# Patient Record
Sex: Female | Born: 1955 | Race: White | Hispanic: No | Marital: Single | Smoking: Never smoker
Health system: Southern US, Community
[De-identification: ages and names within clinical notes are randomized; demographics above are authoritative.]

## PROBLEM LIST (undated history)

## (undated) DIAGNOSIS — N189 Chronic kidney disease, unspecified: Secondary | ICD-10-CM

## (undated) DIAGNOSIS — D509 Iron deficiency anemia, unspecified: Secondary | ICD-10-CM

## (undated) DIAGNOSIS — E079 Disorder of thyroid, unspecified: Secondary | ICD-10-CM

## (undated) DIAGNOSIS — G8929 Other chronic pain: Secondary | ICD-10-CM

## (undated) DIAGNOSIS — E785 Hyperlipidemia, unspecified: Secondary | ICD-10-CM

## (undated) DIAGNOSIS — M1990 Unspecified osteoarthritis, unspecified site: Secondary | ICD-10-CM

## (undated) HISTORY — PX: CHOLECYSTECTOMY: SHX55

## (undated) HISTORY — PX: JOINT REPLACEMENT: SHX530

## (undated) HISTORY — DX: Disorder of thyroid, unspecified: E07.9

## (undated) HISTORY — DX: Chronic kidney disease, unspecified: N18.9

## (undated) HISTORY — DX: Iron deficiency anemia, unspecified: D50.9

## (undated) HISTORY — DX: Other chronic pain: G89.29

## (undated) HISTORY — DX: Hyperlipidemia, unspecified: E78.5

## (undated) HISTORY — DX: Unspecified osteoarthritis, unspecified site: M19.90

---

## 2003-02-05 ENCOUNTER — Encounter: Payer: Self-pay | Admitting: Orthopedic Surgery

## 2003-02-09 ENCOUNTER — Encounter: Payer: Self-pay | Admitting: Orthopedic Surgery

## 2003-02-09 ENCOUNTER — Inpatient Hospital Stay (HOSPITAL_COMMUNITY): Admission: RE | Admit: 2003-02-09 | Discharge: 2003-02-12 | Payer: Self-pay | Admitting: Orthopedic Surgery

## 2006-04-27 ENCOUNTER — Inpatient Hospital Stay (HOSPITAL_COMMUNITY): Admission: EM | Admit: 2006-04-27 | Discharge: 2006-04-29 | Payer: Self-pay | Admitting: *Deleted

## 2006-04-28 ENCOUNTER — Encounter (INDEPENDENT_AMBULATORY_CARE_PROVIDER_SITE_OTHER): Payer: Self-pay | Admitting: *Deleted

## 2006-09-19 ENCOUNTER — Encounter: Admission: RE | Admit: 2006-09-19 | Discharge: 2006-09-19 | Payer: Self-pay | Admitting: *Deleted

## 2006-09-25 ENCOUNTER — Encounter (INDEPENDENT_AMBULATORY_CARE_PROVIDER_SITE_OTHER): Payer: Self-pay | Admitting: *Deleted

## 2006-09-25 ENCOUNTER — Ambulatory Visit (HOSPITAL_COMMUNITY): Admission: RE | Admit: 2006-09-25 | Discharge: 2006-09-25 | Payer: Self-pay | Admitting: *Deleted

## 2007-07-18 ENCOUNTER — Encounter: Admission: RE | Admit: 2007-07-18 | Discharge: 2007-07-18 | Payer: Self-pay | Admitting: Endocrinology

## 2007-08-10 ENCOUNTER — Emergency Department (HOSPITAL_COMMUNITY): Admission: EM | Admit: 2007-08-10 | Discharge: 2007-08-10 | Payer: Self-pay | Admitting: Emergency Medicine

## 2007-08-26 ENCOUNTER — Inpatient Hospital Stay (HOSPITAL_COMMUNITY): Admission: EM | Admit: 2007-08-26 | Discharge: 2007-08-29 | Payer: Self-pay | Admitting: Emergency Medicine

## 2008-01-10 ENCOUNTER — Encounter (INDEPENDENT_AMBULATORY_CARE_PROVIDER_SITE_OTHER): Payer: Self-pay | Admitting: *Deleted

## 2008-01-10 ENCOUNTER — Ambulatory Visit (HOSPITAL_COMMUNITY): Admission: RE | Admit: 2008-01-10 | Discharge: 2008-01-10 | Payer: Self-pay | Admitting: *Deleted

## 2008-04-08 ENCOUNTER — Encounter: Admission: RE | Admit: 2008-04-08 | Discharge: 2008-04-08 | Payer: Self-pay | Admitting: *Deleted

## 2010-07-17 ENCOUNTER — Encounter: Payer: Self-pay | Admitting: *Deleted

## 2010-08-14 ENCOUNTER — Emergency Department (HOSPITAL_COMMUNITY): Payer: Medicare Other

## 2010-08-14 ENCOUNTER — Inpatient Hospital Stay (HOSPITAL_COMMUNITY)
Admission: EM | Admit: 2010-08-14 | Discharge: 2010-08-22 | DRG: 312 | Disposition: A | Discharge status: Skilled Nursing Facility | Payer: Medicare Other | Attending: Family Medicine | Admitting: Family Medicine

## 2010-08-14 DIAGNOSIS — F70 Mild intellectual disabilities: Secondary | ICD-10-CM | POA: Diagnosis present

## 2010-08-14 DIAGNOSIS — M545 Low back pain, unspecified: Secondary | ICD-10-CM | POA: Diagnosis present

## 2010-08-14 DIAGNOSIS — R55 Syncope and collapse: Principal | ICD-10-CM | POA: Diagnosis present

## 2010-08-14 DIAGNOSIS — M810 Age-related osteoporosis without current pathological fracture: Secondary | ICD-10-CM | POA: Diagnosis present

## 2010-08-14 DIAGNOSIS — E876 Hypokalemia: Secondary | ICD-10-CM | POA: Diagnosis present

## 2010-08-14 DIAGNOSIS — E860 Dehydration: Secondary | ICD-10-CM | POA: Diagnosis present

## 2010-08-14 DIAGNOSIS — E039 Hypothyroidism, unspecified: Secondary | ICD-10-CM | POA: Diagnosis present

## 2010-08-14 DIAGNOSIS — D649 Anemia, unspecified: Secondary | ICD-10-CM | POA: Diagnosis present

## 2010-08-14 DIAGNOSIS — R634 Abnormal weight loss: Secondary | ICD-10-CM | POA: Diagnosis present

## 2010-08-14 DIAGNOSIS — N179 Acute kidney failure, unspecified: Secondary | ICD-10-CM | POA: Diagnosis present

## 2010-08-14 DIAGNOSIS — D696 Thrombocytopenia, unspecified: Secondary | ICD-10-CM | POA: Diagnosis present

## 2010-08-14 DIAGNOSIS — E785 Hyperlipidemia, unspecified: Secondary | ICD-10-CM | POA: Diagnosis present

## 2010-08-14 LAB — CK TOTAL AND CKMB (NOT AT ARMC)
CK, MB: 17.7 ng/mL (ref 0.3–4.0)
Relative Index: 4.5 — ABNORMAL HIGH (ref 0.0–2.5)
Total CK: 420 U/L — ABNORMAL HIGH (ref 7–177)
Total CK: 395 U/L — ABNORMAL HIGH (ref 7–177)

## 2010-08-14 LAB — URINALYSIS, ROUTINE W REFLEX MICROSCOPIC
Hgb urine dipstick: NEGATIVE
Ketones, ur: 40 mg/dL — AB
Nitrite: NEGATIVE
Protein, ur: NEGATIVE mg/dL
Urine Glucose, Fasting: NEGATIVE mg/dL
Urobilinogen, UA: 0.2 mg/dL (ref 0.0–1.0)
pH: 6 (ref 5.0–8.0)

## 2010-08-14 LAB — DIFFERENTIAL
Eosinophils Absolute: 0 10*3/uL (ref 0.0–0.7)
Eosinophils Relative: 0 % (ref 0–5)
Lymphocytes Relative: 14 % (ref 12–46)
Lymphs Abs: 1 10*3/uL (ref 0.7–4.0)

## 2010-08-14 LAB — TROPONIN I: Troponin I: 0.09 ng/mL — ABNORMAL HIGH (ref 0.00–0.06)

## 2010-08-14 LAB — COMPREHENSIVE METABOLIC PANEL
AST: 83 U/L — ABNORMAL HIGH (ref 0–37)
Albumin: 4.2 g/dL (ref 3.5–5.2)
Calcium: 9.5 mg/dL (ref 8.4–10.5)
GFR calc Af Amer: 51 mL/min — ABNORMAL LOW (ref >60)
GFR calc non Af Amer: 42 mL/min — ABNORMAL LOW (ref >60)
Potassium: 2.9 mEq/L — ABNORMAL LOW (ref 3.5–5.1)

## 2010-08-14 LAB — POCT CARDIAC MARKERS: CKMB, poc: 9.8 ng/mL (ref 1.0–8.0)

## 2010-08-14 LAB — SAMPLE TO BLOOD BANK

## 2010-08-14 LAB — PROTIME-INR
INR: 1.07 (ref 0.00–1.49)
Prothrombin Time: 14.1 seconds (ref 11.6–15.2)

## 2010-08-14 LAB — RAPID URINE DRUG SCREEN, HOSP PERFORMED
Cocaine: NOT DETECTED
Opiates: NOT DETECTED

## 2010-08-14 LAB — CBC
HCT: 32.1 % — ABNORMAL LOW (ref 36.0–46.0)
MCH: 30 pg (ref 26.0–34.0)
MCHC: 35.8 g/dL (ref 30.0–36.0)
RBC: 3.83 MIL/uL — ABNORMAL LOW (ref 3.87–5.11)
RDW: 12.9 % (ref 11.5–15.5)

## 2010-08-15 ENCOUNTER — Inpatient Hospital Stay (HOSPITAL_COMMUNITY): Payer: Medicare Other

## 2010-08-15 LAB — CBC
HCT: 26.1 % — ABNORMAL LOW (ref 36.0–46.0)
MCHC: 34.5 g/dL (ref 30.0–36.0)
Platelets: 133 10*3/uL — ABNORMAL LOW (ref 150–400)
RDW: 13.2 % (ref 11.5–15.5)
WBC: 5.5 10*3/uL (ref 4.0–10.5)

## 2010-08-15 LAB — COMPREHENSIVE METABOLIC PANEL
ALT: 56 U/L — ABNORMAL HIGH (ref 0–35)
AST: 67 U/L — ABNORMAL HIGH (ref 0–37)
Albumin: 3 g/dL — ABNORMAL LOW (ref 3.5–5.2)
Calcium: 8.1 mg/dL — ABNORMAL LOW (ref 8.4–10.5)
Chloride: 118 mEq/L — ABNORMAL HIGH (ref 96–112)
Creatinine, Ser: 1.01 mg/dL (ref 0.4–1.2)
GFR calc Af Amer: >60 mL/min (ref >60)
Sodium: 143 mEq/L (ref 135–145)

## 2010-08-15 LAB — BRAIN NATRIURETIC PEPTIDE: Pro B Natriuretic peptide (BNP): 246 pg/mL — ABNORMAL HIGH (ref 0.0–100.0)

## 2010-08-15 LAB — CARDIAC PANEL(CRET KIN+CKTOT+MB+TROPI)
CK, MB: 11.9 ng/mL (ref 0.3–4.0)
Relative Index: 3.6 — ABNORMAL HIGH (ref 0.0–2.5)
Relative Index: 3.2 — ABNORMAL HIGH (ref 0.0–2.5)
Total CK: 395 U/L — ABNORMAL HIGH (ref 7–177)
Troponin I: 0.37 ng/mL — ABNORMAL HIGH (ref 0.00–0.06)
Troponin I: 0.19 ng/mL — ABNORMAL HIGH (ref 0.00–0.06)

## 2010-08-15 LAB — IRON AND TIBC
Iron: 69 ug/dL (ref 42–135)
TIBC: 248 ug/dL — ABNORMAL LOW (ref 250–470)

## 2010-08-15 LAB — FERRITIN: Ferritin: 338 ng/mL — ABNORMAL HIGH (ref 10–291)

## 2010-08-15 LAB — PHOSPHORUS: Phosphorus: 1.2 mg/dL — ABNORMAL LOW (ref 2.3–4.6)

## 2010-08-15 LAB — URINE CULTURE
Colony Count: NO GROWTH
Culture  Setup Time: 201202192344
Culture: NO GROWTH

## 2010-08-15 LAB — LIPID PANEL
Triglycerides: 98 mg/dL (ref <150)
VLDL: 20 mg/dL (ref 0–40)

## 2010-08-15 LAB — FOLATE: Folate: >20 ng/mL

## 2010-08-15 LAB — TSH: TSH: 8.219 u[IU]/mL — ABNORMAL HIGH (ref 0.350–4.500)

## 2010-08-16 ENCOUNTER — Inpatient Hospital Stay (HOSPITAL_COMMUNITY): Payer: Medicare Other

## 2010-08-16 LAB — COMPREHENSIVE METABOLIC PANEL
AST: 63 U/L — ABNORMAL HIGH (ref 0–37)
CO2: 20 mEq/L (ref 19–32)
Calcium: 7.9 mg/dL — ABNORMAL LOW (ref 8.4–10.5)
Creatinine, Ser: 0.81 mg/dL (ref 0.4–1.2)
GFR calc Af Amer: >60 mL/min (ref >60)
GFR calc non Af Amer: >60 mL/min (ref >60)
Total Protein: 5.3 g/dL — ABNORMAL LOW (ref 6.0–8.3)

## 2010-08-16 LAB — CARDIAC PANEL(CRET KIN+CKTOT+MB+TROPI)
CK, MB: 7.4 ng/mL (ref 0.3–4.0)
Relative Index: 3 — ABNORMAL HIGH (ref 0.0–2.5)

## 2010-08-16 LAB — CBC
Hemoglobin: 9.4 g/dL — ABNORMAL LOW (ref 12.0–15.0)
MCH: 30.4 pg (ref 26.0–34.0)
MCHC: 33.8 g/dL (ref 30.0–36.0)
Platelets: 139 10*3/uL — ABNORMAL LOW (ref 150–400)
RDW: 13.9 % (ref 11.5–15.5)

## 2010-08-16 LAB — CLOSTRIDIUM DIFFICILE BY PCR: Toxigenic C. Difficile by PCR: NEGATIVE

## 2010-08-16 MED ORDER — IOHEXOL 300 MG/ML  SOLN
80.0000 mL | Freq: Once | INTRAMUSCULAR | Status: AC | PRN
  Administered 2010-08-16: 80 mL via INTRAVENOUS

## 2010-08-17 ENCOUNTER — Inpatient Hospital Stay (HOSPITAL_COMMUNITY): Payer: Medicare Other

## 2010-08-17 LAB — COMPREHENSIVE METABOLIC PANEL
ALT: 73 U/L — ABNORMAL HIGH (ref 0–35)
AST: 70 U/L — ABNORMAL HIGH (ref 0–37)
Albumin: 2.9 g/dL — ABNORMAL LOW (ref 3.5–5.2)
Alkaline Phosphatase: 90 U/L (ref 39–117)
CO2: 23 mEq/L (ref 19–32)
Chloride: 112 mEq/L (ref 96–112)
GFR calc Af Amer: >60 mL/min (ref >60)
GFR calc non Af Amer: >60 mL/min (ref >60)
Potassium: 3.9 mEq/L (ref 3.5–5.1)
Total Bilirubin: 0.4 mg/dL (ref 0.3–1.2)

## 2010-08-17 LAB — FERRITIN: Ferritin: 286 ng/mL (ref 10–291)

## 2010-08-17 LAB — IRON AND TIBC
Iron: 109 ug/dL (ref 42–135)
TIBC: 222 ug/dL — ABNORMAL LOW (ref 250–470)

## 2010-08-17 LAB — T3, FREE: T3, Free: 1.9 pg/mL — ABNORMAL LOW (ref 2.3–4.2)

## 2010-08-17 LAB — CROSSMATCH
ABO/RH(D): A POS
Antibody Screen: NEGATIVE

## 2010-08-17 LAB — VITAMIN B12: Vitamin B-12: 1223 pg/mL — ABNORMAL HIGH (ref 211–911)

## 2010-08-17 LAB — CBC
Hemoglobin: 11.2 g/dL — ABNORMAL LOW (ref 12.0–15.0)
Platelets: 147 10*3/uL — ABNORMAL LOW (ref 150–400)
RBC: 3.77 MIL/uL — ABNORMAL LOW (ref 3.87–5.11)
WBC: 6 10*3/uL (ref 4.0–10.5)

## 2010-08-17 LAB — FOLATE: Folate: >20 ng/mL

## 2010-08-17 NOTE — Consult Note (Signed)
Shawna [name], Shawna [name]                  ACCOUNT NO.:  [id number]  MEDICAL RECORD NO.:  [id number]           PATIENT TYPE:  I  LOCATION:  [id number]                         FACILITY:  [facility]  PHYSICIAN:  Mali Ariana Cavenaugh, MD         DATE OF BIRTH:  10-22-1955  DATE OF CONSULTATION:  08/14/2010 DATE OF DISCHARGE:  08/14/2010                                CONSULTATION   HISTORY OF PRESENT ILLNESS:  Shawna [name] is a 55 year old female who is followed by Dr. Wilson Singer.  She has no prior history of coronary disease and no prior history of any cardiac workup.  We are asked to see her today for syncope with positive cardiac markers.  Ms. Stukey was admitted on August 14, 2010 after syncope and collapse at home.  It appears she is dehydrated.  She is mentally retarded, her last MRI in 2009 showed advanced nonspecific volume loss.  She has not been eating or drinking well for the last several days.  The nurse notes since she has been in the hospital, she has also had 3 episodes of diarrhea.  Her hemoglobin was 11.5 on admission and dropped to 9.0 with hydration.  BUN was 31, creatinine 1.32.  The patient denies any chest pain or palpitations or shortness of breath.  It is interesting to note that her initial point- of-care markers were negative x2, her CK was 420 with 19 MBs, and subsequent troponins were 0.09 and 0.37.  An EKG has not been done yet. The patient was documented to have orthostatic blood pressure changes.  PAST MEDICAL HISTORY:  Remarkable for mild mental retardation as noted above.  She has DJD.  She has had hip surgery done on the right twice, 1988 with a revision in August 2004.  She has DJD in her back and has chronic back pain.  She has a history of AVMs and polyps, her last endoscopy and colonoscopy was in July 2009 by Dr. Lajoyce Corners.  She was admitted in 2009 in the spring with similar episode of orthostatic hypotension, at that time she was put on Florinef.  She has had a history of  prior outpatient pneumonia treated in March 2009.  She has treated hypothyroidism.  She had laparoscopic cholecystectomy in November 2007.  MEDICATIONS:  Her home medications according to the HPI are 1. Celebrex 200 mg a day. 2. Sertraline p.r.n. 3. Synthroid 0.075 mg a day. 4. Tramadol.  ALLERGIES:  She has no known drug allergies.  SOCIAL HISTORY:  She has no children.  She does not smoke.  She tells me that she lives with her brother and his wife.  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  FAMILY HISTORY:  Unknown.  REVIEW OF SYSTEMS:  According to the history and physical, she lost 13 pounds in the last month.  She denies any melena, fever, or chills.  She has not had hemoptysis.  She has not had a history of blood clots.  PHYSICAL EXAMINATION:  VITAL SIGNS:  Blood pressure lying flat is 92/58, pulse 88, temperature 98.9.  GENERAL:  She is a pale, thin, chronically ill-appearing female in no acute distress. HEENT:  Normocephalic.  She has poor dentition. NECK:  Without JVD or bruit.  Thyroid is not enlarged. CHEST:  Reveals pectus.  She has diminished breath sounds, but no rales. CARDIAC:  Diminished heart sounds with regular rate and rhythm.  No obvious murmur, rub, or gallop. ABDOMEN:  Nontender, nondistended.  No obvious hepatosplenomegaly. EXTREMITIES:  Without edema.  Distal pulses are faint, but intact. NEUROLOGIC:  Exam is grossly intact.  She is awake, alert, oriented, and cooperative and answers questions appropriately. SKIN:  Cool and dry.  LABORATORY DATA:  White count 5.5, hemoglobin is 9.0, hematocrit 26.1, platelets 133 after hydration.  On admission, her sodium was 136, potassium 2.9, BUN 31, creatinine 1.32.  LFTs were elevated on admission at 83 and 75.  Point-of-care markers as noted are negative x2, but her CK MBs peaked at 420 with 19.8 MB with a relative index of 4.7 and subsequent troponin Is were 0.09 and 0.37.   Total cholesterol is 214 with a LD of 149.  There is no EKG available at this time.  A CT of her abdomen revealed nonspecific bowel gas pattern, no free air.  IMPRESSION: 1. Orthostatic syncope. 2. Dehydration. 3. Elevated cardiac markers of unclear significance. 4. Dyslipidemia. 5. Mild mental retardation 6. Degenerative joint disease with bilateral hip surgeries x2 and     chronic back pain. 7. History of arteriovenous malformation and polyps, colonoscopy and     endoscopy were done last in July 2009. 8. Treated hypothyroidism.  PLAN:  The patient will be seen by Dr. Debara Pickett, further recommendations to follow.  We will see if we can find her emergency room EKG if not repeated.  We will also repeat a BNP, I believe this has already been ordered.  Echocardiogram will be ordered as well.     Erlene Quan, P.A.   ______________________________ Mali Haedyn Breau, MD    LKK/MEDQ  D:  08/15/2010  T:  08/15/2010  Job:  [id number]  cc:   Mali Sharay Bellissimo, MD Ronaldo Miyamoto, M.D.  Electronically Signed by Kerin Ransom P.A. on 08/15/2010 03:33:29 PM Electronically Signed by Raliegh Ip. Wavie Hashimi M.D. on 08/17/2010 08:04:37 AM

## 2010-08-18 LAB — COMPREHENSIVE METABOLIC PANEL
Alkaline Phosphatase: 92 U/L (ref 39–117)
BUN: 5 mg/dL — ABNORMAL LOW (ref 6–23)
Creatinine, Ser: 0.8 mg/dL (ref 0.4–1.2)
Glucose, Bld: 97 mg/dL (ref 70–99)
Potassium: 3.9 mEq/L (ref 3.5–5.1)
Total Protein: 5.3 g/dL — ABNORMAL LOW (ref 6.0–8.3)

## 2010-08-18 LAB — CBC
HCT: 34.3 % — ABNORMAL LOW (ref 36.0–46.0)
MCH: 29.9 pg (ref 26.0–34.0)
MCHC: 34.4 g/dL (ref 30.0–36.0)
MCV: 87.1 fL (ref 78.0–100.0)
RDW: 14.7 % (ref 11.5–15.5)
WBC: 7.1 10*3/uL (ref 4.0–10.5)

## 2010-08-19 ENCOUNTER — Inpatient Hospital Stay (HOSPITAL_COMMUNITY): Payer: Medicare Other

## 2010-08-20 ENCOUNTER — Inpatient Hospital Stay (HOSPITAL_COMMUNITY): Payer: Medicare Other

## 2010-08-20 LAB — COMPREHENSIVE METABOLIC PANEL
ALT: 68 U/L — ABNORMAL HIGH (ref 0–35)
AST: 49 U/L — ABNORMAL HIGH (ref 0–37)
CO2: 30 mEq/L (ref 19–32)
Calcium: 8.2 mg/dL — ABNORMAL LOW (ref 8.4–10.5)
GFR calc Af Amer: >60 mL/min (ref >60)
GFR calc non Af Amer: >60 mL/min (ref >60)
Potassium: 3 mEq/L — ABNORMAL LOW (ref 3.5–5.1)
Sodium: 142 mEq/L (ref 135–145)
Total Protein: 5.5 g/dL — ABNORMAL LOW (ref 6.0–8.3)

## 2010-08-21 LAB — COMPREHENSIVE METABOLIC PANEL
ALT: 68 U/L — ABNORMAL HIGH (ref 0–35)
AST: 62 U/L — ABNORMAL HIGH (ref 0–37)
CO2: 25 mEq/L (ref 19–32)
Calcium: 8.6 mg/dL (ref 8.4–10.5)
Chloride: 105 mEq/L (ref 96–112)
GFR calc Af Amer: >60 mL/min (ref >60)
GFR calc non Af Amer: >60 mL/min (ref >60)
Potassium: 3.5 mEq/L (ref 3.5–5.1)
Sodium: 141 mEq/L (ref 135–145)

## 2010-09-01 NOTE — H&P (Signed)
Shawna [name], Shawna [name]                  ACCOUNT NO.:  [id number]  MEDICAL RECORD NO.:  [id number]           PATIENT TYPE:  E  LOCATION:  [facility]                         FACILITY:  [facility]  PHYSICIAN:  Barbette Merino, M.D.      DATE OF BIRTH:  Jun 22, 1956  DATE OF ADMISSION:  08/14/2010 DATE OF DISCHARGE:  08/14/2010                             HISTORY & PHYSICAL   PRIMARY CARE PHYSICIAN:  Dr. Anda Kraft.  PRESENTING COMPLAINT:  Passing out.  HISTORY OF PRESENT ILLNESS:  The patient is a 55 year old female with history of degenerative disk disease and osteoporosis, who has apparently been doing all right until about 4 days ago when she started having excessive back pain.  She was trying to get her orthopedic surgeon but before she could get there she started feeling weak and dizzy.  The patient has stopped eating or drinking in the last couple of days.  She passed out today, hence she was brought in by her family. She has had multiple episodes of syncopal episode in the past.  Her last admission was in March 2009 at which point she was orthostatic and was started on fludrocortisone.  Since then she has been doing fine until this event.  Per family, unable to stand after she started having the back pain.  In the ED, she was found to be orthostatic as well.  The patient also was reported to have loss of 13 pounds in the last 1 month. Denied any fever, she had 1 episode of vomiting, but no diarrhea. Denied any pain besides the back pain.  Her passing out today with abrupt.  As she has been dizzy.  No further episodes have happened since that one episode.  She is, however, still dizzy when she gets up.  Per family when she passed out she actually fell on the floor.  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  History of pneumonia, degenerative disk disease, multiple colon polyps, last colonoscopy reported in 2009, history of AVM of the stomach, chronic normocytic anemia.  She is status post laparoscopic cholecystectomy in November 2007 and status post total right hip replacement also twice before.  ALLERGIES:  She has no known drug allergies.  CURRENT MEDICATIONS: 1. Celebrex 200 mg daily. 2. Sertraline 100 mg p.r.n. 3. Synthroid 75 mcg daily. 4. Tramadol 50 mg daily.  SOCIAL HISTORY:  The patient lives in [place], alone.  She is single.  She has siblings live with her in the same property.  Her brother is here with her, his name is Markeshia Manbeck and he is the primary care giver.  He is here with his wife, telephone number of [phone]. She has mild mental retardation, but there was able to complete high school, currently unemployed.  No tobacco, alcohol, or IV drug use.  FAMILY HISTORY:  She is adopted and therefore full history is unknown.  REVIEW OF SYSTEMS:  All her systems reviewed  and are currently negative except per HPI.  PHYSICAL EXAMINATION:  VITAL SIGNS:  Temperature is 97.8, blood pressure initially 129/73 with a pulse of 95, respiratory rate 16, sats 100% on room air.  Orthostatics blood pressure, systolic dropping from [id number] from lying to sitting, diastolic from 67 to 37 .  Her pulse rate jumped from 95-108. GENERAL:  The patient is looks chronically ill-looking, small frame, cachectic, but no obvious acute distress. HEENT:  PERRL.  EOMI.  She has sunken eyes with dry mucous membranes. NECK:  Supple.  No visible JVD.  No lymphadenopathy. RESPIRATORY:  She has good air entry bilaterally.  No wheezes or rales. CARDIOVASCULAR SYSTEM:  She has S1 and S2, no audible murmur. ABDOMEN:  Scaphoid, nontender with positive bowel sounds. EXTREMITIES:  She has no edema, cyanosis, or  clubbing. MUSCULOSKELETAL:  The patient has a very tender spot in the mid back. She also has evidence of arthritis in her hips. SKIN:  No visible rashes or ulcers.  LABORATORY DATA:  Her white count is 7.0, hemoglobin 11.5 with an MCV of 83.8.  Her platelet count is 193.  Sodium 136, potassium 2.9, chloride 102, CO2 of 22, glucose 91, BUN 31, creatinine 1.32 with a decreased GFR of 51.  Total bilirubin is 0.6, alkaline phosphatase is 127, AST 83, ALT 75, total protein 7.3, albumin 4.2, calcium 9.5.  Her CK is 420 with an MB of 19.8, index of 4.7, but troponin I was negative.  Urinalysis also negative except for some ketones.  Her urine drug screen is negative. PT is 14.1, INR 1.07, PTT of 26.  Abdominal x-ray showed no obstructive bowel gas pattern.  No free air.  No acute cardiopulmonary disease.  ASSESSMENT:  This is a 55 year old female presenting with syncopal episode and is very orthostatic.  It appears the patient may be dehydrated.  She has an acute renal insufficiency pattern.  All of this may have been related to poor oral intake as a result of her ongoing pain.  There was supposed autonomic dysfunction 3 years ago which could still be playing a role.  PLAN: 1. Syncope.  We will admit the patient and hydrate her aggressively,     recheck orthostatics in the morning on daily.  I will check     cortisol level in a.m.  Once she is fully hydrated and if she is     orthostatic, we will restart Florinef.  I will check her thyroid     function as well to make sure this may not be responsible for her     symptoms. 2. Hypokalemia.  Again the cause is not entirely clear but may be     dehydration.  She denied any nausea or vomiting.  We may have to     look at autonomic cause as well.  I will replete her potassium and     add potassium to her IV fluids. 3. Osteoporosis.  The patient has been diagnosed with osteoporosis,     although I do not have any T-score at this point.  But we  will     handle that with care with fall precaution to avoid further     fracture. 4. Degenerative disk disease.  She has a very tender spot at the back     which may reflect a site of some compression fracture which may be     the cause of her pain.  I will check an MRI of her lumbar spine  including the upper thoracic spine.  Depending on what the MRI     shows we will proceed with further treatment. 5. Increased LFTs, probably from dehydration and starvation.  We will     follow her liver function closely. 6. Mild rhabdo, her CPK is elevated probably from the fall.  We will     hydrate her and follow her CPK levels. 7. Chronic anemia probably from thyroid disease on poor oral intake     and I will check anemia panel. 8. Weight loss.  Again this may be related to her poor oral intake.     We will follow the patient closely and get PT, OT and other care.     Hopefully another consult for home care.  Hopefully, the patient     will be able to start eating adequately.     Barbette Merino, M.D.     [place]  D:  08/14/2010  T:  08/14/2010  Job:  [id number]  Electronically Signed by Barbette Merino M.D. on 09/01/2010 06:31:00 AM

## 2010-09-01 NOTE — Discharge Summary (Signed)
  Shawna [name], Shawna [name]                  ACCOUNT NO.:  [id number]  MEDICAL RECORD NO.:  [id number]           PATIENT TYPE:  I  LOCATION:  [id number]                         FACILITY:  [facility]  PHYSICIAN:  Murray Hodgkins, MD    DATE OF BIRTH:  1955/09/03  DATE OF ADMISSION:  08/14/2010 DATE OF DISCHARGE:  08/14/2010                              DISCHARGE SUMMARY   ADDENDUM:  DISCHARGE MEDICATIONS:  Clarification on #6 of original dictation. Should be oxycodone sustained release 10 mg p.o. every 12 hours.  Original dictation includes potassium biphosphate 250 mg p.o. b.i.d. x6 doses, last dose to be on August 22, 2010, at 10 p.m.  We will discontinue this medication.     Shawna Gunning, NP   ______________________________ Murray Hodgkins, MD    KMB/MEDQ  D:  08/22/2010  T:  08/22/2010  Job:  [id number]  Electronically Signed by Murray Hodgkins  on 08/31/2010 09:10:55 PM

## 2010-09-01 NOTE — Discharge Summary (Signed)
Shawna [name], Shawna [name]                  ACCOUNT NO.:  [id number]  MEDICAL RECORD NO.:  [id number]           PATIENT TYPE:  LOCATION:                                 FACILITY:  PHYSICIAN:  Murray Hodgkins, MD    DATE OF BIRTH:  27-Oct-1955  DATE OF ADMISSION:  08/14/2010 DATE OF DISCHARGE:  08/22/2010                              DISCHARGE SUMMARY   PRIMARY CARE PROVIDER:  Ronaldo Miyamoto, MD  DISCHARGE DIAGNOSES: 1. Syncope secondary to orthostasis. 2. Acute on chronic orthostasis. 3. Thoracic back pain. 4. Mild elevation of AST/ALT. 5. Acute renal failure. 6. Status post elevated cardiac enzymes. 7. Hypokalemia. 8. Hypophosphatemia.  DISCHARGE MEDICATIONS: 1. Acetaminophen 650 mg p.o. every 4 hours as needed. 2. Aspirin 81 mg p.o. daily. 3. Florinef 0.1 mg p.o. b.i.d. 4. Levothyroxine 100 mcg p.o. daily. 5. Magnesium oxide 400 mg p.o. b.i.d. x6 doses, last dose on August 24, 2010, at 10 a.m. 6. Oxycodone p.o. every 12 hours. 7. Oxycodone IR 10 mg p.o. every 4 hours as needed for severe pain. 8. Potassium biphosphate 250 mg p.o. b.i.d. x6 doses, last dose on     August 22, 2010, at 10 p.m. 9. Potassium chloride 40 mEq p.o. daily. 10.Calcium carbonate/vitamin D over-the-counter 1 tablet p.o. b.i.d. 11.Multivitamin 1 tablet p.o. daily.  LABORATORY DATA:  On August 14, 2010, WBC 7.0, hemoglobin 11.5, hematocrit 32.1, neutrophils 78%, absolute neutrophils 5.5.  Sodium 136, potassium 2.9, chloride 102, CO2 of 22, BUN 31, creatinine 1.32, glucose 91, alk phos 127, AST 83, ALT 75.  PTT 26, PT 14.1, INR 1.07.  CK-MB 15.0, troponin I less than 0.05, myoglobin greater than 500.  Urinalysis negative.  Urine drug screen negative.  Total CK 420, CK-MB 19.8, troponin-I 0.09.  Next set CK-MB 9.8, troponin I less than 0.05, myoglobin greater than 500.  Cholesterol 214, LDL 149.  Total CK 395, CK- MB 14.3, and troponin-I 0.37 on August 15, 2010.  Cortisol 16.7.  TSH 8.21.  TIBC  248, vitamin B12 of 1500, folate greater than 20.0 and ferritin 338.  Total CK 368, CK-MB 11.9, and troponin-I 0.25. Phosphorus 1.2, magnesium 1.5.  BNP 246.  Urine culture showed no growth.  C. difficile by PCR negative.  On August 21, 2010, sodium was 141, potassium 3.5, chloride 105, CO2 of 25, BUN 7, creatinine 0.82, glucose 91, alk phos 119, AST 62, ALT 68, total protein 5.3, albumin 2.9, magnesium 1.4 on August 21, 2010.  Phosphorus 3.6 on August 21, 2010.  RADIOLOGICAL DATA:  Abdominal x-ray of August 14, 2010, showed nonobstructive bowel gas pattern, no free air.  No acute cardiopulmonary disease.  CT of the abdomen and pelvis with contrast on August 16, 2010, yields no evidence of abdominal aortic aneurysm.  No acute abdominal/pelvic findings.  Thoracic spine, 2 view, on August 18, 2010, was negative.  MRI of thoracic spine without contrast, normal examination of thoracic spine.  No traumatic findings.  No cause of back pain identified.  CT of the head without contrast done on August 19, 2010, stable advanced cerebral volume loss.  No acute intracranial abnormality.  Abdominal ultrasound on August 21, 2010, yields no acute findings, status post cholecystectomy.  PROCEDURES:  Two-D echocardiogram yields an ejection fraction of 60-65%. Left ventricular diastolic function parameters were normal.  CONSULTS:  Mali Hilty, MD, with Cardiology.  BRIEF SUMMARY:  Shawna [name] is a 55 year old female with a history of degenerative disk disease and osteoporosis, who developed worsening/excessive back pain around August 10, 2010.  She came to the emergency room on August 14, 2010, and reported that she was trying to get an orthopedic surgeon for an appointment, but before she could get that accomplished she began feeling weak and dizzy.  The patient reported that she had stopped eating and drinking for the last 24-48 hours secondary to the pain.  On August 14, 2010, she  reported to the emergency room physician that she had passed out and fell.  She was brought in by her family.  Of note, she has had multiple episodes of syncope in the past.  Her last admission was March 2009, at which point she was orthostatic and was started on fludrocortisone.  Since then, she had been doing fine until this event.  Per family, she was unable to stand after she started having the back pain.  Family also reported a 13- pound weight loss in the last month.  The patient was admitted to telemetry floor for further evaluation and treatment of syncope, hypokalemia, and back pain.  HOSPITAL COURSE BY PROBLEM: 1. Syncope secondary to orthostasis:  Currently resolved.  The patient     was admitted to telemetry floor, was given IV fluids.  She had a 2-     D echo as noted above.  Cardiology consult was obtained.  The     patient monitored on telemetry and remained in sinus rhythm.     Orthostatics confirmed orthostasis.  The patient was continued on     Florinef.  She had PT and OT.  Orthostatics improved with fluids.     The patient had no syncopal events during her hospitalization.     Recommending that she ambulate with assistance only.  If condition     persists or worsens, may consider adding midodrine. 2. Acute on chronic orthostasis:  See above.  Ambulate with assistance     only.  Continue Florinef.  Other options to consider midodrine. 3. Thoracic back pain:  Nonfocal neurological exam.  Imaging was     negative.  The patient continued to complain of thoracic back pain     during her hospitalization, although the pain did not deter her     mobility.  Treatment is pain control and PT.  The patient has no     cervical or lumbar pain. 4. Mild elevation of AST/ALT:  Currently stable, chronic intermittent.     Ultrasound of the abdomen was negative.  Not on statin or any meds. 5. Acute renal failure, resolved. 6. Status post elevated cardiac enzymes:  The patient  received     cardiology consult who recommended medical treatment.  The patient     cannot tolerate beta-blocker secondary to blood pressure.  Continue     aspirin. 7. Hypokalemia:  The patient's potassium was repleted and is within     the limits of normal upon discharge. 8. Hypophosphatemia earlier on admission:  The patient received     repletion during hospitalization.  At the time of discharge,     phosphorus level 3.6.  PHYSICAL EXAMINATION:  GENERAL:  Awake and  alert, in no acute distress. CARDIOVASCULAR:  Regular rate and rhythm.  No murmur, gallop, or rub. No lower extremity edema. RESPIRATORY:  Breath sounds clear to auscultation bilaterally.  No rhonchi, wheezes, or rales. ABDOMEN:  Flat and soft.  Positive bowel sounds throughout.  Nontender to palpation. MUSCULOSKELETAL:  Moves all extremities.  Full range of motion.  The patient's mobility is not impaired by her thoracic back pain.  The patient does experience tenderness on exam in the thoracic spine, right of midline about T4.  DISPOSITION:  The patient is being discharged to [facility].  We will continue PT and pain control for her back pain.  CONDITION AT DISCHARGE:  The patient is medically stable and ready for discharge to facility.  TIME SPENT:  45 minutes.     Radene Gunning, NP   ______________________________ Murray Hodgkins, MD    KMB/MEDQ  D:  08/22/2010  T:  08/22/2010  Job:  [id number]  cc:   Ronaldo Miyamoto, M.D.  Electronically Signed by Murray Hodgkins  on 08/31/2010 09:10:51 PM

## 2010-11-08 NOTE — Discharge Summary (Signed)
Shawna [name], Shawna [name]                  ACCOUNT NO.:  [id number]   MEDICAL RECORD NO.:  [id number]          PATIENT TYPE:  INP   LOCATION:  [id number]                         FACILITY:  [facility]   PHYSICIAN:  Rexene Alberts, M.D.    DATE OF BIRTH:  [birth year]   DATE OF ADMISSION:  08/26/2007  DATE OF DISCHARGE:  08/29/2007                               DISCHARGE SUMMARY   DISCHARGE DIAGNOSES:  1. Syncope secondary to orthostatic hypotension.  2. 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  3. 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  4. Hyponatremia, improved with intravenous fluids.  5. Hypothyroidism.  6. Osteoporosis.  7. Chronic low back pain  8. Mild mental retardation.   DISCHARGE MEDICATIONS:  1. Fludrocortisone 0.1 mg b.i.d., can be titrated accordingly to      decrease orthostatic hypotension.  2. Ultram 25 mg every 6 hours p.r.n. for pain (do not titrate up to 50      mg because of an increase in dizziness).  3. Calcium with vitamin D twice daily (the patient may have difficulty      tolerating the large pill).  4. Senokot-S 1 tablet nightly.  5. Synthroid 75 mcg daily.  6. Multivitamin once daily.  7. Ferrous sulfate 325 mg once daily.  8. Ensure supplement t.i.d.   DISCHARGE DISPOSITION:  The patient is being discharged to [facility]  skilled nursing facility today for further rehabilitation.  She is  currently stable and in no acute distress.   CONSULTATIONS:  1. Physical therapist.  2. Occupational therapist.   PROCEDURES PERFORMED:  1. MRI of the brain on August 26, 2007.  The results revealed no acute      intracranial abnormality.  Very advanced nonspecific cerebellar      volume loss for age.  Mild generalized volume loss and nonspecific      cerebral white matter disease.  Mild paranasal sinus inflammatory      changes.  2. Chest x-ray on August 26, 2007.  The results revealed no active  cardiopulmonary disease.   HISTORY OF PRESENT ILLNESS:  The patient is a 55 year old woman with a  past medical history significant for hypothyroidism, mild mental  retardation, and chronic low back pain.  She presented to the emergency  department on August 26, 2007, after experiencing a syncopal episode at  home.  When the patient was evaluated in the emergency department, her  blood pressure was found to be low at 84/60.  The patient was therefore  admitted for further evaluation and management.   For additional details, please see the dictated history and physical.   HOSPITAL COURSE:  1. Syncope, orthostatic hypotension, disequilibrium, hyponatremia, and      cerebellar atrophy.  As indicated above, the patient was      hypotensive at the time of the initial hospital assessment.  The      patient did not appear to be septic as her white blood cell count      was within normal limits and she was afebrile.  The patient,      however, did appear to be a little volume depleted.  In addition,      the patient's serum sodium was found to be low at 124.  The patient      was therefore started on volume repletion with normal saline.  For      further evaluation, a number of lab studies were ordered including      a serum cortisol, TSH, vitamin B12, uric acid, urine sodium, and      urine osmolality.  The patient's random cortisol level was 9.3,      within normal limits.  The fasting cortisol level the following      morning was also within normal limits at 7.5.  Her TSH was within      normal limits at 0.63.  Her vitamin B12 was 1310.  Her urine      osmolality was 565 and her urine sodium was 29.  The uric acid was      5, within normal limits.  For further evaluation, an MRI of the      brain was ordered.  The MRI revealed advanced atrophy of the      cerebellum.  There was no evidence of an acute stroke or mass.      Over the course of the hospitalization, orthostatic vital signs       were ordered for additional evaluation.  The patient was found to      be profoundly orthostatic with her systolic blood pressure      decreasing at least 30-40 mmHg when she stood up.  For this reason,      Florinef was added for treatment.  Initially it was started at a      once-daily dosing.  However, it has been increased to b.i.d.  The      physical and occupational therapists evaluated the patient and      recommended short-term skilled nursing facility placement for      strengthening and for equilibrium training.  These recommendations      were discussed with the patient's sister, Shawna [name], who      wholeheartedly agreed with short-term skilled nursing facility      placement.  Following volume repletion, the patient's serum sodium      did improve to 130.  She will be therefore discharged to [place] facility today.  Of note, more than likely, the      patient's disequilibrium and syncope were secondary to orthostatic      hypotension, hyponatremia, and cerebellar atrophy.  Given the      severe cerebellar atrophy, the patient may have autonomic      dysfunction.  2. Generalized anorexia and history of weight loss.  The patient was      evaluated by the registered dietician.  The dietician recommended      supplemental Ensure and ongoing vitamin therapy.  The patient      apparently ate fairly well during the hospital course.  She was      assessed for infection and her urinalysis was essentially negative      and her chest x-ray revealed no acute cardiopulmonary findings.  3. Chronic low back pain.  The patient was maintained on as-needed      Ultram during the hospital course.  Because Ultram can cause      dizziness, the dose should not be titrated upward as it  can      exacerbate or increase the risk of syncope.      Rexene Alberts, M.D.  Electronically Signed     DF/MEDQ  D:  08/29/2007  T:  08/29/2007  Job:  [id number]   cc:   Ronaldo Miyamoto, M.D.

## 2010-11-08 NOTE — Op Note (Signed)
NAMEJESSYCA, Shawna [name]                  ACCOUNT NO.:  [id number]   MEDICAL RECORD NO.:  [id number]          PATIENT TYPE:  AMB   LOCATION:  ENDO                         FACILITY:  [facility]   PHYSICIAN:  Waverly Ferrari, M.D.    DATE OF BIRTH:  12-16-1955   DATE OF PROCEDURE:  01/10/2008  DATE OF DISCHARGE:                               OPERATIVE REPORT   PROCEDURE:  Colonoscopy.   INDICATIONS:  Colon polyp followup.   ANESTHESIA:  Fentanyl 35 mcg, Versed 1 mg.   DESCRIPTION OF PROCEDURE:  With the patient mildly sedated in the left  lateral decubitus position the Pentax videoscopic pediatric colonoscope  was inserted in the rectum and then passed under direct vision to the  sigmoid colon at which point we encountered a tight turn, and I tried to  get through this, but I was unable; so I have decided to withdraw the  colonoscope taking circumferential views remaining of the colonic  mucosa, stopping in the rectum which appeared normal on direct view.  I  could not retroflex the endoscope to get an adequate retroflex view, but  the endoscope was straightened; and I pulled back through the anal  canal.  The patient's vital signs and pulse oximeter remained stable.  The patient tolerated the procedure well without apparent complication.   FINDINGS:  Negative flexible sigmoidoscopy on retroflex view.   PLAN:  I will proceed with barium enema to review the remainder of the  colon.           ______________________________  Waverly Ferrari, M.D.     GMO/MEDQ  D:  01/10/2008  T:  01/10/2008  Job:  [id number]

## 2010-11-08 NOTE — Op Note (Signed)
Shawna [name], Shawna [name]                  ACCOUNT NO.:  [id number]   MEDICAL RECORD NO.:  [id number]          PATIENT TYPE:  AMB   LOCATION:  ENDO                         FACILITY:  [facility]   PHYSICIAN:  Waverly Ferrari, M.D.    DATE OF BIRTH:  07/24/1955   DATE OF PROCEDURE:  01/10/2008  DATE OF DISCHARGE:                               OPERATIVE REPORT   PROCEDURE:  Upper endoscopy.   INDICATIONS:  Abdominal pain made better with proton pump inhibitor.   ANESTHESIA:  Fentanyl 100 mcg, Versed 10 mg.   PROCEDURE IN DETAIL:  With the patient mildly sedated in the left  lateral decubitus position the Pentax videoscopic endoscope was inserted  in the mouth, passed under direct vision through the esophagus which  appeared normal.  There was no evidence of Barrett's esophagus.  We  entered into the stomach.  Fundus, body, antrum, duodenal bulb, second  portion of duodenum were visualized.  From this point the endoscope was  slowly withdrawn taking circumferential views of duodenal mucosa until  the endoscope had been pulled back and the stomach placed in  retroflexion to view the stomach from below.  The endoscope was  straightened and withdrawn taking circumferential views of remaining  gastric and esophageal mucosa after biopsying the stomach which appeared  erythematous in the antrum and body of the stomach.  The endoscope was  withdrawn.  The patient's vital signs, pulse oximeter remained stable.  The patient tolerated the procedure well without apparent complication.   FINDINGS:  Erythematous change of the stomach, biopsied, await biopsy  report.  The patient will call me for results and follow up with me as  an outpatient.  Proceed to colonoscopy.           ______________________________  Waverly Ferrari, M.D.     GMO/MEDQ  D:  01/10/2008  T:  01/10/2008  Job:  [id number]

## 2010-11-08 NOTE — H&P (Signed)
Shawna [name], Shawna [name]                  ACCOUNT NO.:  [id number]   MEDICAL RECORD NO.:  [id number]          PATIENT TYPE:  INP   LOCATION:  1827                         FACILITY:  [facility]   PHYSICIAN:  Rexene Alberts, M.D.    DATE OF BIRTH:  28-Jan-1956   DATE OF ADMISSION:  08/26/2007  DATE OF DISCHARGE:                              HISTORY & PHYSICAL   PRIMARY CARE PHYSICIAN:  Ronaldo Miyamoto, M.D.   CHIEF COMPLAINT:  I passed out.  The patient also complains of  dizziness and generalized weakness.   HISTORY OF PRESENT ILLNESS:  The patient is a 55 year old woman with a  past medical history significant for chronic low back pain,  hypothyroidism, and mild mental retardation, who presents to the  emergency department with a chief complaint of dizziness, weakness, and  apparently a syncopal episode.  The history is provided by both the  patient, and the patient's brother Mr. Carlton Angermeier.  Accordingly, the  patient has had generalized weakness and dizziness for approximately 4  weeks which worsened 2 weeks ago.  Mr. Eleby states that the patient's  symptoms started after she was started on Forteo.  The patient's  dizziness is mostly an off balance feeling; however, at times, she does  acknowledge a spinning dizziness.  She has had no focal weakness, but  feels weak all over at times.  She also had one episode of nausea and  vomiting yesterday.  She has had no further nausea and vomiting.  She  denies abdominal pain, diarrhea, constipation, bloody stools, black  tarry stools, and painful urination.  However, she has had a  nonproductive cough.  She was evaluated by her primary care physician  Dr. Wilson Singer, last week, and he diagnosed pneumonia.  The patient was  subsequently started on Levaquin which she has been taking for the past  3 days.  The patient acknowledges that she has had subjective fever and  chills.  However, her temperature at home has been normal, according to  Mr. Tungate.  The patient  acknowledges a mild frontal headache, but no  visual changes, no chest pain, no palpitations, and no other upper  respiratory infection symptoms.  Yesterday, however she was walking  toward her brother, and became lightheaded and passed out for a few  seconds.  Fortunately, Mr. Spruiell caught her before she fell onto the  floor.  When the patient regained consciousness, she was aware of her  surroundings; however, she was unsure about what had happened.   The patient was brought to the emergency department on August 10, 2007  for evaluation of dizziness and a poor appetite.  When she was evaluated  in the emergency department on August 10, 2007, she was noted to be  hypotensive with her blood pressure systolically ranging from 80-98.  A  CT scan of her head was ordered, and it revealed pronounced cerebellar  atrophy, but no acute findings.  The CT scan of her abdomen and pelvis  was completely within normal limits.  On arrival to the emergency  department, today, her blood pressure was  noted to be 84/60.  It is  currently 111/52 after approximately 700 mL of normal saline.  Her lab  data are significant for a serum sodium of 124 and a hemoglobin of 11.5.  Her chest x-ray reveals no active cardiopulmonary disease.  The patient,  however, will be admitted for further evaluation and management.   PAST MEDICAL HISTORY:  1. Emergency department visit on August 10, 2007 which she yielded a      negative CT scan of the head with the exception of evidence of      chronic cerebellar atrophy; the CT scan of the abdomen and pelvis      was negative.  During the emergency department visit, the patient's      blood pressure was noted to be in the 80s which improved to the 90s      during the emergency department stay.  2. Recent diagnosis of pneumonia by Dr. Wilson Singer.  The patient was      started on Levaquin 3 days ago.  3. Hypothyroidism.  4. Chronic low back pain.  5. Osteoporosis.  6. Rectal  bleeding secondary to colon polyps in April 2008, per      colonoscopy by Dr. Lajoyce Corners.  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 Lajoyce Corners in April 2008.  8. Chronic anemia.  9. Mild mental retardation.  10.Status post laparoscopic cholecystectomy in November 2007.  11.Status post total right replacement x2.   MEDICATIONS:  1. Ultram 50 mg daily to b.i.d. p.r.n.  2. Iron 1 tablet b.i.d.  3. Synthroid 75 mcg daily.  4. Forteo 20 mcg one injection daily (started July 22, 2006).  5. Levaquin 500 mg daily and (started 3 days ago).   ALLERGIES:  No known drug allergies.   SOCIAL HISTORY:  The patient is single.  She lives in [place],  [place] in a house located on the same property of her siblings.  Her brother, Larona Sheffler, is her primary care overseer.  His phone number  is [phone].  The patient has no children.  She completed high school.  She is unemployed.  She denies tobacco, alcohol, and illicit drug use.   FAMILY HISTORY:  The patient was adopted and the health and whereabouts  of her biological parents are unknown.   REVIEW OF SYSTEMS:  As above in the history present illness.   PHYSICAL EXAMINATION:  VITAL SIGNS:  Temperature 97.6, blood pressure  84/60 repeated at 111/52, pulse 104 repeated at 88, respiratory rate 20,  oxygen saturation 99% on 2 liters of oxygen.  GENERAL: The patient is a 55 year old Caucasian woman who is currently  sitting up in bed in no acute distress.  HEENT:  Head is normocephalic nontraumatic.  Pupils equal, round,  reactive to light.  Extraocular movements are intact.  Conjunctivae are  clear.  Sclerae are white.  Tympanic membranes are clear with mild  scarring on the right greater than the left tympanic membrane.  No  surrounding erythema.  Nasal mucosa is mildly dry.  No sinus tenderness.  Oropharynx reveals fair dentition.  Mucous membranes are mildly dry.  No  posterior exudates or erythema.  NECK:  Supple.  No adenopathy, question  mild thyromegaly, no bruit, no  JVD.  LUNGS:  Clear to auscultation bilaterally, without wheezes or crackles.  HEART:  S1-S2 with a soft systolic murmur.  ABDOMEN:  Positive bowel sounds, soft, nontender, nondistended.  No  hepatosplenomegaly, no masses palpated.  Well-healed small epigastric  and  right upper quadrant scars.  EXTREMITIES:  Well-healed right hip scars.  Pedal pulses palpable  bilaterally.  No pretibial edema and no pedal edema.  NEUROLOGIC: The patient is alert and oriented x2.  Cranial nerves II-XII  are intact.  Strength is 5/5 throughout.  Sensation is intact.  No  nystagmus.  Cerebellar testing  with finger-to-nose is intact.  Gait not  assessed.   ADMISSION LABORATORIES:  EKG reveals normal sinus rhythm with a heart  rate of 87 beats per minute.  Chest x-ray reveals no active  cardiopulmonary disease.  Urinalysis reveals 15 ketones, otherwise  negative nitrite, negative leukocytes, negative protein, negative blood  (urinalysis obtained via an in-and-out catheterization).  Sodium 124,  potassium 4.9 (hemolyzed specimen), chloride 87, CO2 27, glucose 106,  BUN 16, creatinine 0.93, total bilirubin 0.7, alkaline phosphatase 242,  SGOT 42, SGPT 34, total protein 6.8, albumin 3.4, calcium 9.4 WBC 6.6,  hemoglobin 11.5, platelets 370.   ASSESSMENT:  Problem #1:  Generalized weakness, dizziness, and syncope.  The etiology of the patient's symptoms is unclear at this time.  She is  obviously hypotensive, and has been hypotensive for several days.  She  may be becoming orthostatic which probably caused her syncopal episode  yesterday.  She does not appear to have any focal neurological deficits  on exam.  There was no evidence of an abnormal cerebellar exam with  regards to finger-to-nose testing.  There appears to be no nystagmus on  exam as well.  The patient does have evidence of cerebellar atrophy on  the CT scan obtained in the emergency department on August 10, 2007.  She does not appear to have an active urinary tract infection or  pneumonia at this time, although she had been treated with Levaquin for  at least 3 days for a clinical diagnosis of pneumonia last week.   Of note, the patient's symptoms appeared to have become progressive  following the initiation of therapy with Forteo.  Some of the side  effects of Forteo does include dizziness, weakness, and nausea.   Problem #2: Hypotension.  The patient's blood pressure was 84/60 on  arrival to the emergency department.  The patient does not appear to be  overly volume depleted; however, coupled with hyponatremia, the patient  actually may well be hypovolemic.   Problem #3:  Hyponatremia.  The patient's serum sodium is 124.  When she  was evaluated in the emergency department on August 10, 2007, her  serum sodium was 138.   Problem #4: Mild anemia.  The patient's hemoglobin is 11.5.   Problem #5:  Hypothyroidism.  The patient is treated chronically with  Synthroid.   Problem #6:  Mild mental retardation.   PLAN:  The patient will be admitted for further evaluation and  management.  1. Will start volume repletion with normal saline, and will follow the      patient's serum sodium closely.  2. Will discontinue Forteo.  3. Will add as-needed Antivert for dizziness/possible vertigo.  4. Nutrition consult.  As the patient apparently has had a weight loss      of approximately 20 pounds, unintentionally.  5. For further evaluation, will order an MRI of the brain, serum      cortisol level, TSH, vitamin B12, and RPR.  6. Will check one or two sets of cardiac enzymes.  7. Will assess the patient's hyponatremia with sodium studies/indices.  8. Will monitor the patient's blood pressure and will check  orthostatic vital signs each morning.  9. Fall precautions.      Rexene Alberts, M.D.  Electronically Signed     DF/MEDQ  D:  08/26/2007  T:  08/26/2007  Job:  [id number]   cc:   Ronaldo Miyamoto, M.D.

## 2010-11-11 NOTE — Discharge Summary (Signed)
Shawna [name], Shawna [name]                           ACCOUNT NO.:  [id number]   MEDICAL RECORD NO.:  [id number]                   PATIENT TYPE:  INP   LOCATION:  5023                                 FACILITY:  [facility]   PHYSICIAN:  Estill Bamberg. Ronnie Derby, M.D.              DATE OF BIRTH:  12-09-1955   DATE OF ADMISSION:  02/09/2003  DATE OF DISCHARGE:  02/12/2003                                 DISCHARGE SUMMARY   ADMITTING DIAGNOSIS:  Failed right total hip arthroplasty, loosening  acetabular component.   DISCHARGE DIAGNOSIS:  Status post revision of right total hip arthroplasty.   ALLERGIES:  No known drug allergies.   CURRENT MEDICATIONS:  Motrin elixir p.r.n.   SURGICAL PROCEDURE:  The patient was taken to the operating room on February 09, 2003, by Dr. Lara Mulch, assisted by Dr. Laurina Bustle and Zenaida Deed, P.  A.-C.  The patient was placed under general anesthesia, and a right total  hip arthroplasty revision with acetabular poly exchange was performed.  EBL  200 cc.  The patient tolerated the procedure well, and returned to recovery  in good stable condition.   CONSULTATIONS:  1. PT.  2. Case management.  3. Occupational therapy.   HOSPITAL COURSE:  The patient remained afebrile throughout the hospital  stay.  Vital signs remained stable.  The patient was discharged home in good  stable condition on postop day #3.   LABORATORY DATA:  Routine labs on admission:  CBC 4.9, hemoglobin 14.5,  hematocrit 42, platelets 200.   Coag's on admission:  PT 12.7, INR 0.5, PTT 29.6.   Routine chemistries on admission:  Sodium 139, potassium 4, chloride 101,  bicarb 32, glucose 77, BUN 7, creatinine 0.8.   Hepatic enzymes on admission:  AST 35, ALT 29, ALP 282.  Total bilirubin was  0.5.   Urinalysis on admission was negative.   EKG on admission showed normal sinus rhythm.  Heart rate 70 beats per  minute.  PR interval 178 milliseconds.  PRT axis of 45, 25, and 24.   DISCHARGE  MEDICATIONS:  1. Lovenox 40 mg subcutaneously q. day.  2. Percocet 5 mg one to two tablets q.4-6h. as needed for pain.   The patient is not to take Motrin until after Lovenox dose is completed.   ACTIVITY:  Weightbearing as tolerated with walker.  Home health/PT with  [facility].   CONDITION ON DISCHARGE:  The patient was discharged to home in good stable  condition.   DIET:  No restrictions.   FOLLOWUP:  The patient needs a follow up visit with Dr. Ronnie Derby in 10 to 12  days after discharge.  The patient will call the office at [phone] for an  appointment.   WOUND CARE:  Keep incision clean and dry.  Daily dressing changes.  May  shower after two days of no drainage from the wound site.  The patient is  to  call Dr. Ruel Favors office if she has any of the following:  Temperature  greater then 101.5, chills, swelling, foul smelling drainage from the wound  incision, or pain not controlled with current pain medications.      Erskine Emery, P.A.                       Estill Bamberg. Ronnie Derby, M.D.    GC/MEDQ  D:  03/07/2003  T:  03/09/2003  Job:  [id number]

## 2010-11-11 NOTE — Op Note (Signed)
[name], [name]                 ACCOUNT NO.:  [id number]   MEDICAL RECORD NO.:  [id number]          PATIENT TYPE:  INP   LOCATION:  5727                         FACILITY:  [facility]   PHYSICIAN:  Merri Ray. Grandville Silos, M.D.DATE OF BIRTH:  07-18-1955   DATE OF PROCEDURE:  04/28/2006  DATE OF DISCHARGE:                                 OPERATIVE REPORT   PREOPERATIVE DIAGNOSIS:  Acute cholecystitis.   POSTOPERATIVE DIAGNOSIS:  Acute cholecystitis.   PROCEDURE:  Laparoscopic cholecystectomy with intraoperative cholangiogram.   SURGEON:  Merri Ray. Grandville Silos, M.D.   ASSISTANT:  Odis Hollingshead, M.D.   ANESTHESIA:  General.   HISTORY OF PRESENT ILLNESS:  The patient is a 55 year old female who was  admitted last night with signs and symptoms of acute cholecystitis.  She is  placed on bowel rest with intravenous antibiotics; and she is brought for  cholecystectomy.   PROCEDURE IN DETAIL:  Informed consent was obtained from the patient, in  addition, at her request I discussed things thoroughly with her brother over  the phone; she has received intravenous antibiotics.  She was brought to the  operating room and general anesthesia was administered.  Her abdomen was  prepped and draped in a sterile fashion.  Infraumbilical area was  infiltrated with 1/4% Marcaine with epinephrine.  Infraumbilical incision  was made; subcutaneous tissues were dissected down within the anterior  fascia.  This was divided along the linea alba and the peritoneal cavity was  entered under direct vision without difficulty.  A #0 Vicryl pursestring  suture was placed around the fascial opening; and the Hassan trocar was  inserted into the abdomen; and the abdomen was insufflated with carbon  dioxide in a standard fashion.   Laparoscopic exploration revealed a distended, edematous gallbladder with  some surrounding ascites.  The liver had a soft texture, but there was some  macronodular appearance to it.  Under  direct vision an 11-mm epigastric port  was placed.  During its insertion, there was a small linear liver capsule  laceration, that area was cauterized with excellent hemostasis.  Then two 5-  mm lateral ports were placed; 1/4% Marcaine with epinephrine was injected at  all port sites.   The dome of the gallbladder was retracted superomedially.  The gallbladder  was then aspirated of about 50 mL of white bile.  This facilitated easier  grasping, and the gallbladder was retracted superior medially from the dome;  and the infundibulum was retracted inferolaterally.  There was a lot of  adhesions down near the infundibulum which were swept away very gently.  There was a lot of acute inflammation present.  Once this was accomplished,  dissection began laterally and progressed medially identifying the cystic  duct.  Dissection continued until a large window was made between the  infundibulum of the gallbladder, the cystic duct, and the liver.  Further  dissection also demonstrated the cystic artery.  Once we had excellent  visualization, a clip was placed on the infundibulocystic duct junction; and  a small nick was made in cystic duct.  A Reddick cholangiogram  catheter was  inserted.  An intraoperative cholangiogram revealed no common bile filling  defects, and a good length of cystic duct.  The contrast flowed easily into  the duodenum.  Cholangiogram catheter was removed.  Three clips were placed  proximally on the cystic duct; and it was divided.  The cystic artery was  clipped twice proximally; and once distally and divided.  The gallbladder  was taken off the liver bed with Bovie cautery getting excellent hemostasis.  We did encounter a posterior branch of the cystic artery.  This was clipped  twice; and the surrounding area was also cauterized getting excellent  hemostasis.   The gallbladder was placed in the EndoCatch bag; and was removed from the  abdomen via the infraumbilical port.   The abdomen was copiously irrigated;  and the liver bed was cauterized giving excellent hemostasis.  The small  linear capsular laceration of the left lobe of the liver was rechecked; and  excellent hemostasis was present.  We used a total of 2 liters of  irrigation; and the irrigation fluid returned clear.  The liver bed was  rechecked and remained dry.  The infraumbilical fascia was closed by tying  the pursestring #0 Vicryl suture under direct vision.  A small amount of air  was still escaping, so  second figure-of-eight #0 Vicryl suture was placed  to reinforce this closure.  The pneumoperitoneum was released after removing  the 5-mm lateral ports under direct vision.  The epigastric port was  removed.  All four wounds were copiously irrigated.  Some additional local  anesthetic was injected; and the skin of each was closed with a running 4-0  Vicryl subcuticular stitch.  The sponge, needle, and instrument counts were  correct.  Benzoin, Steri-Strips, and sterile dressings were applied.  The  patient tolerated procedure well without apparent complication; and was  taken to recovery room in stable condition.      Merri Ray Grandville Silos, M.D.  Electronically Signed     BET/MEDQ  D:  04/28/2006  T:  04/29/2006  Job:  [id number]

## 2010-11-11 NOTE — Discharge Summary (Signed)
NAMEDEANETTE, Shawna [name]                 ACCOUNT NO.:  [id number]   MEDICAL RECORD NO.:  [id number]          PATIENT TYPE:  INP   LOCATION:  5727                         FACILITY:  [facility]   PHYSICIAN:  Haywood Lasso, M.D.DATE OF BIRTH:  1955-07-13   DATE OF ADMISSION:  04/27/2006  DATE OF DISCHARGE:  04/29/2006                               DISCHARGE SUMMARY   FINAL DIAGNOSES:  Acute cholecystitis.   CLINICAL HISTORY:  Ms. Dreyfuss is a 55 year old lady, who presented to the  emergency room with right upper quadrant pain and known gallstones.  She  was admitted on April 27, 2006.   HOSPITAL COURSE:  The patient was admitted on the evening of April 27, 2006, and the next morning still complained of significant right upper  quadrant pain.  She was taken to the emergency room, where a  laparoscopic cholecystectomy was done.  Intraoperative cholangiography  was negative.  The finding was that of acute cholecystitis.   The next morning, she was doing well, tolerating her diet with a benign  abdomen, and was able to be discharged.   PATHOLOGY REPORT:  She had acute cholecystitis and cholelithiasis.   LABORATORY DATA:  Hemoglobin 11.8 with a next-day hemoglobin of 10.1  with rehydration.  Liver functions were slightly elevated consistent  with her cholecystitis.  Urinalysis was essentially negative.   FOLLOWUP:  The patient was to be followed in our office postoperatively.      Haywood Lasso, M.D.  Electronically Signed     CJS/MEDQ  D:  05/23/2006  T:  05/23/2006  Job:  [id number]

## 2010-11-11 NOTE — H&P (Signed)
NAME:  Shawna [name], Shawna [name] NO.:  [id number]   MEDICAL RECORD NO.:  [id number]                   PATIENT TYPE:  INP   LOCATION:                                       FACILITY:  [facility]   PHYSICIAN:  Estill Bamberg. Ronnie Derby, M.D.              DATE OF BIRTH:  [birth year]   DATE OF ADMISSION:  02/09/2003  DATE OF DISCHARGE:                                HISTORY & PHYSICAL   CHIEF COMPLAINT:  Chronic right hip pain.   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  The patient indicates that she has been having some chronic deep  aching sensation in the hip over the last 6 to 12 months.  She describes it  with any type of activity; rest, sitting, and lying down seems to relieve  her symptoms.  She is currently just using Motrin.  The patient was  evaluated; x-rays revealed some loosening of the acetabular component.   ALLERGIES:  No known drug allergies.   CURRENT MEDICATIONS:  Motrin elixir p.r.n.  The patient has difficulty  swallowing capsules.   MEDICAL HISTORY:  The patient denies any medical problems.   PAST SURGICAL HISTORY:  1. Right total hip arthroplasty in 1988.  2. Lipoma of her back.  3. The patient denies any complications with previous anesthesia.   SOCIAL HISTORY:  The patient is a healthy-appearing, well-developed, 70-year-  old, white female.  Denies any history of smoking.  Has an occasional  alcoholic beverage.  Single; no children.  She lives in a 1-story house.  She is not employed.   FAMILY PHYSICIAN:  Alvina Filbert, M.D.   FAMILY MEDICAL HISTORY:  Mother is deceased from gastric cancer.  Father is  alive with a history of cardiac problems with a cardiac pacer.  The patient  has 2 brothers, alive and in good medical health, and 1 sister alive and in  good medical health.   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   PHYSICAL EXAMINATION:  VITAL SIGNS:  Pulse 76 and regular, respirations 14,  blood pressure 100/68, temperature is afebrile, height 5 ft. 4 in., weight  135 pounds.  GENERAL:  This is a healthy-appearing, well-developed, white female.  She  does ambulate with a slightly right-sided limp.  She is able to get on and  off the exam table without any difficulty.  HEENT:  Head was normocephalic.  Pupils are equal, round, and reactive to  light and accommodation.  Extraocular muscles are intact.  Sclerae  nonicteric.  Nasal septum midline.  External ears without deformities.  Canals patent.  TMs  pearly gray and intact.  Oropharynx intact.  Oral and  buccal mucosa was pink and moist without lesions.  Dentition was in good  repair.  Uvula was midline.  The patient is able to swallow without any  difficulty.  NECK:  Supple, no palpable lymphadenopathy.  Thyroid region was nontender.  The patient was nontender to percussion along the entire spinal column.  CHEST:  Lung sounds were clear and equal bilaterally.  No wheezes, rales,  rhonchi, or rubs noted.  HEART:  Regular rate and rhythm, S1 and S2 is auscultated.  No murmurs,  rubs, or gallops noted.  ABDOMEN:  Soft, nontender.  Bowel sounds were normoactive throughout.  No  hepatosplenomegaly.  CVA region was nontender.  EXTREMITIES:  Upper extremities were symmetrically sized in shape.  She had  excellent range of motion of her shoulders, elbows, and wrists.  Motor  strength is 5/5.  Lower extremities:  Right and left hip were symmetrically  sized in shape.  Right hip had an old lateral surgical incision.  Extension  was full.  She as only able to flex up to 90 degrees.  She had 0 degrees  external rotation and about 15 degrees internal  rotation.  Left hip had full  extension, flexion up to 120 degrees with 20 degrees internal/external  rotation.  Bilateral knees were symmetrical with no mechanical symptoms, no  instability and no discomfort.  Full range of motion.  Calves nontender,  ankle symmetrical with good dorsi plantar flexion.  PERIPHERAL VASCULAR:  Carotid pulses were 2+, no bruits.  Radial pulses 2+.  Dorsalis pedis and posterior tibial pulses were 1+.  She had no lower  extremity edema or venous stasis changes.  NEUROLOGIC:  The patient was conscious, alert, and appropriate.  Cranial  nerves II-XII grossly intact.  The patient has no gross neurologic defects.  BREASTS/RECTAL/GU:  Deferred at this time.   IMPRESSION:  Failed right total hip arthroplasty, loose acetabular  component.   PLAN:  The patient will be admitted to [facility] on February 09, 2003, under the care of Lara Mulch, M.D.  The patient will undergo a  revision of her right total hip arthroplasty scheduled for an acetabular  component only.  The patient will undergo all routine labs and tests for  this procedure.      Evert Kohl, P.A.                      Estill Bamberg. Ronnie Derby, M.D.    RWK/MEDQ  D:  02/02/2003  T:  02/02/2003  Job:  [id number]

## 2010-11-11 NOTE — Op Note (Signed)
NAMEARIDAI, LAJARA                  ACCOUNT NO.:  [id number]   MEDICAL RECORD NO.:  [id number]          PATIENT TYPE:  AMB   LOCATION:  ENDO                         FACILITY:  [facility]   PHYSICIAN:  Waverly Ferrari, M.D.    DATE OF BIRTH:  05/24/1956   DATE OF PROCEDURE:  09/25/2006  DATE OF DISCHARGE:                               OPERATIVE REPORT   PROCEDURE:  Upper endoscopy.   INDICATIONS:  Abdominal pain.   ANESTHESIA:  Demerol 60 mg, Versed 6 mg.   PROCEDURE:  With the patient mildly sedated in the left lateral  decubitus position the Pentax videoscopic endoscope was inserted and  passed under direct vision through the esophagus which appeared normal  into the stomach.  Fundus, body appeared normal.  Antrum showed a single  AVM.  Duodenal bulb, second portion duodenum were visualized.  From this  point the endoscope was slowly withdrawn taking circumferential views of  duodenal mucosa until the endoscope had been pulled back into the  stomach placed in retroflexion to view the stomach from below.  The  endoscope was then straightened and withdrawn taking circumferential  views remaining gastric and esophageal mucosa.  The patient's vital  signs, pulse oximeter remained stable.  The patient tolerated procedure  well without apparent complications.   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   PLAN:  Proceed to colonoscopy.           ______________________________  Waverly Ferrari, M.D.     GMO/MEDQ  D:  09/25/2006  T:  09/25/2006  Job:  [id number]

## 2010-11-11 NOTE — Op Note (Signed)
[name], Shawna [name]                           ACCOUNT NO.:  [id number]   MEDICAL RECORD NO.:  [id number]                   PATIENT TYPE:  INP   LOCATION:  5023                                 FACILITY:  [facility]   PHYSICIAN:  Estill Bamberg. Ronnie Derby, M.D.              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   PROCEDURE:  Right revision total hip arthroplasty with ball and liner  exchange.   SURGEON:  Estill Bamberg. Ronnie Derby, M.D.   ASSISTANTEmogene Morgan. Laurina Bustle, M.D.   ANESTHESIA:  General.   INDICATIONS FOR PROCEDURE:  The patient is a 55 year old white female with  evidence of polyethylene wear and subluxation episodes of the hip. Informed  consent was obtained.   DESCRIPTION OF PROCEDURE:  The patient was taken to the operating room, laid  in the supine position, administered general anesthesia and then laid in the  right up left down lateral decubitus position. The right hip was prepped and  draped in the usual sterile fashion. A #10 blade was used to make a [place]  type of midline incision through the old incision. A new 10 blade was used  to dissect down to and through the fascia lata. The bleeding vessels were  cauterized with the cautery and the Charnley retractor was put in place.  Posterior approach was made by developing a single sleeve of the posterior  soft tissues including the hip joint capsule and the short external rotators  and these were tacked with three sutures. I then did a capsulectomy of the  live scar tissue that was in the joint. I then easily dislocated the hip and  removed the ball. I then placed the femur anterior to the acetabulum and  held it in place with the Hohmann retractor. I then removed soft tissues so  we had a good view 360 degrees around the acetabulum and then removed the  locking screws from the nonconstrained poly liner. We then removed the liner  without any difficulty and placed the new liner with the 20 degree face  changing liner accepting a 32 mm head. We then placed a 32 mm with the 0  plus trial in place, located the hip and it was very very stable. We then  dislocated again and put on the real 0 on a clean Morris taper and relocated  the hip. Again it was very very stable through exchange of range of motion.  I then repaired the short external rotator and posterior capsular sleeve  through two drill holes in the trochanter. I then irrigated copiously and  closed the  fascia lata with interrupted #1 figure-of-eight Vicryl sutures,  deep soft tissues with interrupted 0 Vicryl sutures, subcuticular 2-0 Vicryl  layer and then skin staples. I dressed with Xeroform dressing, sponges,  ABD's and sterile Ioban drape. The patient tolerated the procedure well.  Complications none. Drains none. Estimated blood loss 300 mL.                                               Estill Bamberg. Ronnie Derby, M.D.    SDL/MEDQ  D:  04/13/2003  T:  04/14/2003  Job:  [id number]

## 2010-11-11 NOTE — H&P (Signed)
Shawna Shawna [name], Shawna [name]                 ACCOUNT NO.:  [id number]   MEDICAL RECORD NO.:  [id number]          Shawna [name] TYPE:  INP   LOCATION:  5727                         FACILITY:  [facility]   PHYSICIAN:  Isabel Caprice. Hassell Done, MD  DATE OF BIRTH:  08/08/1955   DATE OF ADMISSION:  04/27/2006  DATE OF DISCHARGE:                                HISTORY & PHYSICAL   DATE AND TIME:  November 2. 2007, 2330 hours.   CHIEF COMPLAINT:  Increasing right upper quadrant pain for about a week with  known gallstones.   HISTORY:  Shawna Shawna [name] is a 55 year old white female, Shawna adopted daughter of  Shawna late Dr. Kenyon Ana who treated her for a long time with recurrent  bouts of pain and gallstones at home.  Since his death a couple of months  ago, she has been cared for by Shawna family members but has been having  increasing bouts of right upper quadrant pain, and was brought into [facility] ED  tonight, had an ultrasound which showed thick-walled gallbladder.   ALLERGIES:  Shawna Shawna [name] HAS NO KNOWN ALLERGIES.   MEDICATIONS:  She takes Fosamax.   PRIOR SURGERY:  Includes right hip replacements x2 by Dr. Laurina Bustle once, and  by Dr. Ronnie Derby once.   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  She is  a nondrinker, nonsmoker, and single.  She is looked after by her brothers  and sisters.   PHYSICAL EXAMINATION:  VITAL SIGNS:  Afebrile.  Blood pressure 111/41, pulse  78, respirations 18.  HEENT EXAM:  No scleral icterus.  GENERAL:  Shawna [name] is lying over on her side with some episodic writhing in  pain.  CHEST:  Clear.  HEART:  Sinus rhythm without murmurs or gallops.  ABDOMEN:  Flat, and she is complaining of right upper quadrant abdominal  pain.  She says this is Shawna same character of Shawna pain she has always had.  EXTREMITY EXAM:  Full range of motion to Shawna extent I can examine her on Shawna  bed, although she has had two right hip replacements.   LABORATORIES:  Hemoglobin 11.1, white count 9600,  platelets 212,000,  neutrophils 82%.  Electrolytes normal.  SGOT slightly elevated at 40.  Alk  phos elevated at 362.  Bilirubin is normal.  Lipase within normal limits.   IMPRESSION:  Cholecystitis.   PLAN:  Admission,  IV antibiotics and pain management.  Consideration for  emergent versus delayed cholecystectomy for what is probably a subacute and  chronic cholecystitis.      Isabel Caprice Hassell Done, MD  Electronically Signed     MBM/MEDQ  D:  04/27/2006  T:  04/29/2006  Job:  [id number]

## 2010-11-11 NOTE — Op Note (Signed)
NAMEINDEA, HARBOR                  ACCOUNT NO.:  [id number]   MEDICAL RECORD NO.:  [id number]          PATIENT TYPE:  AMB   LOCATION:  ENDO                         FACILITY:  [facility]   PHYSICIAN:  Waverly Ferrari, M.D.    DATE OF BIRTH:  [birth year]   DATE OF PROCEDURE:  09/25/2006  DATE OF DISCHARGE:                               OPERATIVE REPORT   PROCEDURE:  Colonoscopy.   INDICATIONS:  Colon polyps and rectal bleeding.   ANESTHESIA:  Demerol 25 mg, Versed 2 mg.   PROCEDURE:  With the patient mildly sedated in the left lateral  decubitus position, the Pentax videoscopic pediatric colonoscope was  inserted in the rectum and passed under direct vision to the cecum  identified by ileocecal valve and appendiceal orifice, both of which  were photographed.  From this point, the colonoscope was slowly  withdrawn taking circumferential views of colonic mucosa, stopping at  approximately 15 cm from anal verge at which point a large polyp was  encountered that we removed in piecemeal fashion, snaring part of it and  going back and snaring the remainder of it.  We were able to see the  base of the polyp and it looked good.  There was a question of visible  vessel that had been burned as well and I elected to use hot biopsy  forceps this snatch this presumed vessel and gave it more cautery.  The  forceps were then opened, releasing of the vessel closed and withdrawn.  The polyp was at least in part retrieved.  The rectum appeared normal on  direct and retroflexed view.  The endoscope was withdrawn.  The  patient's vital signs, pulse oximeter remained stable.  The patient  tolerated procedure well without apparent complication.   FINDINGS:  A large polyp on a stalk 15 cm from anal verge which may be  the cause of the patient's rectal bleeding.  No other lesions were  noted.   PLAN:  Await biopsy report.  The patient will call me for results and  follow-up with me as an outpatient.       ______________________________  Waverly Ferrari, M.D.     GMO/MEDQ  D:  09/25/2006  T:  09/25/2006  Job:  [id number]

## 2011-01-26 ENCOUNTER — Encounter: Payer: Self-pay | Admitting: Family Medicine

## 2011-01-26 ENCOUNTER — Ambulatory Visit (INDEPENDENT_AMBULATORY_CARE_PROVIDER_SITE_OTHER): Payer: Medicare Other | Admitting: Family Medicine

## 2011-01-26 VITALS — BP 110/70 | HR 82 | Ht 64.0 in | Wt 120.1 lb

## 2011-01-26 DIAGNOSIS — E039 Hypothyroidism, unspecified: Secondary | ICD-10-CM

## 2011-01-26 DIAGNOSIS — E876 Hypokalemia: Secondary | ICD-10-CM

## 2011-01-26 DIAGNOSIS — F79 Unspecified intellectual disabilities: Secondary | ICD-10-CM

## 2011-01-26 DIAGNOSIS — G8929 Other chronic pain: Secondary | ICD-10-CM

## 2011-01-26 MED ORDER — POTASSIUM CHLORIDE CRYS ER 20 MEQ PO TBCR: EXTENDED_RELEASE_TABLET | ORAL | Status: DC

## 2011-01-26 MED ORDER — OXYCODONE HCL 5 MG PO TABS: 5.0000 mg | ORAL_TABLET | ORAL | Status: DC | PRN

## 2011-01-26 MED ORDER — LEVOTHYROXINE SODIUM 88 MCG PO TABS: ORAL_TABLET | ORAL | Status: DC

## 2011-01-26 NOTE — Patient Instructions (Signed)
Please call and let me know about the Mammogram Decrease her potassium to 53meq daily Set up an eye appointment Start taking synthroid 29mcg Return for labs in 6 weeks- please get before the visit Return for a visit in 6 weeks

## 2011-01-27 ENCOUNTER — Encounter: Payer: Self-pay | Admitting: Family Medicine

## 2011-01-27 DIAGNOSIS — G8929 Other chronic pain: Secondary | ICD-10-CM | POA: Insufficient documentation

## 2011-01-27 DIAGNOSIS — F79 Unspecified intellectual disabilities: Secondary | ICD-10-CM | POA: Insufficient documentation

## 2011-01-27 NOTE — Assessment & Plan Note (Addendum)
I will continue to assist her sister and tapering off her medications. At this time we will cut her back to 5 mg tablets of oxycodone she will take 2.5 mg as needed. She's no longer requiring medication during the daytime. At some point hopefully we can use this medication very sparingly as she does have hip pain from multiple surgeries. We'll try to utilize possibly trazodone to help with her restless sleep as often this is not due to actual pain.

## 2011-01-27 NOTE — Progress Notes (Signed)
  Subjective:    Patient ID: Shawna [name], female    DOB: [birth year], 55 y.o.   MRN: [id number]  HPI  patient here to establish care. She is now in the care of her older sister ( since 3/12). She has a history of mental retardation.    Shawna [name] is accompanied by her sister. Of note she was previously living with her brother at that time a lot of her care was done and cleans for [facility]. She was treated for mood disorder by her previous PCP. She has had 2 episodes of hypotensive event and illness which cannot be explained. The last resulted in a short-term stay at [facility]. Since then she has not had any trouble with her blood pressure and she has had increased weight gain. She appears to be happy in her current situation is and has had no difficulties with her mood. Notes and she is due with her sisters she has had a few visits with [facility] who have refilled her medications until she could be seen by myself would who will become her new primary care provider.  Mood- she has been on Zoloft in the past for her mood. However she is currently not taking this medication.  Hypokalemia- her sister and heart about her use of potassium supplement. Evidently while she was staying in [facility] home she has severe hypokalemia she was also noted to have many loose stools during that time and her potassium loss was thought to be secondary to this. At this point her bowel movements are normal and her diet has returned to its baseline. She is currently taking a total of 40 mEq of potassium daily.  Hypothyroidism- history of hypothyroidism during her stay at [facility] she had her thyroid medication increased to 100 mcg. Her last TSH was 0.174 in May of 2012. 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 She was maintained on higher doses of narcotics is currently in the process of tapering  off. She has many restless nights therefore utilizes 2.5 mg of oxycodone at this time.   Prevention- she has not had recent Mammogram, PAP smear Review of Systems  GEN- denies fatigue, fever, weight loss,weakness, recent illness CVS- denies chest pain, palpitations RESP- denies SOB, cough, wheeze ABD- denies N/V, change in stools, abd pain MSK- + joint pain, muscle aches,  Neuro- denies headache, dizziness, recent syncope, seizure activity     Objective:   Physical Exam GEN- NAD, alert and oriented x3 Neck- Supple, no thryomegaly CVS- RRR, no murmur RESP-CTAB EXT- 1+ Pedal edema Pulses- Radial, DP- 2+        Assessment & Plan:

## 2011-01-27 NOTE — Assessment & Plan Note (Signed)
Her sister [name] is her caregiver and health care power of attorney

## 2011-01-27 NOTE — Assessment & Plan Note (Signed)
I do not believe that she needs high doses of potassium at this time. She appears to have gained weight and her appetite is at baseline. I will decrease her to 20 mEq at this time and recheck her labs in 6 weeks by mouth. I will then take a step wise down on her potassium and have her return for labs again to make sure that she is able to hold her own.

## 2011-01-27 NOTE — Assessment & Plan Note (Signed)
Based on recent labs decrease Synthroid to 88 mcg daily recheck TSH in 6 weeks.

## 2011-02-23 ENCOUNTER — Encounter: Payer: Self-pay | Admitting: Family Medicine

## 2011-03-03 LAB — BASIC METABOLIC PANEL
CO2: 24 mEq/L (ref 19–32)
Calcium: 9.7 mg/dL (ref 8.4–10.5)
Glucose, Bld: 69 mg/dL — ABNORMAL LOW (ref 70–99)
Sodium: 136 mEq/L (ref 135–145)

## 2011-03-08 ENCOUNTER — Ambulatory Visit (INDEPENDENT_AMBULATORY_CARE_PROVIDER_SITE_OTHER): Payer: Medicare Other | Admitting: Family Medicine

## 2011-03-08 ENCOUNTER — Encounter: Payer: Self-pay | Admitting: Family Medicine

## 2011-03-08 VITALS — BP 140/90 | HR 82 | Resp 16 | Ht 65.0 in | Wt 126.1 lb

## 2011-03-08 DIAGNOSIS — Z23 Encounter for immunization: Secondary | ICD-10-CM

## 2011-03-08 DIAGNOSIS — E039 Hypothyroidism, unspecified: Secondary | ICD-10-CM

## 2011-03-08 DIAGNOSIS — G8929 Other chronic pain: Secondary | ICD-10-CM

## 2011-03-08 DIAGNOSIS — E876 Hypokalemia: Secondary | ICD-10-CM

## 2011-03-08 MED ORDER — LEVOTHYROXINE SODIUM 75 MCG PO TABS: ORAL_TABLET | ORAL | Status: DC

## 2011-03-08 MED ORDER — INFLUENZA VAC TYPES A & B PF IM SUSP: 0.5000 mL | Freq: Once | INTRAMUSCULAR | Status: DC

## 2011-03-08 MED ORDER — OXYCODONE HCL 5 MG PO TABS: 2.5000 mg | ORAL_TABLET | Freq: Every evening | ORAL | Status: DC | PRN

## 2011-03-08 NOTE — Assessment & Plan Note (Signed)
Refilled pain meds, this should last until November

## 2011-03-08 NOTE — Patient Instructions (Signed)
Decrease your synthroid to 69mcg a day  Continue the low dose pain medication Recheck your thryoid studies in 6-8 weeks, I will call with lab results Stop taking the potassium  F/U in 5 months

## 2011-03-08 NOTE — Progress Notes (Signed)
  Subjective:    Patient ID: Shawna [name], female    DOB: 01/19/1956, 55 y.o.   MRN: [id number]  HPI  Patient here to followup lab results. She is doing well has no concerns.  Hypothyroidism- patient's TSH is still low it appears she is still getting too much thyroid medication. She is currently on 88 mcg and tolerating this well. Review of systems- denies chest pain, palpitation, heat or cold intolerance, leg swelling is at baseline   Hypokalemia- past history of hypokalemia has been on potassium supplements since her hospitalization early this year. Her appetite is well her caregiver has no concerns. She denies any leg cramping. She is currently on 20 mEq of potassium as well as multivitamin.   Chronic pain- she continues to use 2.5 mg of oxycodone at bedtime secondary to chronic pain in hips since childhood, she needs a refill on her pain medication  Review of Systems Per above     Objective:   Physical Exam  GEN- NAD, alert and oriented  CVS- RRR, no murmur  Resp- anterior chest CTAB  EXT- trace pedal edema       Assessment & Plan:

## 2011-03-08 NOTE — Assessment & Plan Note (Signed)
Discussed that titrating thyroid medication can take some time. We'll decrease her to 75 mcg. She will have repeat labs in 6-8 weeks

## 2011-03-08 NOTE — Assessment & Plan Note (Signed)
Appetite is currently at goal , d/c extra potassium supplementation

## 2011-03-17 LAB — I-STAT 8, (EC8 V) (CONVERTED LAB)
Acid-Base Excess: 1
Glucose, Bld: 100 — ABNORMAL HIGH
HCT: 34 — ABNORMAL LOW
Hemoglobin: 11.6 — ABNORMAL LOW
Potassium: 3.8
Sodium: 138
TCO2: 26

## 2011-03-17 LAB — POCT I-STAT CREATININE
Creatinine, Ser: 1.9 — ABNORMAL HIGH
Operator id: 196461

## 2011-03-20 LAB — COMPREHENSIVE METABOLIC PANEL WITH GFR
ALT: 34
AST: 42 — ABNORMAL HIGH
Albumin: 3.4 — ABNORMAL LOW
Alkaline Phosphatase: 242 — ABNORMAL HIGH
BUN: 16
CO2: 27
Calcium: 9.4
Chloride: 87 — ABNORMAL LOW
Creatinine, Ser: 0.93
GFR calc non Af Amer: >60
Glucose, Bld: 106 — ABNORMAL HIGH
Potassium: 4.9
Sodium: 124 — ABNORMAL LOW
Total Bilirubin: 0.7
Total Protein: 6.8

## 2011-03-20 LAB — URINALYSIS, ROUTINE W REFLEX MICROSCOPIC
Bilirubin Urine: NEGATIVE
Glucose, UA: NEGATIVE
Hgb urine dipstick: NEGATIVE
Specific Gravity, Urine: 1.022
Urobilinogen, UA: 1

## 2011-03-20 LAB — DIFFERENTIAL
Basophils Absolute: 0.1
Basophils Relative: 1
Eosinophils Absolute: 0
Eosinophils Relative: 0
Lymphocytes Relative: 20
Lymphs Abs: 1.3
Monocytes Absolute: 0.6
Monocytes Relative: 9
Neutro Abs: 4.6
Neutrophils Relative %: 70

## 2011-03-20 LAB — BASIC METABOLIC PANEL
BUN: 4 — ABNORMAL LOW
BUN: 5 — ABNORMAL LOW
CO2: 29
Chloride: 93 — ABNORMAL LOW
Chloride: 99
Creatinine, Ser: 0.58
GFR calc Af Amer: >60
Glucose, Bld: 95
Potassium: 4.3

## 2011-03-20 LAB — CBC
HCT: 29.4 — ABNORMAL LOW
MCHC: 34.4
MCHC: 34.8
MCV: 89.3
Platelets: 370
RBC: 3.3 — ABNORMAL LOW
RDW: 12.8
RDW: 12.9

## 2011-03-20 LAB — CK TOTAL AND CKMB (NOT AT ARMC)
Relative Index: INVALID
Total CK: 71
Total CK: 76

## 2011-03-20 LAB — COMPREHENSIVE METABOLIC PANEL
ALT: 29
AST: 34
Calcium: 8.7
GFR calc Af Amer: >60
Sodium: 130 — ABNORMAL LOW
Total Protein: 5.9 — ABNORMAL LOW

## 2011-03-20 LAB — TSH: TSH: 0.631

## 2011-03-20 LAB — OSMOLALITY, URINE: Osmolality, Ur: 565

## 2011-03-20 LAB — URIC ACID: Uric Acid, Serum: 5

## 2011-05-24 ENCOUNTER — Telehealth: Payer: Self-pay | Admitting: Family Medicine

## 2011-05-24 NOTE — Telephone Encounter (Signed)
Received call from [name] (sister).  Informed of the decision by Dr. Moshe Cipro.  Sister stated that the pt is not out of "pain" pills yet and they will just follow up with next appointment.

## 2011-05-24 NOTE — Telephone Encounter (Signed)
Informed pt of Dr. Griffin Dakin decision.  Patient was not clear on what was said and stated that she would have her sister call back for her because she is the one who handles her medicine.

## 2011-05-24 NOTE — Telephone Encounter (Signed)
Dr Buelah Manis has "wean" on the pain list and in the last note she only said to continue the low dose pain med and it should last until Nov but I didn't see where she said to go from there. Please advise

## 2011-05-24 NOTE — Telephone Encounter (Signed)
No more meds from this office this is the direction to be taken let pt know

## 2011-08-10 ENCOUNTER — Ambulatory Visit (INDEPENDENT_AMBULATORY_CARE_PROVIDER_SITE_OTHER): Payer: Medicare Other | Admitting: Family Medicine

## 2011-08-10 ENCOUNTER — Encounter: Payer: Self-pay | Admitting: Family Medicine

## 2011-08-10 VITALS — BP 110/82 | HR 85 | Resp 16 | Ht 65.0 in | Wt 127.1 lb

## 2011-08-10 DIAGNOSIS — G8929 Other chronic pain: Secondary | ICD-10-CM

## 2011-08-10 DIAGNOSIS — M810 Age-related osteoporosis without current pathological fracture: Secondary | ICD-10-CM

## 2011-08-10 DIAGNOSIS — E039 Hypothyroidism, unspecified: Secondary | ICD-10-CM

## 2011-08-10 DIAGNOSIS — F79 Unspecified intellectual disabilities: Secondary | ICD-10-CM

## 2011-08-10 MED ORDER — OXYCODONE HCL 5 MG PO TABS: 2.5000 mg | ORAL_TABLET | Freq: Every evening | ORAL | Status: DC | PRN

## 2011-08-10 NOTE — Assessment & Plan Note (Signed)
She is coping very well with her new living situation. Her weight has improved, she appears very happy, has many interest and hobbies

## 2011-08-10 NOTE — Assessment & Plan Note (Signed)
Will continue with low dose Oxy IR at this time. Her sister manages medication, they are using as directed

## 2011-08-10 NOTE — Progress Notes (Signed)
  Subjective:    Patient ID: Shawna [name], female    DOB: 08-04-1955, 56 y.o.   MRN: [id number]  HPI    Pt here for routine visit, labs reviewed, no concerns No recent illness  Pain- patient has history of chronic pain in her bilateral hips. She ran out of her oxycodone was unable to get this refilled while I was out on vacation. She's been using Advil PM every night.   Hypothyroidism- denies fatigue cough, cold intolerance, chest discomfort  Dental visit addressed GYN exam to be done in April Immunizations reviewed  Review of Systems  GEN- denies fatigue, fever, weight loss,weakness, recent illness HEENT- denies eye drainage, change in vision, nasal discharge, CVS- denies chest pain, palpitations RESP- denies SOB, cough, wheeze ABD- denies N/V, change in stools, abd pain GU- denies dysuria, hematuria, dribbling, incontinence MSK- denies joint pain, muscle aches, injury Neuro- denies headache, dizziness, syncope, seizure activity      Objective:   Physical Exam GEN- NAD, alert and oriented x3, +weight gain 7lbs HEENT- PERRL, EOMI, non injected sclera, pink conjunctiva, MMM, oropharynx clear, fundoscopic exam benign Neck- Supple, no thyromegaly, no bruit CVS- RRR, no murmur RESP-CTAB ABD- NABS, soft, NT,ND  EXT- trace pedal edema Pulses- Radial, DP- 2+        Assessment & Plan:

## 2011-08-10 NOTE — Assessment & Plan Note (Signed)
Reviewed last set of labs, thyroid hormones within range, no change to dose

## 2011-08-10 NOTE — Assessment & Plan Note (Signed)
Calcium and Vit D supplment

## 2011-08-10 NOTE — Patient Instructions (Signed)
Dont forget to schedule your Mammogram and Women's health exam with your GYN Pain medications given for the next few months F/U in 6 months unless you have any other concerns Keep up the good work

## 2011-10-04 ENCOUNTER — Other Ambulatory Visit: Payer: Self-pay | Admitting: Family Medicine

## 2011-11-02 ENCOUNTER — Other Ambulatory Visit: Payer: Self-pay

## 2011-11-02 MED ORDER — OXYCODONE HCL 5 MG PO TABS: 2.5000 mg | ORAL_TABLET | Freq: Every evening | ORAL | Status: DC | PRN

## 2012-02-08 ENCOUNTER — Ambulatory Visit (INDEPENDENT_AMBULATORY_CARE_PROVIDER_SITE_OTHER): Payer: Medicare Other | Admitting: Family Medicine

## 2012-02-08 ENCOUNTER — Encounter: Payer: Self-pay | Admitting: Family Medicine

## 2012-02-08 VITALS — BP 134/80 | HR 81 | Resp 18 | Ht 65.0 in | Wt 134.1 lb

## 2012-02-08 DIAGNOSIS — Z1239 Encounter for other screening for malignant neoplasm of breast: Secondary | ICD-10-CM

## 2012-02-08 DIAGNOSIS — Z1231 Encounter for screening mammogram for malignant neoplasm of breast: Secondary | ICD-10-CM

## 2012-02-08 DIAGNOSIS — M810 Age-related osteoporosis without current pathological fracture: Secondary | ICD-10-CM

## 2012-02-08 DIAGNOSIS — E039 Hypothyroidism, unspecified: Secondary | ICD-10-CM

## 2012-02-08 DIAGNOSIS — G8929 Other chronic pain: Secondary | ICD-10-CM

## 2012-02-08 DIAGNOSIS — E785 Hyperlipidemia, unspecified: Secondary | ICD-10-CM

## 2012-02-08 MED ORDER — OXYCODONE HCL 5 MG PO TABS: 2.5000 mg | ORAL_TABLET | Freq: Every evening | ORAL | Status: DC | PRN

## 2012-02-08 NOTE — Assessment & Plan Note (Signed)
Recheck Lipid panel, discussed diet, LDL 149 on last check in 2012

## 2012-02-08 NOTE — Patient Instructions (Signed)
Orders for Bone Density and Mammogram  Labs done fasting  F/U 6 months

## 2012-02-08 NOTE — Assessment & Plan Note (Signed)
Well controlled on low dose oxycodone, refilled today

## 2012-02-08 NOTE — Progress Notes (Signed)
  Subjective:    Patient ID: Shawna [name], female    DOB: September 28, 1955, 56 y.o.   MRN: [id number]  HPI  Pt here to f/u hypothyroidism, no concerns, doing well. Still has not had Mammogram or PAP Smear, sister has been busy and has not set this up  Chronic Hip pain- uses 1/2 tablet every night of oxycodone. Medications reviewed   Review of Systems  GEN- denies fatigue, fever, weight loss,weakness, recent illness HEENT- denies eye drainage, change in vision, nasal discharge, CVS- denies chest pain, palpitations RESP- denies SOB, cough, wheeze ABD- denies N/V, change in stools, abd pain GU- denies dysuria, hematuria, dribbling, incontinence MSK- + joint pain, muscle aches, injury Neuro- denies headache, dizziness, syncope, seizure activity      Objective:   Physical Exam GEN- NAD, alert and oriented x3 HEENT- PERRL, EOMI, non injected sclera, pink conjunctiva, MMM, oropharynx clear Neck- Supple, no thyromegaly CVS- RRR, no murmur RESP-CTAB ABD-NABS,soft,NT,ND EXT- No edema Pulses- Radial, DP- 2+ Psych-normal affect and Mood        Assessment & Plan:

## 2012-02-08 NOTE — Assessment & Plan Note (Signed)
Recheck Bone density, continue calcium and Vit D

## 2012-02-08 NOTE — Assessment & Plan Note (Signed)
Continue synthroid, recheck TFT's

## 2012-05-15 ENCOUNTER — Other Ambulatory Visit: Payer: Self-pay | Admitting: Family Medicine

## 2012-06-04 ENCOUNTER — Telehealth: Payer: Self-pay | Admitting: Family Medicine

## 2012-06-04 DIAGNOSIS — Z1239 Encounter for other screening for malignant neoplasm of breast: Secondary | ICD-10-CM

## 2012-06-04 DIAGNOSIS — M810 Age-related osteoporosis without current pathological fracture: Secondary | ICD-10-CM

## 2012-06-04 NOTE — Telephone Encounter (Signed)
I do not understand the message, but new referral placed for Mammogram and Bone Density Please set up

## 2012-06-04 NOTE — Telephone Encounter (Signed)
I do not have any record of where she went before. She can schedule at either [facility] or she can schedule at the breast center in [place], orders have been placed

## 2012-06-04 NOTE — Telephone Encounter (Signed)
Sister states that wherever she had a mammogram and bone density done before does she need to go back there if so someone needs to go through her records from Dr. Garwin Brothers and see where she had been before. If not let sister know so she can schedule the appointments

## 2012-06-06 NOTE — Telephone Encounter (Signed)
Shawna [name] is aware of this and will schedule these appointments before her next visit

## 2012-07-09 ENCOUNTER — Telehealth: Payer: Self-pay | Admitting: Family Medicine

## 2012-07-30 ENCOUNTER — Ambulatory Visit (INDEPENDENT_AMBULATORY_CARE_PROVIDER_SITE_OTHER): Payer: Medicare Other | Admitting: Family Medicine

## 2012-07-30 ENCOUNTER — Encounter: Payer: Self-pay | Admitting: Family Medicine

## 2012-07-30 VITALS — BP 110/78 | HR 83 | Resp 16 | Ht 65.0 in | Wt 132.0 lb

## 2012-07-30 DIAGNOSIS — E785 Hyperlipidemia, unspecified: Secondary | ICD-10-CM

## 2012-07-30 DIAGNOSIS — E039 Hypothyroidism, unspecified: Secondary | ICD-10-CM

## 2012-07-30 DIAGNOSIS — F79 Unspecified intellectual disabilities: Secondary | ICD-10-CM

## 2012-07-30 DIAGNOSIS — G8929 Other chronic pain: Secondary | ICD-10-CM

## 2012-07-30 DIAGNOSIS — M810 Age-related osteoporosis without current pathological fracture: Secondary | ICD-10-CM

## 2012-07-30 MED ORDER — OXYCODONE HCL 5 MG PO TABS: 2.5000 mg | ORAL_TABLET | Freq: Every evening | ORAL | Status: DC | PRN

## 2012-07-30 NOTE — Patient Instructions (Addendum)
Get the labs done fasting Continue current medications Get the PAP Smear  I will get records from your mammogram and Bone Density  F/U 6 months

## 2012-07-30 NOTE — Progress Notes (Signed)
  Subjective:    Patient ID: Shawna [name], female    DOB: 16-Sep-1955, 56 y.o.   MRN: [id number]  HPI  Patient here to follow chronic medical problems. She has no specific concerns today. She did have a mammogram and bone density done however they were lacking one image that she has have her repeat mammogram this week. She uses her pain medication very sparingly for her hip. She does try to get out to be active with her sister could not had any behavioral problems he is tolerating her medications without any problems. She still has not had Pap smear done Review of Systems   GEN- denies fatigue, fever, weight loss,weakness, recent illness HEENT- denies eye drainage, change in vision, nasal discharge, CVS- denies chest pain, palpitations RESP- denies SOB, cough, wheeze ABD- denies N/V, change in stools, abd pain GU- denies dysuria, hematuria, dribbling, incontinence MSK- denies joint pain, muscle aches, injury Neuro- denies headache, dizziness, syncope, seizure activity      Objective:   Physical Exam  GEN- NAD, alert and oriented x3 HEENT- PERRL, EOMI, non injected sclera, pink conjunctiva, MMM, oropharynx clear Neck- Supple, no thyromegaly CVS- RRR, no murmur RESP-CTAB ABD-NABS,soft,NT,ND EXT- No edema Pulses- Radial, DP- 2+ Psych-normal affect and Mood       Assessment & Plan:

## 2012-07-31 NOTE — Assessment & Plan Note (Signed)
Plan to recheck thyroid studies, continue meds

## 2012-07-31 NOTE — Assessment & Plan Note (Signed)
Pain meds refilled 

## 2012-07-31 NOTE — Assessment & Plan Note (Signed)
Recent bone scan, obtain records , continue calcium vit D

## 2012-07-31 NOTE — Assessment & Plan Note (Signed)
Check FLP 

## 2012-07-31 NOTE — Assessment & Plan Note (Signed)
Doing well, no behavioral problems

## 2012-08-01 ENCOUNTER — Telehealth: Payer: Self-pay | Admitting: Family Medicine

## 2012-08-01 NOTE — Telephone Encounter (Signed)
Patient aware.

## 2012-08-01 NOTE — Telephone Encounter (Signed)
Please let pt know Mammogram left breast was normal, we will await the repeat on the right Bone Density looks good- continue calcium and Vitamin D, no further meds needed at this time

## 2012-08-01 NOTE — Telephone Encounter (Signed)
Called patient and left message for them to return call at the office   

## 2012-08-06 ENCOUNTER — Ambulatory Visit: Payer: Medicare Other

## 2012-08-06 ENCOUNTER — Other Ambulatory Visit: Payer: Medicare Other

## 2012-08-11 ENCOUNTER — Other Ambulatory Visit: Payer: Self-pay | Admitting: Family Medicine

## 2012-08-16 ENCOUNTER — Encounter: Payer: Self-pay | Admitting: Family Medicine

## 2012-12-16 ENCOUNTER — Other Ambulatory Visit: Payer: Self-pay | Admitting: Family Medicine

## 2012-12-16 NOTE — Telephone Encounter (Signed)
Med refilled.

## 2013-02-04 ENCOUNTER — Encounter: Payer: Self-pay | Admitting: Family Medicine

## 2013-02-04 ENCOUNTER — Other Ambulatory Visit: Payer: Self-pay | Admitting: Family Medicine

## 2013-02-04 ENCOUNTER — Ambulatory Visit: Payer: Medicare Other | Admitting: Family Medicine

## 2013-02-04 ENCOUNTER — Ambulatory Visit (INDEPENDENT_AMBULATORY_CARE_PROVIDER_SITE_OTHER): Payer: Medicare Other | Admitting: Family Medicine

## 2013-02-04 VITALS — BP 100/60 | HR 82 | Temp 97.4°F | Resp 16 | Wt 121.0 lb

## 2013-02-04 DIAGNOSIS — E785 Hyperlipidemia, unspecified: Secondary | ICD-10-CM

## 2013-02-04 DIAGNOSIS — G8929 Other chronic pain: Secondary | ICD-10-CM

## 2013-02-04 DIAGNOSIS — E039 Hypothyroidism, unspecified: Secondary | ICD-10-CM

## 2013-02-04 MED ORDER — OXYCODONE HCL 5 MG PO TABS: 2.5000 mg | ORAL_TABLET | Freq: Every evening | ORAL | Status: DC | PRN

## 2013-02-04 NOTE — Assessment & Plan Note (Signed)
Well controlled, meds refilled Chronic hip and back pain

## 2013-02-04 NOTE — Assessment & Plan Note (Signed)
Recheck thyroid level

## 2013-02-04 NOTE — Assessment & Plan Note (Signed)
Recheck Lipids

## 2013-02-04 NOTE — Progress Notes (Signed)
  Subjective:    Patient ID: Shawna [name], female    DOB: 14-Jun-1956, 57 y.o.   MRN: [id number]  HPI  Pt here to f/u chronic medical problems, no concerns, doing well, DUe for fasting labs Continues to use low dose pain medication at bedtime for chronic hip and back pain. No recent injuries.  Meds reviewed Sister here with her who is care giver, has no concerns    Review of Systems   GEN- denies fatigue, fever, weight loss,weakness, recent illness HEENT- denies eye drainage, change in vision, nasal discharge, CVS- denies chest pain, palpitations RESP- denies SOB, cough, wheeze ABD- denies N/V, change in stools, abd pain GU- denies dysuria, hematuria, dribbling, incontinence MSK- + joint pain, muscle aches, injury Neuro- denies headache, dizziness, syncope, seizure activity      Objective:   Physical Exam  GEN- NAD, alert and oriented x3 HEENT- PERRL, EOMI, non injected sclera, pink conjunctiva, MMM, oropharynx clear Neck- Supple, no thyromegaly CVS- RRR, no murmur RESP-CTAB EXT- No edema Pulses- Radial, DP- 2+       Assessment & Plan:

## 2013-02-04 NOTE — Patient Instructions (Signed)
We will call with lab results  Continue on same medications F/U 6 months

## 2013-02-05 LAB — CBC
MCH: 31.3 pg (ref 26.0–34.0)
MCHC: 34.8 g/dL (ref 30.0–36.0)
MCV: 89.9 fL (ref 78.0–100.0)
Platelets: 228 10*3/uL (ref 150–400)
RBC: 3.58 MIL/uL — ABNORMAL LOW (ref 3.87–5.11)
RDW: 13.6 % (ref 11.5–15.5)

## 2013-02-05 LAB — T4: T4, Total: 12.2 ug/dL (ref 5.0–12.5)

## 2013-02-05 LAB — LIPID PANEL
Cholesterol: 259 mg/dL — ABNORMAL HIGH (ref 0–200)
HDL: 60 mg/dL (ref >39)

## 2013-02-05 LAB — BASIC METABOLIC PANEL
CO2: 23 mEq/L (ref 19–32)
Calcium: 9.8 mg/dL (ref 8.4–10.5)
Creat: 1.15 mg/dL — ABNORMAL HIGH (ref 0.50–1.10)

## 2013-02-07 NOTE — Addendum Note (Signed)
Addended by: Daylene Posey T on: 02/07/2013 01:12 PM   Modules accepted: Orders

## 2013-02-19 MED ORDER — SIMVASTATIN 10 MG PO TABS: 10.0000 mg | ORAL_TABLET | Freq: Every day | ORAL | Status: DC

## 2013-02-19 NOTE — Addendum Note (Signed)
Addended by: Daylene Posey T on: 02/19/2013 05:04 PM   Modules accepted: Orders

## 2013-03-07 ENCOUNTER — Other Ambulatory Visit: Payer: Self-pay | Admitting: Family Medicine

## 2013-07-11 ENCOUNTER — Other Ambulatory Visit: Payer: Self-pay | Admitting: Family Medicine

## 2013-08-07 ENCOUNTER — Other Ambulatory Visit: Payer: Self-pay | Admitting: Family Medicine

## 2013-08-07 NOTE — Telephone Encounter (Signed)
Needs labs.  Has appt 08/12/13

## 2013-08-11 NOTE — Telephone Encounter (Signed)
Patient is aware 

## 2013-08-12 ENCOUNTER — Ambulatory Visit: Payer: Medicare Other | Admitting: Family Medicine

## 2013-08-14 ENCOUNTER — Telehealth: Payer: Self-pay | Admitting: Family Medicine

## 2013-08-14 NOTE — Telephone Encounter (Signed)
Call back number is [phone] Pt is needing a refill on levothyroxine (SYNTHROID, LEVOTHROID) 75 MCG tablet  Pharmacy is [facility]

## 2013-08-15 ENCOUNTER — Other Ambulatory Visit: Payer: Self-pay | Admitting: Family Medicine

## 2013-08-15 DIAGNOSIS — E039 Hypothyroidism, unspecified: Secondary | ICD-10-CM

## 2013-08-15 NOTE — Telephone Encounter (Signed)
Called patient was needing repeat thyroid lab done as ordered back in August.  Pt has appt on 08/26/13.  One month refill sent.

## 2013-08-26 ENCOUNTER — Ambulatory Visit (INDEPENDENT_AMBULATORY_CARE_PROVIDER_SITE_OTHER): Payer: Medicare Other | Admitting: Family Medicine

## 2013-08-26 ENCOUNTER — Other Ambulatory Visit: Payer: Self-pay | Admitting: Family Medicine

## 2013-08-26 ENCOUNTER — Encounter: Payer: Self-pay | Admitting: Family Medicine

## 2013-08-26 VITALS — BP 122/64 | HR 88 | Temp 97.6°F | Resp 16 | Ht 63.0 in | Wt 115.0 lb

## 2013-08-26 DIAGNOSIS — F79 Unspecified intellectual disabilities: Secondary | ICD-10-CM

## 2013-08-26 DIAGNOSIS — E039 Hypothyroidism, unspecified: Secondary | ICD-10-CM

## 2013-08-26 DIAGNOSIS — E785 Hyperlipidemia, unspecified: Secondary | ICD-10-CM

## 2013-08-26 DIAGNOSIS — G8929 Other chronic pain: Secondary | ICD-10-CM

## 2013-08-26 DIAGNOSIS — R634 Abnormal weight loss: Secondary | ICD-10-CM

## 2013-08-26 LAB — COMPREHENSIVE METABOLIC PANEL
ALK PHOS: 283 U/L — AB (ref 39–117)
ALT: 31 U/L (ref 0–35)
AST: 36 U/L (ref 0–37)
Albumin: 4.2 g/dL (ref 3.5–5.2)
BILIRUBIN TOTAL: 0.4 mg/dL (ref 0.2–1.2)
BUN: 29 mg/dL — AB (ref 6–23)
CO2: 23 mEq/L (ref 19–32)
CREATININE: 1.34 mg/dL — AB (ref 0.50–1.10)
Calcium: 10.2 mg/dL (ref 8.4–10.5)
Chloride: 100 mEq/L (ref 96–112)
GLUCOSE: 80 mg/dL (ref 70–99)
Potassium: 5 mEq/L (ref 3.5–5.3)
SODIUM: 134 meq/L — AB (ref 135–145)
TOTAL PROTEIN: 7.2 g/dL (ref 6.0–8.3)

## 2013-08-26 LAB — CBC WITH DIFFERENTIAL/PLATELET
Basophils Absolute: 0 10*3/uL (ref 0.0–0.1)
Basophils Relative: 0 % (ref 0–1)
EOS ABS: 0.1 10*3/uL (ref 0.0–0.7)
EOS PCT: 1 % (ref 0–5)
HEMATOCRIT: 31.9 % — AB (ref 36.0–46.0)
HEMOGLOBIN: 10.9 g/dL — AB (ref 12.0–15.0)
LYMPHS ABS: 1.9 10*3/uL (ref 0.7–4.0)
LYMPHS PCT: 33 % (ref 12–46)
MCH: 30.5 pg (ref 26.0–34.0)
MCHC: 34.2 g/dL (ref 30.0–36.0)
MCV: 89.4 fL (ref 78.0–100.0)
MONO ABS: 0.5 10*3/uL (ref 0.1–1.0)
MONOS PCT: 9 % (ref 3–12)
Neutro Abs: 3.3 10*3/uL (ref 1.7–7.7)
Neutrophils Relative %: 57 % (ref 43–77)
Platelets: 221 10*3/uL (ref 150–400)
RBC: 3.57 MIL/uL — AB (ref 3.87–5.11)
RDW: 14 % (ref 11.5–15.5)
WBC: 5.8 10*3/uL (ref 4.0–10.5)

## 2013-08-26 LAB — LIPID PANEL
CHOL/HDL RATIO: 3.9 ratio
CHOLESTEROL: 263 mg/dL — AB (ref 0–200)
HDL: 68 mg/dL (ref >39)
LDL Cholesterol: 171 mg/dL — ABNORMAL HIGH (ref 0–99)
TRIGLYCERIDES: 121 mg/dL (ref <150)
VLDL: 24 mg/dL (ref 0–40)

## 2013-08-26 LAB — T3, FREE: T3, Free: 2.8 pg/mL (ref 2.3–4.2)

## 2013-08-26 LAB — T4, FREE: Free T4: 1.26 ng/dL (ref 0.80–1.80)

## 2013-08-26 LAB — TSH: TSH: 0.165 u[IU]/mL — AB (ref 0.350–4.500)

## 2013-08-26 MED ORDER — OXYCODONE HCL 5 MG PO TABS: 2.5000 mg | ORAL_TABLET | Freq: Every evening | ORAL | Status: DC | PRN

## 2013-08-26 NOTE — Progress Notes (Signed)
Patient ID: Shawna [name], female   DOB: [birth year], 58 y.o.   MRN: [id number]   Subjective:    Patient ID: Shawna [name], female    DOB: 10-16-1955, 58 y.o.   MRN: [id number]  Patient presents for 6 month F/U and medication review  Patient here for medication review. She has no specific concerns. She's here with her sister who is her primary caregiver. He states she's been doing well. It is noted that she lost 5 pounds since her last visit. Often she will eat very little in the evening time if her sister is not there to prepare her dinner for her. Her appetite in general is good and she has normal bowel movements.  Chronic hip pain she continues to use her pain medication as well as occasional Tylenol PM for pain. She requests a refill of this today.  Her lipidemia-her last LDL was elevated to 180 with increase in her total cholesterol as well. She was started on Zocor 10 mg however she had muscle aches with this. I never received a message from the family that she had difficulties but they went ahead and stopped the medication which I agree with.  Hypothyroidism she is due for repeat thyroid studies  Her mammogram is now in view and she is still not had a GYN exam   Review Of Systems:  GEN- denies fatigue, fever, weight loss,weakness, recent illness HEENT- denies eye drainage, change in vision, nasal discharge, CVS- denies chest pain, palpitations RESP- denies SOB, cough, wheeze ABD- denies N/V, change in stools, abd pain GU- denies dysuria, hematuria, dribbling, incontinence MSK- + joint pain, muscle aches, injury Neuro- denies headache, dizziness, syncope, seizure activity       Objective:    BP 122/64  Pulse 88  Temp(Src) 97.6 F (36.4 C)  Resp 16  Ht 5\' 3"  (1.6 m)  Wt 115 lb (52.164 kg)  BMI 20.38 kg/m2 GEN- NAD, alert and oriented x3 HEENT- PERRL, EOMI, non injected sclera, pink conjunctiva, MMM, oropharynx clear Neck- Supple, no hypothyroidism CVS- RRR, no  murmur RESP-CTAB ABD-NABS,soft,NT,ND EXT- No edema Pulses- Radial 2+        Assessment & Plan:      Problem List Items Addressed This Visit   None    Visit Diagnoses   Other and unspecified hyperlipidemia    -  Primary    Relevant Orders       Comprehensive metabolic panel       CBC with Differential    Unspecified hypothyroidism           Note: This dictation was prepared with Dragon dictation along with smaller phrase technology. Any transcriptional errors that result from this process are unintentional.

## 2013-08-26 NOTE — Assessment & Plan Note (Signed)
Chronic hip and leg pain refilled her oxycodone

## 2013-08-26 NOTE — Patient Instructions (Signed)
Increase Fruits and veggies Eat at least 3 meals a day  We will call with lab results and instructions Please schedule mammogram and GYN exam  F/U 6 months

## 2013-08-26 NOTE — Assessment & Plan Note (Addendum)
We will need to monitor her weight. Encouraged her to make meals in the afternoon if she can repeat her sister will assist her with this.  Avoid the importance of having her GYN exam done in staying on top of her mammograms. Her sister did not want me to schedule thisshe wants to go to the same GYN is her Dr. Raphael Gibney

## 2013-08-26 NOTE — Assessment & Plan Note (Signed)
Labs to be done they will refill medications accordingly

## 2013-08-26 NOTE — Assessment & Plan Note (Signed)
Unchanged she is doing well [place]

## 2013-08-26 NOTE — Assessment & Plan Note (Signed)
Recheck fasting lipid panel. She may be a good candidate for zetia, but will like to hold off on any other statin drugs in his yard he has underlying arthritis

## 2013-08-27 LAB — IRON AND TIBC
%SAT: 16 % — ABNORMAL LOW (ref 20–55)
IRON: 74 ug/dL (ref 42–145)
TIBC: 465 ug/dL (ref 250–470)
UIBC: 391 ug/dL (ref 125–400)

## 2013-08-27 MED ORDER — EZETIMIBE 10 MG PO TABS: 10.0000 mg | ORAL_TABLET | Freq: Every day | ORAL | Status: DC

## 2013-08-27 MED ORDER — LEVOTHYROXINE SODIUM 50 MCG PO TABS: ORAL_TABLET | ORAL | Status: DC

## 2013-08-27 NOTE — Addendum Note (Signed)
Addended by: Vic Blackbird F on: 08/27/2013 04:59 PM   Modules accepted: Orders

## 2013-09-24 ENCOUNTER — Telehealth: Payer: Self-pay | Admitting: Family Medicine

## 2013-09-24 MED ORDER — FENOFIBRATE 48 MG PO TABS: 48.0000 mg | ORAL_TABLET | Freq: Every day | ORAL | Status: DC

## 2013-09-24 NOTE — Telephone Encounter (Signed)
Call back number is [place]  Pt is needing something else other than ezetimibe (ZETIA) 10 MG tablet because this medication cost to much

## 2013-09-24 NOTE — Telephone Encounter (Signed)
MD please advise

## 2013-09-24 NOTE — Telephone Encounter (Signed)
Trial of tricor instead

## 2013-09-25 NOTE — Telephone Encounter (Signed)
Call placed to patient and patient made aware.  

## 2013-10-07 ENCOUNTER — Other Ambulatory Visit: Payer: Medicare Other

## 2013-10-07 DIAGNOSIS — E039 Hypothyroidism, unspecified: Secondary | ICD-10-CM

## 2013-10-07 LAB — T4, FREE: Free T4: 1.14 ng/dL (ref 0.80–1.80)

## 2013-10-07 LAB — TSH: TSH: 2.098 u[IU]/mL (ref 0.350–4.500)

## 2013-10-07 LAB — T3, FREE: T3, Free: 2.2 pg/mL — ABNORMAL LOW (ref 2.3–4.2)

## 2013-12-21 ENCOUNTER — Other Ambulatory Visit: Payer: Self-pay | Admitting: Family Medicine

## 2013-12-22 NOTE — Telephone Encounter (Signed)
Refill appropriate and filled per protocol. 

## 2014-03-03 ENCOUNTER — Encounter: Payer: Self-pay | Admitting: Family Medicine

## 2014-03-03 ENCOUNTER — Ambulatory Visit (INDEPENDENT_AMBULATORY_CARE_PROVIDER_SITE_OTHER): Payer: Medicare Other | Admitting: Family Medicine

## 2014-03-03 VITALS — BP 130/68 | HR 68 | Temp 98.0°F | Resp 14 | Ht 63.0 in | Wt 115.0 lb

## 2014-03-03 DIAGNOSIS — E038 Other specified hypothyroidism: Secondary | ICD-10-CM

## 2014-03-03 DIAGNOSIS — E785 Hyperlipidemia, unspecified: Secondary | ICD-10-CM

## 2014-03-03 DIAGNOSIS — G8929 Other chronic pain: Secondary | ICD-10-CM

## 2014-03-03 LAB — COMPREHENSIVE METABOLIC PANEL
ALBUMIN: 4.6 g/dL (ref 3.5–5.2)
ALT: 16 U/L (ref 0–35)
AST: 30 U/L (ref 0–37)
Alkaline Phosphatase: 70 U/L (ref 39–117)
BUN: 39 mg/dL — AB (ref 6–23)
CALCIUM: 10.1 mg/dL (ref 8.4–10.5)
CHLORIDE: 104 meq/L (ref 96–112)
CO2: 25 mEq/L (ref 19–32)
Creat: 1.9 mg/dL — ABNORMAL HIGH (ref 0.50–1.10)
Glucose, Bld: 80 mg/dL (ref 70–99)
Potassium: 5.1 mEq/L (ref 3.5–5.3)
Sodium: 137 mEq/L (ref 135–145)
Total Bilirubin: 0.6 mg/dL (ref 0.2–1.2)
Total Protein: 7.1 g/dL (ref 6.0–8.3)

## 2014-03-03 LAB — LIPID PANEL
CHOL/HDL RATIO: 2.9 ratio
CHOLESTEROL: 170 mg/dL (ref 0–200)
HDL: 59 mg/dL (ref >39)
LDL Cholesterol: 99 mg/dL (ref 0–99)
Triglycerides: 61 mg/dL (ref <150)
VLDL: 12 mg/dL (ref 0–40)

## 2014-03-03 MED ORDER — OXYCODONE HCL 5 MG PO TABS: 2.5000 mg | ORAL_TABLET | Freq: Every evening | ORAL | Status: DC | PRN

## 2014-03-03 NOTE — Assessment & Plan Note (Signed)
Pain medication refilled, very sparingly use

## 2014-03-03 NOTE — Assessment & Plan Note (Signed)
TFT are stable, reviewed last set of labs

## 2014-03-03 NOTE — Assessment & Plan Note (Signed)
Recheck labs, continue tricor

## 2014-03-03 NOTE — Progress Notes (Signed)
Patient ID: Shawna [name], female   DOB: 06/15/1956, 58 y.o.   MRN: [id number]   Subjective:    Patient ID: Shawna [name], female    DOB: 11/02/1955, 58 y.o.   MRN: [id number]  Patient presents for 6 month F/U  patient here follow chronic medical problems. There no specific concerns. Her weight has been stable 115 pounds her appetite is fairly good. She is sleeping well. She's barely uses her oxycodone for her chronic arthritis of the hips. She's here today with her sister. Her medications were reviewed she is due for fasting labs for her cholesterol    Review Of Systems:  GEN- denies fatigue, fever, weight loss,weakness, recent illness HEENT- denies eye drainage, change in vision, nasal discharge, CVS- denies chest pain, palpitations RESP- denies SOB, cough, wheeze ABD- denies N/V, change in stools, abd pain GU- denies dysuria, hematuria, dribbling, incontinence MSK- + joint pain, muscle aches, injury Neuro- denies headache, dizziness, syncope, seizure activity       Objective:    BP 130/68  Pulse 68  Temp(Src) 98 F (36.7 C) (Oral)  Resp 14  Ht 5\' 3"  (1.6 m)  Wt 115 lb (52.164 kg)  BMI 20.38 kg/m2 GEN- NAD, alert and oriented x3 HEENT- PERRL, EOMI, non injected sclera, pink conjunctiva, MMM, oropharynx clear Neck- Supple, no thyromegaly CVS- RRR, no murmur RESP-CTAB EXT- No edema Pulses- Radial 2+        Assessment & Plan:    Discussed need for Mammogram, sister to set up   Problem List Items Addressed This Visit   Hyperlipidemia - Primary   Relevant Orders      Comprehensive metabolic panel      Lipid panel      Note: This dictation was prepared with Dragon dictation along with smaller phrase technology. Any transcriptional errors that result from this process are unintentional.

## 2014-03-03 NOTE — Patient Instructions (Signed)
Continue current medications  Mammogram -please schedule F/U 6 months

## 2014-03-05 ENCOUNTER — Other Ambulatory Visit: Payer: Self-pay | Admitting: *Deleted

## 2014-03-05 DIAGNOSIS — N189 Chronic kidney disease, unspecified: Secondary | ICD-10-CM

## 2014-03-19 ENCOUNTER — Other Ambulatory Visit: Payer: Medicare Other

## 2014-03-19 DIAGNOSIS — N189 Chronic kidney disease, unspecified: Secondary | ICD-10-CM

## 2014-03-19 LAB — URINALYSIS, ROUTINE W REFLEX MICROSCOPIC
BILIRUBIN URINE: NEGATIVE
GLUCOSE, UA: NEGATIVE mg/dL
Hgb urine dipstick: NEGATIVE
Ketones, ur: NEGATIVE mg/dL
Nitrite: NEGATIVE
PH: 5.5 (ref 5.0–8.0)
PROTEIN: NEGATIVE mg/dL
Specific Gravity, Urine: 1.015 (ref 1.005–1.030)
Urobilinogen, UA: 0.2 mg/dL (ref 0.0–1.0)

## 2014-03-19 LAB — URINALYSIS, MICROSCOPIC ONLY
Casts: NONE SEEN
Crystals: NONE SEEN

## 2014-03-19 LAB — BASIC METABOLIC PANEL WITH GFR
BUN: 37 mg/dL — ABNORMAL HIGH (ref 6–23)
CALCIUM: 9.1 mg/dL (ref 8.4–10.5)
CHLORIDE: 107 meq/L (ref 96–112)
CO2: 23 meq/L (ref 19–32)
Creat: 1.93 mg/dL — ABNORMAL HIGH (ref 0.50–1.10)
GFR, Est African American: 33 mL/min — ABNORMAL LOW
GFR, Est Non African American: 28 mL/min — ABNORMAL LOW
GLUCOSE: 77 mg/dL (ref 70–99)
Potassium: 4.8 mEq/L (ref 3.5–5.3)
Sodium: 141 mEq/L (ref 135–145)

## 2014-03-23 ENCOUNTER — Other Ambulatory Visit: Payer: Self-pay | Admitting: *Deleted

## 2014-03-23 DIAGNOSIS — N189 Chronic kidney disease, unspecified: Secondary | ICD-10-CM

## 2014-03-24 ENCOUNTER — Encounter: Payer: Self-pay | Admitting: Family Medicine

## 2014-04-06 ENCOUNTER — Other Ambulatory Visit: Payer: Self-pay | Admitting: Family Medicine

## 2014-04-06 ENCOUNTER — Other Ambulatory Visit: Payer: Medicare Other

## 2014-04-06 DIAGNOSIS — N183 Chronic kidney disease, stage 3 unspecified: Secondary | ICD-10-CM

## 2014-04-06 LAB — CBC
HCT: 26.8 % — ABNORMAL LOW (ref 36.0–46.0)
HEMOGLOBIN: 9.7 g/dL — AB (ref 12.0–15.0)
MCH: 31.8 pg (ref 26.0–34.0)
MCHC: 36.2 g/dL — ABNORMAL HIGH (ref 30.0–36.0)
MCV: 87.9 fL (ref 78.0–100.0)
PLATELETS: 187 10*3/uL (ref 150–400)
RBC: 3.05 MIL/uL — AB (ref 3.87–5.11)
RDW: 12.6 % (ref 11.5–15.5)
WBC: 3.9 10*3/uL — AB (ref 4.0–10.5)

## 2014-04-06 LAB — BASIC METABOLIC PANEL WITH GFR
BUN: 35 mg/dL — ABNORMAL HIGH (ref 6–23)
CHLORIDE: 98 meq/L (ref 96–112)
CO2: 24 mEq/L (ref 19–32)
Calcium: 9.6 mg/dL (ref 8.4–10.5)
Creat: 1.72 mg/dL — ABNORMAL HIGH (ref 0.50–1.10)
GFR, Est African American: 38 mL/min — ABNORMAL LOW
GFR, Est Non African American: 33 mL/min — ABNORMAL LOW
GLUCOSE: 82 mg/dL (ref 70–99)
POTASSIUM: 4.8 meq/L (ref 3.5–5.3)
SODIUM: 132 meq/L — AB (ref 135–145)

## 2014-04-07 ENCOUNTER — Other Ambulatory Visit: Payer: Self-pay | Admitting: Radiology

## 2014-04-07 ENCOUNTER — Encounter: Payer: Self-pay | Admitting: Family Medicine

## 2014-04-07 LAB — IRON AND TIBC
%SAT: 24 % (ref 20–55)
IRON: 105 ug/dL (ref 42–145)
TIBC: 444 ug/dL (ref 250–470)
UIBC: 339 ug/dL (ref 125–400)

## 2014-04-25 ENCOUNTER — Other Ambulatory Visit: Payer: Self-pay | Admitting: Family Medicine

## 2014-04-27 ENCOUNTER — Encounter: Payer: Self-pay | Admitting: Family Medicine

## 2014-04-27 NOTE — Telephone Encounter (Signed)
Refill appropriate and filled per protocol. 

## 2014-04-28 ENCOUNTER — Encounter: Payer: Self-pay | Admitting: Family Medicine

## 2014-05-13 ENCOUNTER — Other Ambulatory Visit: Payer: Self-pay | Admitting: Family Medicine

## 2014-05-13 ENCOUNTER — Telehealth: Payer: Self-pay | Admitting: *Deleted

## 2014-05-13 DIAGNOSIS — Z1211 Encounter for screening for malignant neoplasm of colon: Secondary | ICD-10-CM

## 2014-05-13 NOTE — Telephone Encounter (Signed)
Colonoscopy ordered in wq

## 2014-05-13 NOTE — Telephone Encounter (Signed)
-----   Message from Roney Marion sent at 05/13/2014 12:59 PM EST ----- Regarding: colonoscopy [place]  Patient needs a Colonoscopy.  Thanks [employer]

## 2014-05-27 ENCOUNTER — Other Ambulatory Visit: Payer: Self-pay | Admitting: Nephrology

## 2014-05-27 DIAGNOSIS — N179 Acute kidney failure, unspecified: Secondary | ICD-10-CM

## 2014-05-27 DIAGNOSIS — E785 Hyperlipidemia, unspecified: Secondary | ICD-10-CM

## 2014-06-02 ENCOUNTER — Other Ambulatory Visit (HOSPITAL_COMMUNITY): Payer: Self-pay | Admitting: *Deleted

## 2014-06-03 ENCOUNTER — Encounter (HOSPITAL_COMMUNITY): Payer: Medicare Other

## 2014-06-08 ENCOUNTER — Ambulatory Visit
Admission: RE | Admit: 2014-06-08 | Discharge: 2014-06-08 | Disposition: A | Discharge status: Home / Self Care | Payer: Medicare Other | Source: Ambulatory Visit | Attending: Nephrology | Admitting: Nephrology

## 2014-06-08 DIAGNOSIS — N179 Acute kidney failure, unspecified: Secondary | ICD-10-CM

## 2014-06-08 DIAGNOSIS — E785 Hyperlipidemia, unspecified: Secondary | ICD-10-CM

## 2014-06-09 ENCOUNTER — Encounter: Payer: Self-pay | Admitting: Family Medicine

## 2014-06-12 ENCOUNTER — Encounter (HOSPITAL_COMMUNITY): Payer: Medicare Other

## 2014-08-26 ENCOUNTER — Other Ambulatory Visit: Payer: Self-pay | Admitting: Family Medicine

## 2014-08-26 NOTE — Telephone Encounter (Signed)
Appt pending.  One month refills sent

## 2014-09-01 ENCOUNTER — Encounter: Payer: Self-pay | Admitting: Family Medicine

## 2014-09-01 ENCOUNTER — Ambulatory Visit (INDEPENDENT_AMBULATORY_CARE_PROVIDER_SITE_OTHER): Payer: Medicare Other | Admitting: Family Medicine

## 2014-09-01 VITALS — BP 102/60 | HR 58 | Temp 97.7°F | Resp 14 | Ht 63.0 in | Wt 111.0 lb

## 2014-09-01 DIAGNOSIS — G8929 Other chronic pain: Secondary | ICD-10-CM

## 2014-09-01 DIAGNOSIS — E038 Other specified hypothyroidism: Secondary | ICD-10-CM | POA: Diagnosis not present

## 2014-09-01 DIAGNOSIS — E785 Hyperlipidemia, unspecified: Secondary | ICD-10-CM | POA: Diagnosis not present

## 2014-09-01 DIAGNOSIS — F79 Unspecified intellectual disabilities: Secondary | ICD-10-CM

## 2014-09-01 MED ORDER — TRAMADOL HCL 50 MG PO TABS: 50.0000 mg | ORAL_TABLET | Freq: Three times a day (TID) | ORAL | Status: DC | PRN

## 2014-09-01 MED ORDER — OXYCODONE HCL 5 MG PO TABS: 2.5000 mg | ORAL_TABLET | Freq: Every evening | ORAL | Status: DC | PRN

## 2014-09-01 NOTE — Patient Instructions (Signed)
Try the ultram as needed for pain  Get the labs done fasting  Continue current medications F/U 6 months- PHYSICAL

## 2014-09-01 NOTE — Progress Notes (Signed)
Patient ID: Shawna [name], female   DOB: Jun 24, 1956, 59 y.o.   MRN: [id number]   Subjective:    Patient ID: Shawna [name], female    DOB: 1955/07/19, 59 y.o.   MRN: [id number]  Patient presents for 6 month F/U  patient here to follow-up medical problems. She is seeing her nephrologist on Friday for chronic kidney disease due to mostly overuse of anti-inflammatories. She still has chronic hip pain her sister will like to try something a step above Tylenol to see she can get by with that as it haven't used oxycodone that she does not use this on a regular basis. She is due for repeat cholesterol as well as thyroid function tests. She did have an abnormal mammogram however biopsy came back benign she will have a follow-up mammogram in the next 1-2 months    Review Of Systems:  GEN- denies fatigue, fever, weight loss,weakness, recent illness HEENT- denies eye drainage, change in vision, nasal discharge, CVS- denies chest pain, palpitations RESP- denies SOB, cough, wheeze ABD- denies N/V, change in stools, abd pain GU- denies dysuria, hematuria, dribbling, incontinence MSK- + joint pain, muscle aches, injury Neuro- denies headache, dizziness, syncope, seizure activity       Objective:    BP 102/60 mmHg  Pulse 58  Temp(Src) 97.7 F (36.5 C) (Oral)  Resp 14  Ht 5\' 3"  (1.6 m)  Wt 111 lb (50.349 kg)  BMI 19.67 kg/m2 GEN- NAD, alert and oriented x3 HEENT- PERRL, EOMI, non injected sclera, pink conjunctiva, MMM, oropharynx clear Neck- Supple, no thyromegaly CVS- RRR, no murmur RESP-CTAB EXT- No edema Pulses- Radial2+        Assessment & Plan:      Problem List Items Addressed This Visit    None      Note: This dictation was prepared with Dragon dictation along with smaller phrase technology. Any transcriptional errors that result from this process are unintentional.

## 2014-09-01 NOTE — Assessment & Plan Note (Signed)
Chronic hip pain from multiple surgeries as a child. She is not using the oxycodone IR very often. I'm going to try her on a low-dose tramadol C if she can get by with this. I did go ahead and refill the oxycodone today just in case the tramadol does not work. Her sister is in charge of her medications

## 2014-09-01 NOTE — Assessment & Plan Note (Signed)
Repeat thyroid function tests she will get these done with her fasting labs at her kidney doctors office on Friday

## 2014-09-01 NOTE — Assessment & Plan Note (Signed)
Recheck cholesterol panel as well as liver function tests

## 2014-09-04 DIAGNOSIS — I959 Hypotension, unspecified: Secondary | ICD-10-CM | POA: Diagnosis not present

## 2014-09-04 DIAGNOSIS — E785 Hyperlipidemia, unspecified: Secondary | ICD-10-CM | POA: Diagnosis not present

## 2014-09-04 DIAGNOSIS — N179 Acute kidney failure, unspecified: Secondary | ICD-10-CM | POA: Diagnosis not present

## 2014-09-04 DIAGNOSIS — G8929 Other chronic pain: Secondary | ICD-10-CM | POA: Diagnosis not present

## 2014-09-04 DIAGNOSIS — D509 Iron deficiency anemia, unspecified: Secondary | ICD-10-CM | POA: Diagnosis not present

## 2014-09-11 ENCOUNTER — Telehealth: Payer: Self-pay | Admitting: Family Medicine

## 2014-09-11 DIAGNOSIS — E039 Hypothyroidism, unspecified: Secondary | ICD-10-CM

## 2014-09-11 NOTE — Telephone Encounter (Signed)
Call placed to patient sister. Glade.

## 2014-09-11 NOTE — Telephone Encounter (Signed)
Call pt sister, cholesterol looks good, no change to Tricor   Thyroid is off, she needs to change to synthroid 62mcg once a day unless for some reason she has missed the medication.   Send new script  Repeat thyroid labs in 8 weeks

## 2014-09-14 MED ORDER — LEVOTHYROXINE SODIUM 75 MCG PO TABS: 75.0000 ug | ORAL_TABLET | Freq: Every day | ORAL | Status: DC

## 2014-09-14 NOTE — Addendum Note (Signed)
Addended by: Sheral Flow on: 09/14/2014 08:50 AM   Modules accepted: Orders, Medications

## 2014-09-14 NOTE — Telephone Encounter (Signed)
Call placed to patient and patient sister, Benjamine Mola made aware.   Prescription sent to pharmacy.   Lab orders placed.

## 2014-09-16 ENCOUNTER — Other Ambulatory Visit (HOSPITAL_COMMUNITY): Payer: Self-pay | Admitting: *Deleted

## 2014-09-17 ENCOUNTER — Encounter (HOSPITAL_COMMUNITY): Payer: Self-pay

## 2014-10-01 ENCOUNTER — Other Ambulatory Visit: Payer: Self-pay | Admitting: Family Medicine

## 2014-10-01 NOTE — Telephone Encounter (Signed)
Refill appropriate and filled per protocol. 

## 2014-10-23 DIAGNOSIS — R921 Mammographic calcification found on diagnostic imaging of breast: Secondary | ICD-10-CM | POA: Diagnosis not present

## 2014-11-20 ENCOUNTER — Encounter: Payer: Self-pay | Admitting: Family Medicine

## 2014-12-25 ENCOUNTER — Emergency Department (HOSPITAL_BASED_OUTPATIENT_CLINIC_OR_DEPARTMENT_OTHER)
Admission: EM | Admit: 2014-12-25 | Discharge: 2014-12-25 | Disposition: A | Discharge status: Home / Self Care | Payer: Medicare Other | Attending: Emergency Medicine | Admitting: Emergency Medicine

## 2014-12-25 ENCOUNTER — Emergency Department (HOSPITAL_BASED_OUTPATIENT_CLINIC_OR_DEPARTMENT_OTHER): Payer: Medicare Other

## 2014-12-25 ENCOUNTER — Encounter (HOSPITAL_BASED_OUTPATIENT_CLINIC_OR_DEPARTMENT_OTHER): Payer: Self-pay | Admitting: *Deleted

## 2014-12-25 ENCOUNTER — Telehealth: Payer: Self-pay | Admitting: Family Medicine

## 2014-12-25 DIAGNOSIS — Z79899 Other long term (current) drug therapy: Secondary | ICD-10-CM | POA: Diagnosis not present

## 2014-12-25 DIAGNOSIS — Z7982 Long term (current) use of aspirin: Secondary | ICD-10-CM | POA: Insufficient documentation

## 2014-12-25 DIAGNOSIS — E079 Disorder of thyroid, unspecified: Secondary | ICD-10-CM | POA: Insufficient documentation

## 2014-12-25 DIAGNOSIS — Z862 Personal history of diseases of the blood and blood-forming organs and certain disorders involving the immune mechanism: Secondary | ICD-10-CM | POA: Diagnosis not present

## 2014-12-25 DIAGNOSIS — R109 Unspecified abdominal pain: Secondary | ICD-10-CM

## 2014-12-25 DIAGNOSIS — M810 Age-related osteoporosis without current pathological fracture: Secondary | ICD-10-CM | POA: Insufficient documentation

## 2014-12-25 DIAGNOSIS — N390 Urinary tract infection, site not specified: Secondary | ICD-10-CM | POA: Diagnosis not present

## 2014-12-25 DIAGNOSIS — G8929 Other chronic pain: Secondary | ICD-10-CM | POA: Insufficient documentation

## 2014-12-25 LAB — URINE MICROSCOPIC-ADD ON

## 2014-12-25 LAB — COMPREHENSIVE METABOLIC PANEL
ALBUMIN: 4.3 g/dL (ref 3.5–5.0)
ALT: 22 U/L (ref 14–54)
AST: 37 U/L (ref 15–41)
Alkaline Phosphatase: 60 U/L (ref 38–126)
Anion gap: 11 (ref 5–15)
BUN: 36 mg/dL — ABNORMAL HIGH (ref 6–20)
CO2: 23 mmol/L (ref 22–32)
Calcium: 9.3 mg/dL (ref 8.9–10.3)
Chloride: 103 mmol/L (ref 101–111)
Creatinine, Ser: 2.07 mg/dL — ABNORMAL HIGH (ref 0.44–1.00)
GFR calc Af Amer: 29 mL/min — ABNORMAL LOW (ref >60)
GFR, EST NON AFRICAN AMERICAN: 25 mL/min — AB (ref >60)
GLUCOSE: 95 mg/dL (ref 65–99)
Potassium: 4.6 mmol/L (ref 3.5–5.1)
Sodium: 137 mmol/L (ref 135–145)
TOTAL PROTEIN: 7.1 g/dL (ref 6.5–8.1)
Total Bilirubin: 0.6 mg/dL (ref 0.3–1.2)

## 2014-12-25 LAB — CBC WITH DIFFERENTIAL/PLATELET
BASOS ABS: 0 10*3/uL (ref 0.0–0.1)
Basophils Relative: 0 % (ref 0–1)
EOS ABS: 0 10*3/uL (ref 0.0–0.7)
Eosinophils Relative: 0 % (ref 0–5)
HCT: 26.5 % — ABNORMAL LOW (ref 36.0–46.0)
Hemoglobin: 8.7 g/dL — ABNORMAL LOW (ref 12.0–15.0)
Lymphocytes Relative: 26 % (ref 12–46)
Lymphs Abs: 1.2 10*3/uL (ref 0.7–4.0)
MCH: 30.6 pg (ref 26.0–34.0)
MCHC: 32.8 g/dL (ref 30.0–36.0)
MCV: 93.3 fL (ref 78.0–100.0)
Monocytes Absolute: 0.4 10*3/uL (ref 0.1–1.0)
Monocytes Relative: 9 % (ref 3–12)
NEUTROS ABS: 3 10*3/uL (ref 1.7–7.7)
NEUTROS PCT: 65 % (ref 43–77)
Platelets: 164 10*3/uL (ref 150–400)
RBC: 2.84 MIL/uL — ABNORMAL LOW (ref 3.87–5.11)
RDW: 12 % (ref 11.5–15.5)
WBC: 4.7 10*3/uL (ref 4.0–10.5)

## 2014-12-25 LAB — URINALYSIS, ROUTINE W REFLEX MICROSCOPIC
BILIRUBIN URINE: NEGATIVE
GLUCOSE, UA: NEGATIVE mg/dL
HGB URINE DIPSTICK: NEGATIVE
Ketones, ur: NEGATIVE mg/dL
Nitrite: NEGATIVE
Protein, ur: NEGATIVE mg/dL
Specific Gravity, Urine: 1.015 (ref 1.005–1.030)
UROBILINOGEN UA: 0.2 mg/dL (ref 0.0–1.0)
pH: 5.5 (ref 5.0–8.0)

## 2014-12-25 MED ORDER — MORPHINE SULFATE 4 MG/ML IJ SOLN
4.0000 mg | Freq: Once | INTRAMUSCULAR | Status: AC
  Administered 2014-12-25: 4 mg via INTRAVENOUS
  Filled 2014-12-25: qty 1

## 2014-12-25 MED ORDER — CEPHALEXIN 500 MG PO CAPS: 500.0000 mg | ORAL_CAPSULE | Freq: Two times a day (BID) | ORAL | Status: DC

## 2014-12-25 MED ORDER — DEXTROSE 5 % IV SOLN
1.0000 g | Freq: Once | INTRAVENOUS | Status: AC
  Administered 2014-12-25: 1 g via INTRAVENOUS

## 2014-12-25 MED ORDER — OXYCODONE-ACETAMINOPHEN 5-325 MG PO TABS: 1.0000 | ORAL_TABLET | Freq: Four times a day (QID) | ORAL | Status: DC | PRN

## 2014-12-25 MED ORDER — ONDANSETRON HCL 4 MG/2ML IJ SOLN
4.0000 mg | Freq: Once | INTRAMUSCULAR | Status: AC
  Administered 2014-12-25: 4 mg via INTRAVENOUS
  Filled 2014-12-25: qty 2

## 2014-12-25 MED ORDER — CEFTRIAXONE SODIUM 1 G IJ SOLR
INTRAMUSCULAR | Status: AC
  Filled 2014-12-25: qty 10

## 2014-12-25 NOTE — ED Provider Notes (Signed)
CSN: [id number]     Arrival date & time 12/25/14  1614 History   First MD Initiated Contact with Patient 12/25/14 1641     No chief complaint on file.   The history is provided by the patient and a relative. No language interpreter was used.   Shawna [name] presents for evaluation of left flank pain. History is provided by the patient and her niece. She reports 2-3 days of pain in her left flank. Pain is described as sharp and constant nature. She denies any recent injuries. No fevers, vomiting, diarrhea. There are no alleviating or worsening factors. She has a history of kidney disease. Symptoms are moderate, constant, worsening.  Past Medical History  Diagnosis Date  . Thyroid disease   . Chronic pain   . Osteoporosis     s/p forteo  . Iron deficiency anemia    Past Surgical History  Procedure Laterality Date  . Cholecystectomy    . Joint replacement      right hip x 3 surgeries   No family history on file. History  Substance Use Topics  . Smoking status: Never Smoker   . Smokeless tobacco: Not on file  . Alcohol Use: No   OB History    No data available     Review of Systems  All other systems reviewed and are negative.     Allergies  Bisphosphonates  Home Medications   Prior to Admission medications   Medication Sig Start Date End Date Taking? Authorizing Provider  aspirin 81 MG tablet Take 81 mg by mouth daily.     Yes Historical Provider, MD  calcium gluconate 500 MG tablet Take 500 mg by mouth 2 (two) times daily.     Yes Historical Provider, MD  fenofibrate (TRICOR) 48 MG tablet TAKE ONE (1) TABLET EACH DAY FOR CHOLESTEROL 10/01/14  Yes Alycia Rossetti, MD  levothyroxine (SYNTHROID, LEVOTHROID) 75 MCG tablet Take 1 tablet (75 mcg total) by mouth daily. 09/14/14  Yes Alycia Rossetti, MD  Multiple Vitamins-Minerals (CENTRUM SILVER) CHEW Chew 1 tablet by mouth daily.     Yes Historical Provider, MD  oxyCODONE (OXY IR/ROXICODONE) 5 MG immediate release tablet Take 0.5  tablets (2.5 mg total) by mouth at bedtime as needed. 09/01/14  Yes Alycia Rossetti, MD   BP 110/48 mmHg  Pulse 88  Temp(Src) 98.2 F (36.8 C) (Oral)  Resp 18  Ht 5\' 4"  (1.626 m)  Wt 106 lb (48.081 kg)  BMI 18.19 kg/m2  SpO2 99% Physical Exam  Constitutional: She is oriented to person, place, and time. She appears well-developed and well-nourished.  HENT:  Head: Normocephalic and atraumatic.  Cardiovascular: Normal rate and regular rhythm.   No murmur heard. Pulmonary/Chest: Effort normal and breath sounds normal. No respiratory distress.  Abdominal: Soft. There is no rebound and no guarding.  Mild to moderate left sided abdominal tenderness without guarding or rebound. Mild left CVA tenderness. No rash  Musculoskeletal: She exhibits no edema or tenderness.  Neurological: She is alert and oriented to person, place, and time.  Skin: Skin is warm and dry.  Psychiatric: She has a normal mood and affect. Her behavior is normal.  Nursing note and vitals reviewed.   ED Course  Procedures (including critical care time) Labs Review Labs Reviewed  URINALYSIS, ROUTINE W REFLEX MICROSCOPIC (NOT AT [facility]) - Abnormal; Notable for the following:    APPearance CLOUDY (*)    Leukocytes, UA MODERATE (*)    All other components within  normal limits  URINE MICROSCOPIC-ADD ON - Abnormal; Notable for the following:    Squamous Epithelial / LPF FEW (*)    Bacteria, UA FEW (*)    All other components within normal limits  COMPREHENSIVE METABOLIC PANEL - Abnormal; Notable for the following:    BUN 36 (*)    Creatinine, Ser 2.07 (*)    GFR calc non Af Amer 25 (*)    GFR calc Af Amer 29 (*)    All other components within normal limits  CBC WITH DIFFERENTIAL/PLATELET - Abnormal; Notable for the following:    RBC 2.84 (*)    Hemoglobin 8.7 (*)    HCT 26.5 (*)    All other components within normal limits  URINE CULTURE    Imaging Review Ct Renal Stone Study  12/25/2014   CLINICAL DATA:  Left  flank pain for 2 days.  EXAM: CT ABDOMEN AND PELVIS WITHOUT CONTRAST  TECHNIQUE: Multidetector CT imaging of the abdomen and pelvis was performed following the standard protocol without IV contrast.  COMPARISON:  August 16, 2010  FINDINGS: The liver is unremarkable. Patient status post prior cholecystectomy. Calcified granuloma is identified within the spleen. Pancreas is normal. The adrenal glands are normal. There is no nephrolithiasis or hydroureteronephrosis bilaterally. There is atherosclerosis of the abdominal aorta without aneurysmal dilatation. There is no abdominal lymphadenopathy.  There is no small bowel obstruction or diverticulitis. The appendix is not seen but no inflammation is noted around cecum.  Fluid-filled bladder is normal. There is left pelvic phleboliths unchanged compared to prior exam on image 50. The uterus is not seen. The visualized lung bases are clear. Degenerative joint changes of the spine are noted. Right hip replacement is unchanged.  IMPRESSION: No nephrolithiasis or hydroureteronephrosis bilaterally.  No acute abnormality identified in the abdomen and pelvis.   Electronically Signed   By: Abelardo Diesel M.D.   On: 12/25/2014 18:06     EKG Interpretation   Date/Time:  Friday December 25 2014 18:36:30 EDT Ventricular Rate:  78 PR Interval:  162 QRS Duration: 74 QT Interval:  358 QTC Calculation: 408 R Axis:   68 Text Interpretation:  Normal sinus rhythm Possible Left atrial enlargement  Borderline ECG Confirmed by Hazle Coca [phone]) on 12/25/2014 6:44:35 PM      MDM   Final diagnoses:  Flank pain  Acute UTI    Patient here for evaluation of left flank pain, history is limited due to developmental delay. UA is concerning for possible urinary tract infection, treating with Keflex. History and presentation is not consistent with ACS, PE, aortic dissection. Patient is anemic, denies any hematochezia or melena, has a history of anemia. She also has a history of renal  disease, BMP are her baseline. Discussed with patient and caregiver homecare for flank pain as well as outpatient follow-up for recheck.      Quintella Reichert, MD 12/25/14 2009

## 2014-12-25 NOTE — Telephone Encounter (Signed)
Family member calling.  Pt with severe abd pain.  Crying.  Pain just under rib cage.  Advised to go to ED or UC for evaluation.

## 2014-12-25 NOTE — ED Notes (Addendum)
MD at bedside discussing test results and dispo plan of care. 

## 2014-12-25 NOTE — ED Notes (Signed)
C/o left mid side pain that is achey. No injury. Also c/o left back pain. No urinary sx.

## 2014-12-25 NOTE — Discharge Instructions (Signed)
Flank Pain °Flank pain refers to pain that is located on the side of the body between the upper abdomen and the back. The pain may occur over a short period of time (acute) or may be long-term or reoccurring (chronic). It may be mild or severe. Flank pain can be caused by many things. °CAUSES  °Some of the more common causes of flank pain include: °· Muscle strains.   °· Muscle spasms.   °· A disease of your spine (vertebral disk disease).   °· A lung infection (pneumonia).   °· Fluid around your lungs (pulmonary edema).   °· A kidney infection.   °· Kidney stones.   °· A very painful skin rash caused by the chickenpox virus (shingles).   °· Gallbladder disease.   °HOME CARE INSTRUCTIONS  °Home care will depend on the cause of your pain. In general, °· Rest as directed by your caregiver. °· Drink enough fluids to keep your urine clear or pale yellow. °· Only take over-the-counter or prescription medicines as directed by your caregiver. Some medicines may help relieve the pain. °· Tell your caregiver about any changes in your pain. °· Follow up with your caregiver as directed. °SEEK IMMEDIATE MEDICAL CARE IF:  °· Your pain is not controlled with medicine.   °· You have new or worsening symptoms. °· Your pain increases.   °· You have abdominal pain.   °· You have shortness of breath.   °· You have persistent nausea or vomiting.   °· You have swelling in your abdomen.   °· You feel faint or pass out.   °· You have blood in your urine. °· You have a fever or persistent symptoms for more than 2-3 days. °· You have a fever and your symptoms suddenly get worse. °MAKE SURE YOU:  °· Understand these instructions. °· Will watch your condition. °· Will get help right away if you are not doing well or get worse. °Document Released: 08/03/2005 Document Revised: 03/06/2012 Document Reviewed: 01/25/2012 °ExitCare® Patient Information ©2015 [employer]. This information is not intended to replace advice given to you by your  health care provider. Make sure you discuss any questions you have with your health care provider. ° °

## 2014-12-25 NOTE — Telephone Encounter (Signed)
Agree with above 

## 2014-12-27 LAB — URINE CULTURE

## 2015-01-01 ENCOUNTER — Encounter (HOSPITAL_BASED_OUTPATIENT_CLINIC_OR_DEPARTMENT_OTHER): Payer: Self-pay | Admitting: *Deleted

## 2015-01-01 ENCOUNTER — Emergency Department (HOSPITAL_BASED_OUTPATIENT_CLINIC_OR_DEPARTMENT_OTHER): Payer: Medicare Other

## 2015-01-01 ENCOUNTER — Emergency Department (HOSPITAL_BASED_OUTPATIENT_CLINIC_OR_DEPARTMENT_OTHER)
Admission: EM | Admit: 2015-01-01 | Discharge: 2015-01-01 | Disposition: A | Discharge status: Home / Self Care | Payer: Medicare Other | Attending: Emergency Medicine | Admitting: Emergency Medicine

## 2015-01-01 ENCOUNTER — Encounter: Payer: Self-pay | Admitting: Family Medicine

## 2015-01-01 ENCOUNTER — Ambulatory Visit (INDEPENDENT_AMBULATORY_CARE_PROVIDER_SITE_OTHER): Payer: Medicare Other | Admitting: Family Medicine

## 2015-01-01 VITALS — BP 124/60 | HR 76 | Temp 98.4°F | Resp 12 | Ht 64.0 in | Wt 103.0 lb

## 2015-01-01 DIAGNOSIS — D649 Anemia, unspecified: Secondary | ICD-10-CM | POA: Diagnosis not present

## 2015-01-01 DIAGNOSIS — Z862 Personal history of diseases of the blood and blood-forming organs and certain disorders involving the immune mechanism: Secondary | ICD-10-CM | POA: Insufficient documentation

## 2015-01-01 DIAGNOSIS — M1990 Unspecified osteoarthritis, unspecified site: Secondary | ICD-10-CM | POA: Insufficient documentation

## 2015-01-01 DIAGNOSIS — Z9049 Acquired absence of other specified parts of digestive tract: Secondary | ICD-10-CM | POA: Diagnosis not present

## 2015-01-01 DIAGNOSIS — M545 Low back pain, unspecified: Secondary | ICD-10-CM

## 2015-01-01 DIAGNOSIS — E860 Dehydration: Secondary | ICD-10-CM

## 2015-01-01 DIAGNOSIS — Z79899 Other long term (current) drug therapy: Secondary | ICD-10-CM | POA: Insufficient documentation

## 2015-01-01 DIAGNOSIS — G8929 Other chronic pain: Secondary | ICD-10-CM | POA: Diagnosis not present

## 2015-01-01 DIAGNOSIS — R1032 Left lower quadrant pain: Secondary | ICD-10-CM | POA: Diagnosis not present

## 2015-01-01 DIAGNOSIS — R109 Unspecified abdominal pain: Secondary | ICD-10-CM | POA: Diagnosis present

## 2015-01-01 DIAGNOSIS — N189 Chronic kidney disease, unspecified: Secondary | ICD-10-CM | POA: Insufficient documentation

## 2015-01-01 DIAGNOSIS — R55 Syncope and collapse: Secondary | ICD-10-CM | POA: Diagnosis not present

## 2015-01-01 DIAGNOSIS — Z7982 Long term (current) use of aspirin: Secondary | ICD-10-CM | POA: Insufficient documentation

## 2015-01-01 DIAGNOSIS — N183 Chronic kidney disease, stage 3 unspecified: Secondary | ICD-10-CM

## 2015-01-01 DIAGNOSIS — R112 Nausea with vomiting, unspecified: Secondary | ICD-10-CM | POA: Diagnosis not present

## 2015-01-01 DIAGNOSIS — E079 Disorder of thyroid, unspecified: Secondary | ICD-10-CM | POA: Insufficient documentation

## 2015-01-01 DIAGNOSIS — S3992XA Unspecified injury of lower back, initial encounter: Secondary | ICD-10-CM | POA: Diagnosis not present

## 2015-01-01 DIAGNOSIS — R1111 Vomiting without nausea: Secondary | ICD-10-CM

## 2015-01-01 LAB — CBC WITH DIFFERENTIAL/PLATELET
Basophils Absolute: 0 10*3/uL (ref 0.0–0.1)
Basophils Relative: 0 % (ref 0–1)
EOS ABS: 0 10*3/uL (ref 0.0–0.7)
EOS PCT: 0 % (ref 0–5)
HCT: 29.1 % — ABNORMAL LOW (ref 36.0–46.0)
Hemoglobin: 9.9 g/dL — ABNORMAL LOW (ref 12.0–15.0)
LYMPHS ABS: 1.3 10*3/uL (ref 0.7–4.0)
Lymphocytes Relative: 20 % (ref 12–46)
MCH: 30.9 pg (ref 26.0–34.0)
MCHC: 34 g/dL (ref 30.0–36.0)
MCV: 90.9 fL (ref 78.0–100.0)
MONO ABS: 0.4 10*3/uL (ref 0.1–1.0)
Monocytes Relative: 7 % (ref 3–12)
Neutro Abs: 4.8 10*3/uL (ref 1.7–7.7)
Neutrophils Relative %: 73 % (ref 43–77)
PLATELETS: 192 10*3/uL (ref 150–400)
RBC: 3.2 MIL/uL — AB (ref 3.87–5.11)
RDW: 11.7 % (ref 11.5–15.5)
WBC: 6.5 10*3/uL (ref 4.0–10.5)

## 2015-01-01 LAB — CBC W/MCH & 3 PART DIFF
HCT: 26.4 % — ABNORMAL LOW (ref 36.0–46.0)
Hemoglobin: 9.2 g/dL — ABNORMAL LOW (ref 12.0–15.0)
Lymphocytes Relative: 17 % (ref 12–46)
Lymphs Abs: 1.1 10*3/uL (ref 0.7–4.0)
MCH: 31.4 pg (ref 26.0–34.0)
MCHC: 34.8 g/dL (ref 30.0–36.0)
MCV: 90.1 fL (ref 78.0–100.0)
NEUTROS PCT: 77 % (ref 43–77)
Neutro Abs: 4.8 10*3/uL (ref 1.7–7.7)
PLATELETS: 171 10*3/uL (ref 150–400)
RBC: 2.93 MIL/uL — ABNORMAL LOW (ref 3.87–5.11)
RDW: 12.4 % (ref 11.5–15.5)
WBC mixed population: 0.4 10*3/uL (ref 0.1–1.8)
WBC: 6.2 10*3/uL (ref 4.0–10.5)
WBCMIXPER: 6 % (ref 3–18)

## 2015-01-01 LAB — COMPREHENSIVE METABOLIC PANEL
ALT: 28 U/L (ref 0–35)
AST: 46 U/L — ABNORMAL HIGH (ref 0–37)
Albumin: 4.1 g/dL (ref 3.5–5.2)
Alkaline Phosphatase: 75 U/L (ref 39–117)
BILIRUBIN TOTAL: 0.6 mg/dL (ref 0.2–1.2)
BUN: 26 mg/dL — ABNORMAL HIGH (ref 6–23)
CO2: 21 mEq/L (ref 19–32)
CREATININE: 1.44 mg/dL — AB (ref 0.50–1.10)
Calcium: 9.4 mg/dL (ref 8.4–10.5)
Chloride: 97 mEq/L (ref 96–112)
Glucose, Bld: 114 mg/dL — ABNORMAL HIGH (ref 70–99)
Potassium: 4.6 mEq/L (ref 3.5–5.3)
Sodium: 131 mEq/L — ABNORMAL LOW (ref 135–145)
TOTAL PROTEIN: 6.6 g/dL (ref 6.0–8.3)

## 2015-01-01 LAB — BASIC METABOLIC PANEL
Anion gap: 12 (ref 5–15)
BUN: 27 mg/dL — ABNORMAL HIGH (ref 6–20)
CALCIUM: 9.5 mg/dL (ref 8.9–10.3)
CO2: 23 mmol/L (ref 22–32)
Chloride: 96 mmol/L — ABNORMAL LOW (ref 101–111)
Creatinine, Ser: 1.49 mg/dL — ABNORMAL HIGH (ref 0.44–1.00)
GFR calc Af Amer: 44 mL/min — ABNORMAL LOW (ref >60)
GFR calc non Af Amer: 38 mL/min — ABNORMAL LOW (ref >60)
GLUCOSE: 105 mg/dL — AB (ref 65–99)
Potassium: 4.6 mmol/L (ref 3.5–5.1)
SODIUM: 131 mmol/L — AB (ref 135–145)

## 2015-01-01 LAB — URINALYSIS, ROUTINE W REFLEX MICROSCOPIC
BILIRUBIN URINE: NEGATIVE
Glucose, UA: NEGATIVE mg/dL
Hgb urine dipstick: NEGATIVE
KETONES UR: 15 mg/dL — AB
Leukocytes, UA: NEGATIVE
NITRITE: NEGATIVE
PROTEIN: NEGATIVE mg/dL
SPECIFIC GRAVITY, URINE: 1.015 (ref 1.005–1.030)
UROBILINOGEN UA: 1 mg/dL (ref 0.0–1.0)
pH: 6 (ref 5.0–8.0)

## 2015-01-01 LAB — IRON AND TIBC
%SAT: 31 % (ref 20–55)
Iron: 127 ug/dL (ref 42–145)
TIBC: 411 ug/dL (ref 250–470)
UIBC: 284 ug/dL (ref 125–400)

## 2015-01-01 MED ORDER — SODIUM CHLORIDE 0.9 % IV BOLUS (SEPSIS)
1000.0000 mL | Freq: Once | INTRAVENOUS | Status: AC
  Administered 2015-01-01: 1000 mL via INTRAVENOUS

## 2015-01-01 MED ORDER — ONDANSETRON HCL 4 MG/2ML IJ SOLN
4.0000 mg | Freq: Once | INTRAMUSCULAR | Status: AC
  Administered 2015-01-01: 4 mg via INTRAVENOUS
  Filled 2015-01-01: qty 2

## 2015-01-01 MED ORDER — FENTANYL CITRATE (PF) 100 MCG/2ML IJ SOLN
50.0000 ug | Freq: Once | INTRAMUSCULAR | Status: AC
  Administered 2015-01-01: 50 ug via INTRAVENOUS
  Filled 2015-01-01: qty 2

## 2015-01-01 MED ORDER — ONDANSETRON 4 MG PO TBDP: ORAL_TABLET | ORAL | Status: DC

## 2015-01-01 MED ORDER — METOCLOPRAMIDE HCL 5 MG/ML IJ SOLN
10.0000 mg | Freq: Once | INTRAMUSCULAR | Status: AC
  Administered 2015-01-01: 10 mg via INTRAVENOUS
  Filled 2015-01-01: qty 2

## 2015-01-01 MED ORDER — MORPHINE SULFATE 4 MG/ML IJ SOLN
6.0000 mg | Freq: Once | INTRAMUSCULAR | Status: AC
  Administered 2015-01-01: 6 mg via INTRAVENOUS
  Filled 2015-01-01: qty 2

## 2015-01-01 NOTE — Discharge Instructions (Signed)
Dehydration, Adult Dehydration is when you lose more fluids from the body than you take in. Vital organs like the kidneys, brain, and heart cannot function without a proper amount of fluids and salt. Any loss of fluids from the body can cause dehydration.  CAUSES   Vomiting.  Diarrhea.  Excessive sweating.  Excessive urine output.  Fever. SYMPTOMS  Mild dehydration  Thirst.  Dry lips.  Slightly dry mouth. Moderate dehydration  Very dry mouth.  Sunken eyes.  Skin does not bounce back quickly when lightly pinched and released.  Dark urine and decreased urine production.  Decreased tear production.  Headache. Severe dehydration  Very dry mouth.  Extreme thirst.  Rapid, weak pulse (more than 100 beats per minute at rest).  Cold hands and feet.  Not able to sweat in spite of heat and temperature.  Rapid breathing.  Blue lips.  Confusion and lethargy.  Difficulty being awakened.  Minimal urine production.  No tears. DIAGNOSIS  Your caregiver will diagnose dehydration based on your symptoms and your exam. Blood and urine tests will help confirm the diagnosis. The diagnostic evaluation should also identify the cause of dehydration. TREATMENT  Treatment of mild or moderate dehydration can often be done at home by increasing the amount of fluids that you drink. It is best to drink small amounts of fluid more often. Drinking too much at one time can make vomiting worse. Refer to the home care instructions below. Severe dehydration needs to be treated at the hospital where you will probably be given intravenous (IV) fluids that contain water and electrolytes. HOME CARE INSTRUCTIONS   Ask your caregiver about specific rehydration instructions.  Drink enough fluids to keep your urine clear or pale yellow.  Drink small amounts frequently if you have nausea and vomiting.  Eat as you normally do.  Avoid:  Foods or drinks high in sugar.  Carbonated  drinks.  Juice.  Extremely hot or cold fluids.  Drinks with caffeine.  Fatty, greasy foods.  Alcohol.  Tobacco.  Overeating.  Gelatin desserts.  Wash your hands well to avoid spreading bacteria and viruses.  Only take over-the-counter or prescription medicines for pain, discomfort, or fever as directed by your caregiver.  Ask your caregiver if you should continue all prescribed and over-the-counter medicines.  Keep all follow-up appointments with your caregiver. SEEK MEDICAL CARE IF:  You have abdominal pain and it increases or stays in one area (localizes).  You have a rash, stiff neck, or severe headache.  You are irritable, sleepy, or difficult to awaken.  You are weak, dizzy, or extremely thirsty. SEEK IMMEDIATE MEDICAL CARE IF:   You are unable to keep fluids down or you get worse despite treatment.  You have frequent episodes of vomiting or diarrhea.  You have blood or green matter (bile) in your vomit.  You have blood in your stool or your stool looks black and tarry.  You have not urinated in 6 to 8 hours, or you have only urinated a small amount of very dark urine.  You have a fever.  You faint. MAKE SURE YOU:   Understand these instructions.  Will watch your condition.  Will get help right away if you are not doing well or get worse. Document Released: 06/12/2005 Document Revised: 09/04/2011 Document Reviewed: 01/30/2011 ExitCare Patient Information 2015 [employer]. This information is not intended to replace advice given to you by your health care provider. Make sure you discuss any questions you have with your health care   provider.  

## 2015-01-01 NOTE — ED Notes (Signed)
Patient transported to X-ray 

## 2015-01-01 NOTE — Progress Notes (Signed)
Patient ID: Shawna [name], female   DOB: 01/10/1956, 59 y.o.   MRN: [id number]   Subjective:    Patient ID: Shawna [name], female    DOB: May 22, 1956, 59 y.o.   MRN: [id number]  Patient presents for UC F/U patient here with her niece. She was seen in the ER on July 1st with abdominal pain. CT scan showed no acute abnormality, diagnosed with UTI given some fluids and sent home. Labs reviewed chronic anemia was worse down to 8.7 no sign of active bleeding, creatinine up from baseline at 2.0. She is still not eating and drinking well, keflex makes her sick on the stomach. Her niece also tells me she passed out 2 weeks ago and hit her back, she has been complaining of pain since then as well. This was not evaluated at the ER.     Review Of Systems:  GEN- denies fatigue, fever, weight loss,weakness, recent illness HEENT- denies eye drainage, change in vision, nasal discharge, CVS- denies chest pain, palpitations RESP- denies SOB, cough, wheeze ABD- denies N/V, change in stools, +abd pain GU- denies dysuria, hematuria, dribbling, incontinence MSK- +j oint pain, muscle aches, injury Neuro- denies headache, dizziness, syncope, seizure activity       Objective:    BP 124/60 mmHg  Pulse 76  Temp(Src) 98.4 F (36.9 C) (Oral)  Resp 12  Ht 5\' 4"  (1.626 m)  Wt 103 lb (46.72 kg)  BMI 17.67 kg/m2 GEN- NAD, alert and oriented x3,crying in pain  HEENT- PERRL, EOMI, non injected sclera, pink conjunctiva, MM a little dry , oropharynx clear Neck- Supple, CVS- RRR, no murmur RESP-CTAB ABD-NABS,soft,mild TTP LLQ, no rebound, no guarding, no CVA tenderness MSK- TTP thoracic and lumbar spine, fair ROM, no bruising noted  EXT- No edema Pulses- Radial, - 2+        Assessment & Plan:     Discussed plan with her Niece Shawna [id number]   Problem List Items Addressed This Visit    None    Visit Diagnoses    Anemia, unspecified anemia type    -  Primary    CBC in office rechecked Hb 9.2  improved, baseline between 9-10, no sign of acute bleeding    Relevant Orders    CBC w/[facility] & 3 Part Diff (Completed)    Iron and TIBC    Comprehensive metabolic panel    CKD (chronic kidney disease), stage III        Relevant Orders    Comprehensive metabolic panel    Dehydration        worsening renal function, in setting of dehydration, needs IVF, my nurses attempted unable to get a vein, seen to ED for fluids, Cr is pending    Bilateral low back pain without sciatica        Concen for possible stress fracture, needs imaging will be done in ER, pt has chronic pain meds    LLQ pain        CT no sign of coliitis, no change in bowels, Urine culture showed multiple morphotypes,on antibiotics       Note: This dictation was prepared with Dragon dictation along with smaller phrase technology. Any transcriptional errors that result from this process are unintentional.

## 2015-01-01 NOTE — Patient Instructions (Signed)
Go to [facility] ED  Xray of back to be done IV fluids Pain medications Kidney recheck

## 2015-01-01 NOTE — ED Provider Notes (Signed)
CSN: [id number]     Arrival date & time 01/01/15  1421 History   First MD Initiated Contact with Patient 01/01/15 1457     Chief Complaint  Patient presents with  . Abdominal Pain     (Consider location/radiation/quality/duration/timing/severity/associated sxs/prior Treatment) HPI Comments: Seen one week ago for flank pain. CT Renal showed no nephrolithiasis. Treated for UTI with Keflex. Went to PCP today for persistent pain, decreased PO intake.  Patient is a 59 y.o. female presenting with abdominal pain. The history is provided by the patient.  Abdominal Pain Pain location:  L flank and R flank Pain quality: sharp   Pain radiates to:  Does not radiate Pain severity:  Moderate Onset quality:  Gradual Duration:  1 week Timing:  Constant Progression:  Unchanged Chronicity:  Recurrent Context: not sick contacts   Relieved by:  Nothing Worsened by:  Nothing tried Associated symptoms: no chest pain, no chills, no cough, no fever, no shortness of breath and no vomiting     Past Medical History  Diagnosis Date  . Thyroid disease   . Chronic pain   . Osteoporosis     s/p forteo  . Iron deficiency anemia   . Chronic kidney disease   . Arthritis   . Hyperlipidemia    Past Surgical History  Procedure Laterality Date  . Cholecystectomy    . Joint replacement      right hip x 3 surgeries   History reviewed. No pertinent family history. History  Substance Use Topics  . Smoking status: Never Smoker   . Smokeless tobacco: Not on file  . Alcohol Use: No   OB History    No data available     Review of Systems  Constitutional: Negative for fever and chills.  Respiratory: Negative for cough and shortness of breath.   Cardiovascular: Negative for chest pain and leg swelling.  Gastrointestinal: Negative for vomiting and abdominal pain.  All other systems reviewed and are negative.     Allergies  Bisphosphonates  Home Medications   Prior to Admission medications    Medication Sig Start Date End Date Taking? Authorizing Provider  aspirin 81 MG tablet Take 81 mg by mouth daily.      Historical Provider, MD  calcium gluconate 500 MG tablet Take 500 mg by mouth 2 (two) times daily.      Historical Provider, MD  cephALEXin (KEFLEX) 500 MG capsule  12/26/14   Historical Provider, MD  fenofibrate (TRICOR) 48 MG tablet TAKE ONE (1) TABLET EACH DAY FOR CHOLESTEROL 10/01/14   Alycia Rossetti, MD  levothyroxine (SYNTHROID, LEVOTHROID) 75 MCG tablet Take 1 tablet (75 mcg total) by mouth daily. 09/14/14   Alycia Rossetti, MD  Multiple Vitamins-Minerals (CENTRUM SILVER) CHEW Chew 1 tablet by mouth daily.      Historical Provider, MD  oxyCODONE (OXY IR/ROXICODONE) 5 MG immediate release tablet Take 0.5 tablets (2.5 mg total) by mouth at bedtime as needed. 09/01/14   Alycia Rossetti, MD  oxyCODONE-acetaminophen (PERCOCET/ROXICET) 5-325 MG per tablet Take 1 tablet by mouth every 6 (six) hours as needed for severe pain. 12/25/14   Quintella Reichert, MD   BP 155/68 mmHg  Pulse 88  Temp(Src) 98.3 F (36.8 C)  Resp 16  SpO2 98% Physical Exam  Constitutional: She is oriented to person, place, and time. She appears well-developed and well-nourished. No distress.  HENT:  Head: Normocephalic and atraumatic.  Mouth/Throat: Oropharynx is clear and moist.  Eyes: EOM are normal. Pupils are  equal, round, and reactive to light.  Neck: Normal range of motion. Neck supple.  Cardiovascular: Normal rate and regular rhythm.  Exam reveals no friction rub.   No murmur heard. Pulmonary/Chest: Effort normal and breath sounds normal. No respiratory distress. She has no wheezes. She has no rales.  Abdominal: Soft. She exhibits no distension. There is no tenderness. There is no rebound.  Musculoskeletal: Normal range of motion. She exhibits no edema.       Lumbar back: She exhibits bony tenderness (mild, midline).  Neurological: She is alert and oriented to person, place, and time.  Skin: She is  not diaphoretic.  Nursing note and vitals reviewed.   ED Course  Procedures (including critical care time) Labs Review Labs Reviewed  CBC WITH DIFFERENTIAL/PLATELET - Abnormal; Notable for the following:    RBC 3.20 (*)    Hemoglobin 9.9 (*)    HCT 29.1 (*)    All other components within normal limits  BASIC METABOLIC PANEL - Abnormal; Notable for the following:    Sodium 131 (*)    Chloride 96 (*)    Glucose, Bld 105 (*)    BUN 27 (*)    Creatinine, Ser 1.49 (*)    GFR calc non Af Amer 38 (*)    GFR calc Af Amer 44 (*)    All other components within normal limits  URINALYSIS, ROUTINE W REFLEX MICROSCOPIC (NOT AT [facility])    Imaging Review Dg Chest 2 View  01/01/2015   CLINICAL DATA:  Presyncope.  Abdominal pain.  Vomiting for 1 week.  EXAM: CHEST  2 VIEW  COMPARISON:  08/26/2007  FINDINGS: The heart size and mediastinal contours are within normal limits. Both lungs are clear. No evidence of pneumothorax or pleural effusion. Pectus excavatum again noted.  IMPRESSION: No active cardiopulmonary disease.  Pectus excavatum.   Electronically Signed   By: Earle Gell M.D.   On: 01/01/2015 16:58   Dg Lumbar Spine Complete  01/01/2015   CLINICAL DATA:  Pain with bilateral radicular symptoms. Fall 2 weeks prior  EXAM: LUMBAR SPINE - COMPLETE 4+ VIEW  COMPARISON:  None.  FINDINGS: Frontal, lateral, spot lumbosacral lateral, and bilateral oblique views were obtained. Patient is status post total hip replacement on the right. There are 5 non rib-bearing lumbar type vertebral bodies. There is no fracture or spondylolisthesis. Disc spaces appear intact. There is facet osteoarthritic change at L5-S1 on the left. There are surgical clips in the right abdomen.  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 No fracture or spondylolisthesis.   Electronically Signed   By: Lowella Grip III M.D.   On: 01/01/2015 15:37     EKG Interpretation None      MDM   Final diagnoses:   Dehydration  Non-intractable vomiting without nausea, vomiting of unspecified type    61F with hx of osteoporosis, developmental delay, chronic pain presents with back pain, decreased PO intake. Was seen in the ED one week ago for flank pain. No stone present on CT, UA shows moderate leukocytes, sent home on Keflex.  Went to PCP today for persistent L flank pain, decreased PO intake. Creatinine last week was 2, which was up from her baseline, so Dr. Buelah Manis at [place] wanted to do IVF in the office but they were unable to. Sent here for further eval. Will xray her back per Dr. Dorian Heckle request as she had some spine tenderness. She has some here for me as well. No  fractures noted on last week's CT. She did have a fall 2 weeks ago where she passed out. Creatinine better here at 1.49. Mild hyponatremia at 131. Will hydrate and xray her back. Urine culture from last week showed multiple species.   Here had some vomiting, happened after pain medicine. Feeling better after Zofran, tolerating PO.  Urine negative here. Will give Zofran to go home. Stable for discharge.      Evelina Bucy, MD 01/01/15 [phone]

## 2015-01-01 NOTE — ED Notes (Addendum)
Sent here from [facility] office for abd pain multiple unsuccessful attempt for iv in office

## 2015-01-11 ENCOUNTER — Other Ambulatory Visit: Payer: Self-pay | Admitting: Family Medicine

## 2015-01-11 NOTE — Telephone Encounter (Signed)
Refill appropriate and filled per protocol. 

## 2015-01-20 ENCOUNTER — Other Ambulatory Visit: Payer: Self-pay | Admitting: Family Medicine

## 2015-01-20 NOTE — Telephone Encounter (Signed)
Refill appropriate and filled per protocol. 

## 2015-03-05 ENCOUNTER — Ambulatory Visit (INDEPENDENT_AMBULATORY_CARE_PROVIDER_SITE_OTHER): Payer: Medicare Other | Admitting: Family Medicine

## 2015-03-05 ENCOUNTER — Ambulatory Visit: Payer: Medicare Other | Admitting: Family Medicine

## 2015-03-05 ENCOUNTER — Encounter: Payer: Self-pay | Admitting: Family Medicine

## 2015-03-05 VITALS — BP 126/62 | HR 62 | Temp 97.5°F | Resp 14 | Ht 64.0 in | Wt 105.0 lb

## 2015-03-05 DIAGNOSIS — E038 Other specified hypothyroidism: Secondary | ICD-10-CM

## 2015-03-05 DIAGNOSIS — Z23 Encounter for immunization: Secondary | ICD-10-CM | POA: Diagnosis not present

## 2015-03-05 DIAGNOSIS — Z0000 Encounter for general adult medical examination without abnormal findings: Secondary | ICD-10-CM | POA: Diagnosis not present

## 2015-03-05 DIAGNOSIS — E785 Hyperlipidemia, unspecified: Secondary | ICD-10-CM

## 2015-03-05 DIAGNOSIS — G8929 Other chronic pain: Secondary | ICD-10-CM | POA: Diagnosis not present

## 2015-03-05 MED ORDER — OXYCODONE HCL 5 MG PO TABS: 2.5000 mg | ORAL_TABLET | Freq: Every evening | ORAL | Status: DC | PRN

## 2015-03-05 MED ORDER — TRAMADOL HCL 50 MG PO TABS: 50.0000 mg | ORAL_TABLET | Freq: Three times a day (TID) | ORAL | Status: DC | PRN

## 2015-03-05 NOTE — Assessment & Plan Note (Signed)
Recheck TFT, weight is improving

## 2015-03-05 NOTE — Assessment & Plan Note (Signed)
Check cholesterol levels, on Tricor

## 2015-03-05 NOTE — Progress Notes (Signed)
Patient ID: Shawna [name], female   DOB: 07/07/1955, 59 y.o.   MRN: [id number] Subjective:   Patient presents for Medicare Annual/Subsequent preventive examination.   Pt here for annual wellness exam. No concerns, here with her sister who is POA  Requested refill on pain medications Now eating and drinking well, sister queries if some of her pain issues were psychosomatic as she tends to complain whenever she leaves to go out of town and [employer] in care of other family members       Review Past Medical/Family/Social: per EMR   Risk Factors  Current exercise habits: walks Dietary issues discussed: None  Cardiac risk factors: Hyperlipidemia  Depression Screen  (Note: if answer to either of the following is "Yes", a more complete depression screening is indicated)  Over the past two weeks, have you felt down, depressed or hopeless? No Over the past two weeks, have you felt little interest or pleasure in doing things? No Have you lost interest or pleasure in daily life? No Do you often feel hopeless? No Do you cry easily over simple problems? No   Activities of Daily Living  In your present state of health, do you have any difficulty performing the following activities?:  Driving? Does not drive  Managing money? No  Feeding yourself? No  Getting from bed to chair? No  Climbing a flight of stairs? No  Preparing food and eating?: No  Bathing or showering? No  Getting dressed: No  Getting to the toilet? No  Using the toilet:No  Moving around from place to place: No  In the past year have you fallen or had a near fall?:No  Are you sexually active? No  Do you have more than one partner? No   Hearing Difficulties: No  Do you often ask people to speak up or repeat themselves? No  Do you experience ringing or noises in your ears? No Do you have difficulty understanding soft or whispered voices? No  Do you feel that you have a problem with memory? No Do you often misplace items? No   Do you feel safe at home? Yes  Cognitive Testing  Alert? Yes Normal Appearance?Yes  Oriented to person? Yes Place? Yes  Time? Yes  Recall of three objects? Yes  Can perform simple calculations? Yes  Displays appropriate judgment?- has MR needs assistance   Can read the correct time from a watch face?Yes   List the Names of Other Physician/Practitioners you currently use:  None  Screening Tests / Date Colonoscopy  - UTD                   Zostavax - age  30 Mammogram -UTD Influenza Vaccine -Due  Tetanus/tdap - unable due to fnances   ROS:  GEN- denies fatigue, fever, weight loss,weakness, recent illness HEENT- denies eye drainage, change in vision, nasal discharge, CVS- denies chest pain, palpitations RESP- denies SOB, cough, wheeze ABD- denies N/V, change in stools, abd pain GU- denies dysuria, hematuria, dribbling, incontinence MSK- denies joint pain, muscle aches, injury Neuro- denies headache, dizziness, syncope, seizure activity  PHYSICAL: GEN- NAD, alert and oriented x3 HEENT- PERRL, EOMI, non injected sclera, pink conjunctiva, MMM, oropharynx clear Neck- Supple, no thryomegaly CVS- RRR, no murmur RESP-CTAB ABD-NABS,soft,NT,ND EXT- No edema Pulses- Radial, DP- 2+   Assessment:    Annual wellness medicare exam   Plan:    During the course of the visit the patient was educated and counseled about appropriate screening and preventive services  including:  Screen NEG  for depression. Flu shot given I aksed her to please have her cardioloist send records since we have none on file.  Diet review for nutrition referral? Yes ____ Not Indicated __x__  Patient Instructions (the written plan) was given to the patient.  Medicare Attestation  I have personally reviewed:  The patient's medical and social history  Their use of alcohol, tobacco or illicit drugs  Their current medications and supplements  The patient's functional ability including ADLs,fall risks, home  safety risks, cognitive, and hearing and visual impairment  Diet and physical activities  Evidence for depression or mood disorders  The patient's weight, height, BMI, and visual acuity have been recorded in the chart. I have made referrals, counseling, and provided education to the patient based on review of the above and I have provided the patient with a written personalized care plan for preventive services.

## 2015-03-05 NOTE — Assessment & Plan Note (Signed)
Uses Ultram and oxy IR only for severe pain, scripts tend to last 6 months or more

## 2015-03-05 NOTE — Patient Instructions (Addendum)
Continue current medications Flu shot given F/U 6 months

## 2015-03-06 LAB — CBC WITH DIFFERENTIAL/PLATELET
BASOS PCT: 0 % (ref 0–1)
Basophils Absolute: 0 10*3/uL (ref 0.0–0.1)
EOS PCT: 1 % (ref 0–5)
Eosinophils Absolute: 0.1 10*3/uL (ref 0.0–0.7)
HCT: 27.2 % — ABNORMAL LOW (ref 36.0–46.0)
Hemoglobin: 9.6 g/dL — ABNORMAL LOW (ref 12.0–15.0)
Lymphocytes Relative: 22 % (ref 12–46)
Lymphs Abs: 1.2 10*3/uL (ref 0.7–4.0)
MCH: 31 pg (ref 26.0–34.0)
MCHC: 35.3 g/dL (ref 30.0–36.0)
MCV: 87.7 fL (ref 78.0–100.0)
MPV: 9.4 fL (ref 8.6–12.4)
Monocytes Absolute: 0.4 10*3/uL (ref 0.1–1.0)
Monocytes Relative: 7 % (ref 3–12)
NEUTROS PCT: 70 % (ref 43–77)
Neutro Abs: 3.9 10*3/uL (ref 1.7–7.7)
PLATELETS: 211 10*3/uL (ref 150–400)
RBC: 3.1 MIL/uL — AB (ref 3.87–5.11)
RDW: 13.5 % (ref 11.5–15.5)
WBC: 5.5 10*3/uL (ref 4.0–10.5)

## 2015-03-06 LAB — COMPREHENSIVE METABOLIC PANEL
ALK PHOS: 64 U/L (ref 33–130)
ALT: 15 U/L (ref 6–29)
AST: 28 U/L (ref 10–35)
Albumin: 4.3 g/dL (ref 3.6–5.1)
BUN: 29 mg/dL — AB (ref 7–25)
CO2: 21 mmol/L (ref 20–31)
CREATININE: 1.4 mg/dL — AB (ref 0.50–1.05)
Calcium: 9.5 mg/dL (ref 8.6–10.4)
Chloride: 103 mmol/L (ref 98–110)
GLUCOSE: 78 mg/dL (ref 70–99)
POTASSIUM: 4.9 mmol/L (ref 3.5–5.3)
SODIUM: 137 mmol/L (ref 135–146)
TOTAL PROTEIN: 6.8 g/dL (ref 6.1–8.1)
Total Bilirubin: 0.6 mg/dL (ref 0.2–1.2)

## 2015-03-06 LAB — LIPID PANEL
Cholesterol: 166 mg/dL (ref 125–200)
HDL: 58 mg/dL (ref >=46)
LDL Cholesterol: 93 mg/dL (ref <130)
Total CHOL/HDL Ratio: 2.9 Ratio (ref <=5.0)
Triglycerides: 77 mg/dL (ref <150)
VLDL: 15 mg/dL (ref <30)

## 2015-03-06 LAB — T4, FREE: FREE T4: 1.54 ng/dL (ref 0.80–1.80)

## 2015-03-06 LAB — T3, FREE: T3 FREE: 3.2 pg/mL (ref 2.3–4.2)

## 2015-03-06 LAB — TSH: TSH: 0.05 u[IU]/mL — ABNORMAL LOW (ref 0.350–4.500)

## 2015-03-09 ENCOUNTER — Other Ambulatory Visit: Payer: Self-pay | Admitting: *Deleted

## 2015-03-09 MED ORDER — LEVOTHYROXINE SODIUM 50 MCG PO TABS: 50.0000 ug | ORAL_TABLET | Freq: Every day | ORAL | Status: DC

## 2015-04-26 ENCOUNTER — Other Ambulatory Visit: Payer: Self-pay | Admitting: Family Medicine

## 2015-04-26 NOTE — Telephone Encounter (Signed)
Refill appropriate and filled per protocol. 

## 2015-05-28 ENCOUNTER — Other Ambulatory Visit: Payer: Self-pay | Admitting: Family Medicine

## 2015-05-28 NOTE — Telephone Encounter (Signed)
Refill appropriate and filled per protocol. 

## 2015-06-09 ENCOUNTER — Other Ambulatory Visit: Payer: Self-pay | Admitting: Family Medicine

## 2015-06-10 NOTE — Telephone Encounter (Signed)
Ok to refill??  Last office visit/ refill 03/05/2015.

## 2015-06-11 NOTE — Telephone Encounter (Signed)
okay

## 2015-06-11 NOTE — Telephone Encounter (Signed)
Medication called to pharmacy. 

## 2015-06-14 ENCOUNTER — Ambulatory Visit (INDEPENDENT_AMBULATORY_CARE_PROVIDER_SITE_OTHER): Payer: Medicare Other | Admitting: Internal Medicine

## 2015-06-14 ENCOUNTER — Ambulatory Visit (INDEPENDENT_AMBULATORY_CARE_PROVIDER_SITE_OTHER): Payer: Medicare Other

## 2015-06-14 VITALS — BP 106/72 | HR 73 | Temp 98.2°F | Resp 16

## 2015-06-14 DIAGNOSIS — M25561 Pain in right knee: Secondary | ICD-10-CM | POA: Diagnosis not present

## 2015-06-14 DIAGNOSIS — S83004A Unspecified dislocation of right patella, initial encounter: Secondary | ICD-10-CM

## 2015-06-14 DIAGNOSIS — T1490XA Injury, unspecified, initial encounter: Principal | ICD-10-CM

## 2015-06-14 DIAGNOSIS — D649 Anemia, unspecified: Secondary | ICD-10-CM | POA: Diagnosis not present

## 2015-06-14 MED ORDER — HYDROCODONE-ACETAMINOPHEN 5-325 MG PO TABS: 1.0000 | ORAL_TABLET | Freq: Four times a day (QID) | ORAL | Status: DC | PRN

## 2015-06-14 NOTE — Progress Notes (Signed)
Subjective:  By signing my name below, I, Shawna [name], attest that this documentation has been prepared under the direction and in the presence of Shawna Lin, MD.  Electronically Signed: Thea [name], ED Scribe. 06/14/2015. 6:35 PM.   Patient ID: Shawna [name], female    DOB: Mar 16, 1956, 59 y.o.   MRN: [id number]  HPI 1st [facility] OV  Chief Complaint  Patient presents with  . Knee Injury    Right-Got knocked down by dog today   HPI Comments: Shawna [name] is a 59 y.o. female who presents to the [facility] complaining of right knee injury that occurred a couple hours ago. Pt states she was knocked down by a dog and landed on her back. She is unsure the position of right knee during fall. She is now unable to bear weight or walk on right leg. Cannot straighten R knee. She reports some pain to right lower leg and above to right upper leg. Just hurts to move. Not c/o hip or back pain, or other injury site Unsure past hx injury. Here w sis caretaker--mental retardation so she is guardian.   pcp Dr Shawna [name] Patient Active Problem List   Diagnosis Date Noted  . Anemia 06/14/2015  . Loss of weight 08/26/2013  . Hyperlipidemia 02/08/2012  . Osteoporosis 08/10/2011  . Chronic pain 01/27/2011  . Mental retardation 01/27/2011  . Hypothyroid 01/26/2011   Past Medical History  Diagnosis Date  . Thyroid disease   . Chronic pain   . Osteoporosis     s/p forteo  . Iron deficiency anemia   . Chronic kidney disease   . Arthritis   . Hyperlipidemia    Past Surgical History  Procedure Laterality Date  . Cholecystectomy    . Joint replacement      right hip x 3 surgeries   Allergies  Allergen Reactions  . Bisphosphonates     Irritation of abdominal lining and oropharynx   Prior to Admission medications   Medication Sig Start Date End Date Taking? Authorizing Provider  aspirin 81 MG tablet Take 81 mg by mouth daily.     Yes Historical Provider, MD  calcium gluconate 500  MG tablet Take 500 mg by mouth 2 (two) times daily.     Yes Historical Provider, MD  fenofibrate (TRICOR) 48 MG tablet TAKE ONE (1) TABLET EACH DAY FOR CHOLESTEROL 05/28/15  Yes Shawna Rossetti, MD  levothyroxine (SYNTHROID, LEVOTHROID) 50 MCG tablet TAKE ONE (1) TABLET BY MOUTH EVERY DAY BEFORE BREAKFAST 04/26/15  Yes Shawna Rossetti, MD  Multiple Vitamins-Minerals (CENTRUM SILVER) CHEW Chew 1 tablet by mouth daily.     Yes Historical Provider, MD  ondansetron (ZOFRAN ODT) 4 MG disintegrating tablet 4mg  ODT q4 hours prn nausea/vomit 01/01/15  Yes Shawna Bucy, MD  oxyCODONE (OXY IR/ROXICODONE) 5 MG immediate release tablet Take 0.5 tablets (2.5 mg total) by mouth at bedtime as needed. 03/05/15  Yes Shawna Rossetti, MD  traMADol (ULTRAM) 50 MG tablet TAKE ONE TABLET EVERY 8 HOURS AS NEEDED FOR PAIN 06/11/15  Yes Shawna Rossetti, MD   Social History   Social History  . Marital Status: Single    Spouse Name: N/A  . Number of Children: N/A  . Years of Education: N/A   Occupational History  . Not on file.   Social History Main Topics  . Smoking status: Never Smoker   . Smokeless tobacco: Never Used  . Alcohol Use: No  . Drug Use: No  .  Sexual Activity: Not Currently   Other Topics Concern  . Not on file   Social History Narrative   Review of Systems  Musculoskeletal: Positive for arthralgias and gait problem.  Neurological: Negative for weakness and numbness.  no other medical complaints Objective:   Physical Exam  Constitutional: She appears well-developed and well-nourished.  Obviously in pain. In wheelchair.  HENT:  Head: Normocephalic and atraumatic.  Eyes: Conjunctivae and EOM are normal. Pupils are equal, round, and reactive to light.  Neck: Normal range of motion. Neck supple.  Cardiovascular: Normal rate and intact distal pulses.   Pulmonary/Chest: Effort normal.  Musculoskeletal:  Right knee is fixed in 90 flexion and is unable to move. I can't move it due to pain.  The normal landmarks are not present. Appears patella dislocated laterally. No joint effusion. Tender joint lines and distal femur areas. R hip non tender w/ good rom--scar from prior replacement No contusions or abrasions around knee  Neurological: She is alert. No cranial nerve deficit.  Skin: Skin is warm and dry.  Nursing note and vitals reviewed. BP 106/72 mmHg  Pulse 73  Temp(Src) 98.2 F (36.8 C) (Oral)  Resp 16  Ht   Wt   SpO2 98%  Filed Vitals:   06/14/15 1808  BP: 106/72  Pulse: 73  Temp: 98.2 F (36.8 C)  TempSrc: Oral  Resp: 16  SpO2: 98%   [facility] reading (PRIMARY) by Dr. Laney [name]. Right knee- She cannot straighten leg so xrays compromised. Suspect patellar dislocation laterally. No Fx seen. Post reduction lateral view does no show evidence of bone fracture tho can't be completely sure --hip hardwear in view///She will be rerayed at ortho followup  Procedure: with sterile field injected knee with 10cc 2%lidocaine and with adequate reduction of discomfort was able to slowly extend knee to 180 and then with pressure relocated patella to midline. Placed in hinged knee brace with open pat space     Assessment & Plan:   1. Pain of right knee after injury   2. Patellar dislocation, right, initial encounter    Avoid weight bearing until ortho f/u   Meds ordered this encounter  Medications  . HYDROcodone-acetaminophen (NORCO/VICODIN) 5-325 MG tablet    Sig: Take 1 tablet by mouth every 6 (six) hours as needed for moderate pain.    Dispense:  20 tablet    Refill:  0    I have completed the patient encounter in its entirety as documented by the scribe, with editing by me where necessary. Shawna [name], M.D.

## 2015-06-16 DIAGNOSIS — S83004A Unspecified dislocation of right patella, initial encounter: Secondary | ICD-10-CM | POA: Diagnosis not present

## 2015-06-18 DIAGNOSIS — S83004D Unspecified dislocation of right patella, subsequent encounter: Secondary | ICD-10-CM | POA: Diagnosis not present

## 2015-06-23 ENCOUNTER — Other Ambulatory Visit: Payer: Self-pay | Admitting: Orthopaedic Surgery

## 2015-06-23 DIAGNOSIS — M25561 Pain in right knee: Secondary | ICD-10-CM

## 2015-07-06 ENCOUNTER — Ambulatory Visit
Admission: RE | Admit: 2015-07-06 | Discharge: 2015-07-06 | Disposition: A | Discharge status: Home / Self Care | Payer: Medicare Other | Source: Ambulatory Visit | Attending: Orthopaedic Surgery | Admitting: Orthopaedic Surgery

## 2015-07-06 DIAGNOSIS — S83004A Unspecified dislocation of right patella, initial encounter: Secondary | ICD-10-CM | POA: Diagnosis not present

## 2015-07-06 DIAGNOSIS — M25561 Pain in right knee: Secondary | ICD-10-CM

## 2015-07-09 DIAGNOSIS — S83004S Unspecified dislocation of right patella, sequela: Secondary | ICD-10-CM | POA: Diagnosis not present

## 2015-07-15 ENCOUNTER — Ambulatory Visit: Payer: Medicare Other | Attending: Orthopaedic Surgery | Admitting: Physical Therapy

## 2015-07-15 DIAGNOSIS — R6889 Other general symptoms and signs: Secondary | ICD-10-CM

## 2015-07-15 DIAGNOSIS — R29898 Other symptoms and signs involving the musculoskeletal system: Secondary | ICD-10-CM | POA: Insufficient documentation

## 2015-07-15 DIAGNOSIS — R262 Difficulty in walking, not elsewhere classified: Secondary | ICD-10-CM | POA: Insufficient documentation

## 2015-07-15 DIAGNOSIS — M25661 Stiffness of right knee, not elsewhere classified: Secondary | ICD-10-CM

## 2015-07-15 NOTE — Therapy (Signed)
[place], [place], [address] Phone: [phone]   Fax:  [phone]  Physical Therapy Evaluation  Patient Details  Name: Shawna [name] MRN: [id number] Date of Birth: 10-08-1955 Referring Provider: Joni Fears MD  Encounter Date: 07/15/2015      PT End of Session - 07/15/15 1539    Visit Number 1   Number of Visits 12   Date for PT Re-Evaluation 08/26/15   Authorization Type ACO Registry   PT Start Time 0845   PT Stop Time 0929   PT Time Calculation (min) 44 min   Activity Tolerance Patient tolerated treatment well   Behavior During Therapy [facility] for tasks assessed/performed      Past Medical History  Diagnosis Date  . Thyroid disease   . Chronic pain   . Osteoporosis     s/p forteo  . Iron deficiency anemia   . Chronic kidney disease   . Arthritis   . Hyperlipidemia     Past Surgical History  Procedure Laterality Date  . Cholecystectomy    . Joint replacement      right hip x 3 surgeries    There were no vitals filed for this visit.  Visit Diagnosis:  Decreased ROM of right knee  Difficulty walking  Decreased strength involving knee joint  Activity intolerance      Subjective Assessment - 07/15/15 0850    Subjective Pt sister speaks for pt due to mental retardation. lPt is fearful of knee subluxing out of place.  Pt steps up and down stairs one at a time. 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 unlimited   How long can you stand comfortably? unlimited   How long can you walk comfortably? unlimited   Patient Stated Goals Pt wont bed due to fear of  patella being out of place.  wants to be able to get knee stronger   Currently in Pain? No/denies   Pain Score 0-No pain  when subluxed 9/10   Pain Location Knee   Pain Orientation Right   Pain Descriptors / Indicators Spasm   Pain Onset More than a month ago   Pain Frequency Occasional   Aggravating Factors  steps            [facility] PT Assessment - 07/15/15 0856    Assessment   Medical Diagnosis Right subluxing patella   Referring Provider Joni Fears MD   Onset Date/Surgical Date 06/13/15   Hand Dominance Right   Prior Therapy none   Precautions   Precautions None   Required Braces or Orthoses Other Brace/Splint   Other Brace/Splint patellar subluxtion brace   Restrictions   Weight Bearing Restrictions No   Balance Screen   Has the patient fallen in the past 6 months Yes   How many times? 1  tripped over dog   Has the patient had a decrease in activity level because of a fear of falling?  No  fear of knee subluxing but continues normal activity   Is the patient reluctant to leave their home because of a fear of falling?  No   Prior Function   Level of Independence Independent with community mobility without device;Independent with  basic ADLs   Vocation On disability   Cognition   Overall Cognitive Status History of cognitive impairments - at baseline  Mental retardation   Observation/Other Assessments   Focus on Therapeutic Outcomes (FOTO)   Intake 47%, limtation 53%, predicted 38%   Posture/Postural Control   Postural Limitations Anterior pelvic tilt  Right pelvis level higher than left   Posture Comments Right LE leg length discrepancy Right leg short to shorter with LOng sitting test   AROM   Right Hip Flexion 90   Left Hip Flexion 118   Right Knee Extension 3   Right Knee Flexion 73  pain limiting   Left Knee Extension -3   Left Knee Flexion 142   Strength   Overall Strength Unable to assess   Overall Strength Comments Pt able to walk into clinic with knee immobilizer . Pt apprehensive about bending  right knee     Palpation   Patella mobility Hypermobility of Right patella laterally but decreased mobility inferiorly. Pt has no pain with palpation   Palpation comment Pt refuses to bend knee for fear of dislocation and has limited flexion.  Pt with  pain   Apparent   Right 83.5 in.  cm   Left 86 in.   Comments Pt with right THR greater than 10 years ago   Ambulation/Gait   Ambulation/Gait Yes   Ambulation/Gait Assistance 6: Modified independent (Device/Increase time)   Ambulation Distance (Feet) 100 Feet   Assistive device None   Gait Pattern Decreased hip/knee flexion - right;Decreased step length - left;Decreased stance time - right   Gait velocity 2.02 ft/sec        Therapeutic Exericise  SAQ 1 x 10 Quad sets 20 x with towel roll under knee right knee LAQ 1 x 10  May use red T band for strengthening  SLR 1 x 10 on right Heel slide to tolerance 1 x 10  Heat therapy before exericise and cold after exercise for pain control                       PT Education - 07/15/15 0917    Education provided Yes   Education Details POC, Explanation of findings and leg length discrepancy.  use of heat and Level 1 knee exercises   Person(s) Educated Patient;Caregiver(s)  sister Shawna [name]   Methods Explanation;Demonstration;Verbal cues;Tactile cues;Handout   Comprehension Verbalized understanding;Returned demonstration;Need further instruction;Tactile cues required;Verbal cues required          PT Short Term Goals - 07/15/15 0929    PT SHORT TERM GOAL #1   Title "Independent with initial HEP   Time 3   Period Weeks   Status New   PT SHORT TERM GOAL #2   Title "Report pain decrease when bending knee from   9/10 to  5 /10.   Time 3   Period Weeks   Status New   PT SHORT TERM GOAL #3   Title "Demonstrate and verbalize understanding of condition management including RICE, positioning, use of A.D., HEP.    Time 3   Period Weeks   Status New           PT Long Term  Goals - 07/15/15 0929    PT LONG TERM GOAL #1   Title "Pt will be independent with advanced HEP.    Time 6   Period Weeks   Status New   PT LONG TERM GOAL #2   Title "Pain will decrease  to 2/10 when bending knee with all functional activities   Time 6   Period Weeks   Status New   PT LONG TERM GOAL #3   Title "Pt will tolerate standing and walking for 2 hours without increased pain in order to return to PLOF doing laundry and walking dogs   Time 6   Period Weeks   Status New   PT LONG TERM GOAL #4   Title "FOTO will improve from 53% limitation   to   38%  indicating improved functional mobility    Time 6   Period Weeks   Status New   PT LONG TERM GOAL #5   Title Pt will be able to bend knee to at least 110 flexion to improve ability to negotiate steps and no longer using immobilizer   Time 6   Period Weeks   Status New               Plan - 07/28/15 [id number]    Clinical Impression Statement 60 yo MR female with > 10 year R THR and leg length discrepancy at least 2.5 cm difference presents with inablity to bend right knee past 72 degrees due to fear of dislocating patella. Pt has difficulty walking with antalgic gait .  Pt has recurrent subluxing patella as diagnosed by Dr. Durward Fortes.. Pt is dependent on knee immobilizer and will benefit from reducing wear to increase flexion.  Pt given HEP for quad strengthening. Pt may benefit from shoe lift or inserts to decrease apparent leg length discrepancy. Pt has no pain at rest but complains fo 9/10 with bending knee.   Pt would benefit from skilled PT for 2 times a week for 6 weeks to address above impariments and functional limitations and return to pain-free PLOF   Pt will benefit from skilled therapeutic intervention in order to improve on the following deficits Abnormal gait;Decreased activity tolerance;Postural dysfunction;Pain;Decreased mobility;Decreased strength;Decreased range of motion;Difficulty walking   Rehab Potential Good    PT Frequency 2x / week   PT Duration 6 weeks   PT Next Visit Plan Progress knee flexion as pt allows.  Strengthen quads with t band red. also check shoe lift to decrease leg length disparity.   PT Home Exercise Plan level one HEP for knee strenght   Consulted and Agree with Plan of Care Patient;Family member/caregiver          G-Codes - 07-28-15 1552    Functional Assessment Tool Used FOTO limtation 53%   Functional Limitation Mobility: Walking and moving around  53% limtation   Mobility: Walking and Moving Around Current Status [phone]) At least 40 percent but less than 60 percent impaired, limited or restricted   Mobility: Walking and Moving Around Goal Status [phone]) At least 20 percent but less than 40 percent impaired, limited or restricted  38       Problem List Patient Active Problem List   Diagnosis Date Noted  . Anemia 06/14/2015  . Loss of weight 08/26/2013  . Hyperlipidemia 02/08/2012  . Osteoporosis 08/10/2011  . Chronic pain 01/27/2011  . Mental retardation 01/27/2011  . Hypothyroid 01/26/2011   Voncille Lo, PT July 28, 2015 4:09 PM Phone: [phone] Fax: [facility] [address] [place], [place], [address] Phone: [phone]   Fax:  [phone]  Name: Shawna [name] MRN: [id number] Date of Birth: 31-Dec-1955

## 2015-07-15 NOTE — Patient Instructions (Signed)
Copyright  VHI. All rights reserved.  HIP: Flexion / KNEE: Extension, Straight Leg Raise   Raise leg, keeping knee straight. Perform slowly. _10__ reps per set, _3__ sets per 1-2 times day, __7_ days per week   Copyright  VHI. All rights reserved.  Heel Slide   Bend knee and pull heel toward buttocks. Hold _3-5___ seconds. Return. Repeat with other knee. Repeat _10 x 3___ times. Do __1-2__ sessions per day.  http://[email or web address].exer.us/372   Copyright  VHI. All rights reserved.     Raise leg until knee is straight. _10__ reps per set, _3__ sets per day, _1-2__ days per week  Copyright  VHI. All rights reserved.  [employer] a large can or rolled towel under leg. Straighten knee and leg. Hold _5 ___ seconds. Repeat with other leg. Repeat ___3 x 10_ times. Do __1-2__ sessions per day.  http://[email or web address].exer.us/365   Copyright  VHI. All rights reserved.  Quad Set   Slowly tighten muscles on thigh of straight leg while counting out loud to _5___. On right knee Repeat _100___ times. Do _several.___ sessions per day.  Every time you watch TV  Do 100 quad sets wants [place] to do  Heat Therapy Heat therapy can help make painful, stiff muscles and joints feel better. Do not use heat on new injuries. Wait at least 48 hours after an injury to use heat. Do not use heat when you have aches or pains right after an activity. If you still have pain 3 hours after stopping the activity, then you may use heat. HOME CARE Wet heat pack  Soak a clean towel in warm water. Squeeze out the extra water.  Put the warm, wet towel in a plastic bag.  Place a thin, dry towel between your skin and the bag.  Put the heat pack on the area for 5 minutes, and check your skin. Your skin may be pink, but it should not be red.  Leave the heat pack on the area for 15 to 30 minutes.  Repeat this every 2 to 4 hours while awake. Do not use heat while you are sleeping. Warm water bath  Fill a tub  with warm water.  Place the affected body part in the tub.  Soak the area for 20 to 40 minutes.  Repeat as needed. Hot water bottle  Fill the water bottle half full with hot water.  Press out the extra air. Close the cap tightly.  Place a dry towel between your skin and the bottle.heat  Put the bottle on the area for 5 minutes, and check your skin. Your skin may be pink, but it should not be red.  Leave the bottle on the area for 15 to 30 minutes.  Repeat this every 2 to 4 hours while awake. Electric heating pad  Place a dry towel between your skin and the heating pad.  Set the heating pad on low heat.  Put the heating pad on the area for 10 minutes, and check your skin. Your skin may be pink, but it should not be red.  Leave the heating pad on the area for 20 to 40 minutes.  Repeat this every 2 to 4 hours while awake.  Do not lie on the heating pad.  Do not fall asleep while using the heating pad.  Do not use the heating pad near water. GET HELP RIGHT AWAY IF:  You get blisters or red skin.  Your skin is puffy (swollen), or  you lose feeling (numbness) in the affected area.  You have any new problems.  Your problems are getting worse.  You have any questions or concerns. If you have any problems, stop using heat therapy until you see your doctor. MAKE SURE YOU:  Understand these instructions.  Will watch your condition.  Will get help right away if you are not doing well or get worse. Document Released: 09/04/2011 Document Reviewed: 08/05/2013 [facility] Patient Information 2015 [place]. This information is not intended to replace advice given to you by your health care provider. Make sure you discuss any questions you have with your health care provider.   http://[email or web address].exer.us/361   Copyright  VHI. All rights reserved.  Voncille Lo, PT 07/15/2015 9:14 AM Phone: [phone] Fax: [phone]

## 2015-07-19 ENCOUNTER — Ambulatory Visit: Payer: Medicare Other | Admitting: Physical Therapy

## 2015-07-19 DIAGNOSIS — R29898 Other symptoms and signs involving the musculoskeletal system: Secondary | ICD-10-CM

## 2015-07-19 DIAGNOSIS — R6889 Other general symptoms and signs: Secondary | ICD-10-CM | POA: Diagnosis not present

## 2015-07-19 DIAGNOSIS — R262 Difficulty in walking, not elsewhere classified: Secondary | ICD-10-CM | POA: Diagnosis not present

## 2015-07-19 DIAGNOSIS — M25661 Stiffness of right knee, not elsewhere classified: Secondary | ICD-10-CM

## 2015-07-19 NOTE — Patient Instructions (Addendum)
   Hip Flexor Stretch   Lying on back near edge of bed, bend one leg, foot flat. Hang other leg over edge, relaxed, thigh resting entirely on bed for 3____ minutes. Repeat ___1_ times. Do _1-2___ sessions per day. Advanced Exercise: Bend knee back keeping thigh in contact with bed.  Knee Extension: Resisted (Sitting)   With band looped around right ankle and under other foot, straighten leg with ankle loop. Keep other leg bent to increase resistance. Repeat _10___ times per set. Do _3___ sets per session. Do _1-2___ sessions per day.  http://orth.[email or web address].us/690   CKnee Flexion: Resisted (Sitting)   Sit with band under left foot and looped around ankle of supported leg. Pull unsupported leg back. Repeat 10____ times per set. Do _3___ sets per session. Do __1-2__ sessions per day.  http://orth.[email or web address].us/695   Copyright  VHI. All rights reserved.              [email or web address], PT 07/19/2015 4:14 PM Phone: [phone] Fax: [phone]

## 2015-07-19 NOTE — Therapy (Signed)
[place], [place], [address] Phone: [phone]   Fax:  [phone]  Physical Therapy Treatment  Patient Details  Name: Shawna [name] MRN: [id number] Date of Birth: Jan 20, 1956 Referring Provider: Joni Fears MD  Encounter Date: 07/19/2015      PT End of Session - 07/19/15 1545    Visit Number 2   Number of Visits 12   Date for PT Re-Evaluation 08/26/15   Authorization Type ACO Registry   PT Start Time 0345   PT Stop Time 0440   PT Time Calculation (min) 55 min   Activity Tolerance Patient limited by pain   Behavior During Therapy Anxious      Past Medical History  Diagnosis Date  . Thyroid disease   . Chronic pain   . Osteoporosis     s/p forteo  . Iron deficiency anemia   . Chronic kidney disease   . Arthritis   . Hyperlipidemia     Past Surgical History  Procedure Laterality Date  . Cholecystectomy    . Joint replacement      right hip x 3 surgeries    There were no vitals filed for this visit.  Visit Diagnosis:  Decreased ROM of right knee  Difficulty walking  Decreased strength involving knee joint  Activity intolerance      Subjective Assessment - 07/19/15 1547    Subjective Pt came back without sister. 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  wants to be able to get knee stronger   Currently in Pain? Yes   Pain Score 2    Pain Location Knee  right on top of knee            [facility] PT Assessment - 07/19/15 1549    AROM   Right Knee Flexion 84                     [facility] Adult PT Treatment/Exercise - 07/19/15 1549    Ambulation/Gait   Ambulation/Gait Yes   Ambulation/Gait Assistance 7: Independent   Ambulation Distance (Feet) 100 Feet   Assistive device None   Gait Pattern  Decreased hip/knee flexion - right;Decreased step length - left;Decreased stance time - right   Gait Comments Pt added heel lift in right shoe for apparent leg length discrepancy    Knee/Hip Exercises: Stretches   Hip Flexor Stretch 3 reps;30 seconds   Hip Flexor Stretch Limitations difficulty bendiing right knee with hip flexor stretch with tightened rectus femoris   Knee/Hip Exercises: Standing   Knee Flexion Strengthening;1 set;Right   Knee Flexion Limitations knee 45 degrees holding onto counter.  apprehension about bending   Knee/Hip Exercises: Seated   Other Seated Knee/Hip Exercises Red Theraband EXtension/flexion on Right with red theraband 2 x 10 each with VC   Knee/Hip Exercises: Supine   Straight Leg Raises Right;3 sets;10 reps   Straight Leg Raises Limitations constant verbal cuing.   Patellar Mobs Pt with patellar mobs in inferior mobs   Other Supine Knee/Hip Exercises right hip PROM in all planes,  Pt with poor right hip dissociation and limited flexion to 90 degrees.     Modalities   Modalities Moist Heat   Moist Heat Therapy   Number Minutes Moist Heat 10 Minutes  Moist Heat Location Knee  pre manual therapy and added wt for SLR   Manual Therapy   Soft tissue mobilization peri patellar musculature and IASTYM to Right quad    McConnell Right knee lateral to medical for subluxating patella  pt with pain with end range bend                PT Education - 07/19/15 1625    Education provided Yes   Education Details added to HEP.  gave heel lift to right leg to adjust leg lengthe and Mcconnell talping of Right knee subluxing    Person(s) Educated Patient   Methods Explanation;Demonstration;Tactile cues;Verbal cues;Handout   Comprehension Verbalized understanding;Returned demonstration          PT Short Term Goals - 07/19/15 1546    PT SHORT TERM GOAL #1   Title "Independent with initial HEP   Time 3   Period Weeks   Status On-going   PT SHORT TERM GOAL  #2   Title "Report pain decrease when bending knee from   9/10 to  5 /10.   Time 3   Period Weeks   Status On-going   PT SHORT TERM GOAL #3   Title "Demonstrate and verbalize understanding of condition management including RICE, positioning, use of A.D., HEP.    Time 3   Period Weeks   Status On-going           PT Long Term Goals - 07/19/15 1546    PT LONG TERM GOAL #1   Title "Pt will be independent with advanced HEP.    Time 6   Period Weeks   Status On-going   PT LONG TERM GOAL #2   Title "Pain will decrease to 2/10 when bending knee with all functional activities   Time 6   Period Weeks   Status On-going   PT LONG TERM GOAL #3   Title "Pt will tolerate standing and walking for 2 hours without increased pain in order to return to PLOF doing laundry and walking dogs   Time 6   Period Weeks   Status On-going   PT LONG TERM GOAL #4   Title "FOTO will improve from 53% limitation   to   38%  indicating improved functional mobility    Time 6   Period Weeks   Status On-going   PT LONG TERM GOAL #5   Title Pt will be able to bend knee to at least 110 flexion to improve ability to negotiate steps and no longer using immobilizer   Time 6   Period Weeks   Status On-going               Plan - 07/19/15 1743    Clinical Impression Statement Shawna [name] returns to clinic and tries heel lift for right apparent leg length discrepancy to minimize pull and subluxation of right knee.  Will assess heel lift next visit for benefit.  Pt able to bend right knee post manual , exericise and moist heat to 85 degrees.   Pt .  was able to participate with exericise more easily when [employer] tape administered for suppport.  Will assess benefit  and educate patient  as she progresses with exericise.   PT Home Exercise Plan continue HEP and added hip flexor stretch and theraband red for knee ext/flex in sitting        Problem List Patient Active Problem List   Diagnosis Date Noted  .  Anemia 06/14/2015  .  Loss of weight 08/26/2013  . Hyperlipidemia 02/08/2012  . Osteoporosis 08/10/2011  . Chronic pain 01/27/2011  . Mental retardation 01/27/2011  . Hypothyroid 01/26/2011    Voncille Lo, PT 07/19/2015 5:48 PM Phone: [phone] Fax: [facility] [address] [place], [place], [address] Phone: [phone]   Fax:  [phone]  Name: Shawna [name] MRN: [id number] Date of Birth: 1956/01/26

## 2015-07-21 ENCOUNTER — Ambulatory Visit: Payer: Medicare Other | Admitting: Physical Therapy

## 2015-07-21 DIAGNOSIS — R262 Difficulty in walking, not elsewhere classified: Secondary | ICD-10-CM | POA: Diagnosis not present

## 2015-07-21 DIAGNOSIS — R29898 Other symptoms and signs involving the musculoskeletal system: Secondary | ICD-10-CM | POA: Diagnosis not present

## 2015-07-21 DIAGNOSIS — M25661 Stiffness of right knee, not elsewhere classified: Secondary | ICD-10-CM

## 2015-07-21 DIAGNOSIS — R6889 Other general symptoms and signs: Secondary | ICD-10-CM | POA: Diagnosis not present

## 2015-07-21 NOTE — Therapy (Signed)
[place], [place], [address] Phone: [phone]   Fax:  [phone]  Physical Therapy Treatment  Patient Details  Name: Shawna [name] MRN: [id number] Date of Birth: 1955/06/30 Referring Provider: Joni Fears MD  Encounter Date: 07/21/2015      PT End of Session - 07/21/15 0851    Visit Number 3   Number of Visits 12   Date for PT Re-Evaluation 08/26/15   PT Start Time 0845   PT Stop Time 0940   PT Time Calculation (min) 55 min      Past Medical History  Diagnosis Date  . Thyroid disease   . Chronic pain   . Osteoporosis     s/p forteo  . Iron deficiency anemia   . Chronic kidney disease   . Arthritis   . Hyperlipidemia     Past Surgical History  Procedure Laterality Date  . Cholecystectomy    . Joint replacement      right hip x 3 surgeries    There were no vitals filed for this visit.  Visit Diagnosis:  Decreased ROM of right knee  Difficulty walking  Decreased strength involving knee joint  Activity intolerance      Subjective Assessment - 07/21/15 0850    Subjective My knee is better. It does not hurt as much   Currently in Pain? No/denies            [facility] PT Assessment - 07/21/15 0001    ROM / Strength   AROM / PROM / Strength Strength                     [facility] Adult PT Treatment/Exercise - 07/21/15 0001    Knee/Hip Exercises: Stretches   Hip Flexor Stretch 3 reps;30 seconds   Knee/Hip Exercises: Aerobic   Nustep L1 UE/LE x 5 minutes for ROM and strength   Knee/Hip Exercises: Machines for Strengthening   Other Machine Pilates Reformer- foot work bilateral with 2 red on heel and toes with manual guidance assisting to maintain DF and PF in parallel. Reduced to 1 yellow to try right    Knee/Hip Exercises: Seated   Other Seated Knee/Hip Exercises Red Theraband EXtension/flexion on Right with red theraband 2 x 10 each with VC   Knee/Hip Exercises:  Supine   Straight Leg Raises Right;3 sets;10 reps   Patellar Mobs Pt with patellar mobs in inferior mobs   Moist Heat Therapy   Number Minutes Moist Heat 15 Minutes   Moist Heat Location Knee  end of treatment                  PT Short Term Goals - 07/19/15 1546    PT SHORT TERM GOAL #1   Title "Independent with initial HEP   Time 3   Period Weeks   Status On-going   PT SHORT TERM GOAL #2   Title "Report pain decrease when bending knee from   9/10 to  5 /10.   Time 3   Period Weeks   Status On-going   PT SHORT TERM GOAL #3   Title "Demonstrate and verbalize understanding of condition management including RICE, positioning, use of A.D., HEP.    Time 3   Period Weeks   Status On-going           PT Long Term Goals - 07/19/15 1546    PT LONG TERM GOAL #1   Title "Pt will be independent with advanced  HEP.    Time 6   Period Weeks   Status On-going   PT LONG TERM GOAL #2   Title "Pain will decrease to 2/10 when bending knee with all functional activities   Time 6   Period Weeks   Status On-going   PT LONG TERM GOAL #3   Title "Pt will tolerate standing and walking for 2 hours without increased pain in order to return to PLOF doing laundry and walking dogs   Time 6   Period Weeks   Status On-going   PT LONG TERM GOAL #4   Title "FOTO will improve from 53% limitation   to   38%  indicating improved functional mobility    Time 6   Period Weeks   Status On-going   PT LONG TERM GOAL #5   Title Pt will be able to bend knee to at least 110 flexion to improve ability to negotiate steps and no longer using immobilizer   Time 6   Period Weeks   Status On-going               Plan - 07/21/15 0929    Clinical Impression Statement Abella reports decreased pain since last visit. She is still wearing McConnel's tape. Trial of Nustep and foot work on Visual merchandiser for ROM. Visually 85 degrees of flexion.    PT Next Visit Plan Progress knee flexion as pt allows.   Strengthen quads with t band red. also check shoe lift to decrease leg length disparity.- check right ankle- add calf stretch and DF strengthening        Problem List Patient Active Problem List   Diagnosis Date Noted  . Anemia 06/14/2015  . Loss of weight 08/26/2013  . Hyperlipidemia 02/08/2012  . Osteoporosis 08/10/2011  . Chronic pain 01/27/2011  . Mental retardation 01/27/2011  . Hypothyroid 01/26/2011    Dorene Ar, PTA 07/21/2015, 9:38 AM  [place], [place], [address] Phone: [phone]   Fax:  [phone]  Name: Shawna [name] MRN: [id number] Date of Birth: 1955/11/20

## 2015-07-26 ENCOUNTER — Ambulatory Visit: Payer: Medicare Other | Admitting: Physical Therapy

## 2015-07-26 DIAGNOSIS — R262 Difficulty in walking, not elsewhere classified: Secondary | ICD-10-CM | POA: Diagnosis not present

## 2015-07-26 DIAGNOSIS — R29898 Other symptoms and signs involving the musculoskeletal system: Secondary | ICD-10-CM | POA: Diagnosis not present

## 2015-07-26 DIAGNOSIS — R6889 Other general symptoms and signs: Secondary | ICD-10-CM | POA: Diagnosis not present

## 2015-07-26 DIAGNOSIS — M25661 Stiffness of right knee, not elsewhere classified: Secondary | ICD-10-CM

## 2015-07-26 NOTE — Therapy (Signed)
[place], [place], [address] Phone: [phone]   Fax:  [phone]  Physical Therapy Treatment  Patient Details  Name: Shawna [name] MRN: [id number] Date of Birth: [birth year] Referring Provider: Joni Fears MD  Encounter Date: 07/26/2015      PT End of Session - 07/26/15 0844    Visit Number 4   Number of Visits 12   Date for PT Re-Evaluation 08/26/15   Authorization Type ACO Registry   PT Start Time 0845   PT Stop Time 0945   PT Time Calculation (min) 60 min      Past Medical History  Diagnosis Date  . Thyroid disease   . Chronic pain   . Osteoporosis     s/p forteo  . Iron deficiency anemia   . Chronic kidney disease   . Arthritis   . Hyperlipidemia     Past Surgical History  Procedure Laterality Date  . Cholecystectomy    . Joint replacement      right hip x 3 surgeries    There were no vitals filed for this visit.  Visit Diagnosis:  Decreased ROM of right knee  Difficulty walking  Decreased strength involving knee joint  Activity intolerance      Subjective Assessment - 07/26/15 0848    Subjective Still pain on the knee cap. I can bend my knee but not all the way.   Currently in Pain? Yes  moderate                         [facility] Adult PT Treatment/Exercise - 07/26/15 0001    Knee/Hip Exercises: Stretches   Active Hamstring Stretch 3 reps;30 seconds   Hip Flexor Stretch 3 reps;30 seconds   Knee: Self-Stretch to increase Flexion 1 rep;10 seconds   Knee: Self-Stretch Limitations prone to difficult for sheet assisted stretch    Gastroc Stretch 3 reps;30 seconds   Gastroc Stretch Limitations seated with sheet   Knee/Hip Exercises: Supine   Quad Sets 10 reps   Quad Sets Limitations max cues throughout   [employer] Sets 2 sets;10 reps   Short Arc Quad Sets Limitations 3#   Heel Slides 3 sets   Heel Slides Limitations 15 sec with sheet assist   Straight Leg Raises Right;3 sets;10 reps   Patellar Mobs Pt with patellar mobs in inferior mobs during prone knee flexion PROM and supine   Moist Heat Therapy   Number Minutes Moist Heat 15 Minutes   Moist Heat Location Knee   Manual Therapy   Manual Therapy Soft tissue mobilization   Soft tissue mobilization peri patellar musculature and IASTYM to Right quad                 PT Education - 07/26/15 0931    Education provided Yes   Education Details assisted heel slide, prone knee flexion, hamstring stretch, calf stretch    Person(s) Educated Patient   Methods Explanation;Handout   Comprehension Verbalized understanding          PT Short Term Goals - 07/26/15 0857    PT SHORT TERM GOAL #1   Title "Independent with initial HEP   Time 3   Period Weeks   Status Partially Met   PT SHORT TERM GOAL #2   Title "Report pain decrease when bending knee from   9/10 to  5 /10.   Time 3   Period Weeks   Status Achieved  PT SHORT TERM GOAL #3   Title "Demonstrate and verbalize understanding of condition management including RICE, positioning, use of A.D., HEP.    Time 3   Period Weeks   Status Achieved           PT Long Term Goals - 07/19/15 1546    PT LONG TERM GOAL #1   Title "Pt will be independent with advanced HEP.    Time 6   Period Weeks   Status On-going   PT LONG TERM GOAL #2   Title "Pain will decrease to 2/10 when bending knee with all functional activities   Time 6   Period Weeks   Status On-going   PT LONG TERM GOAL #3   Title "Pt will tolerate standing and walking for 2 hours without increased pain in order to return to PLOF doing laundry and walking dogs   Time 6   Period Weeks   Status On-going   PT LONG TERM GOAL #4   Title "FOTO will improve from 53% limitation   to   38%  indicating improved functional mobility    Time 6   Period Weeks   Status On-going   PT LONG TERM GOAL #5   Title Pt will be able to bend knee to at least 110 flexion to  improve ability to negotiate steps and no longer using immobilizer   Time 6   Period Weeks   Status On-going               Plan - 07/26/15 0936    Clinical Impression Statement Pt still wearing initial Mcconnel's tape so removed and will assess for need to reapply next visit. Pt reports moderate pain with bending knee, no longer severe pain. STG# 2,3, MET. Focused today on knee flexion ROM with pt initally measuring 75 degrees at start of treatment and 84 degrees at end of treatment. Updated HEP to include knee stretches and knee flexion AROM and AAROM.    PT Next Visit Plan Progress knee flexion as pt allows.  Review new HEP, continue Manual to increase knee flexion, quad strength.         Problem List Patient Active Problem List   Diagnosis Date Noted  . Anemia 06/14/2015  . Loss of weight 08/26/2013  . Hyperlipidemia 02/08/2012  . Osteoporosis 08/10/2011  . Chronic pain 01/27/2011  . Mental retardation 01/27/2011  . Hypothyroid 01/26/2011    Dorene Ar, PTA 07/26/2015, 9:42 AM  [facility] [address] [place], [place], [address] Phone: [phone]   Fax:  [phone]  Name: Shawna [name] MRN: [id number] Date of Birth: 1956-04-30

## 2015-07-26 NOTE — Patient Instructions (Addendum)
Gastroc / Heel Cord Stretch - Seated With Towel    Sit on couch/chair, towel around ball of foot. Gently pull foot in toward body, stretching heel cord and calf. Hold for _30__ seconds. Repeat __3_ times. Do _3__ times per day.  Copyright  VHI. All rights reserved.  Hamstring Step 1    Straighten left knee. Keep knee level with other knee or on bolster. Hold _30_ seconds. Relax knee by returning foot to start. Repeat __3_ times.  Copyright  VHI. All rights reserved.  KNEE: Flexion - Prone    Bend knee. Raise heel toward buttocks. Do not raise hips. __10_ reps per set, _3__ sets per session, _7__ days per week   Copyright  VHI. All rights reserved.  HIP / KNEE: Flexion, Heel Slides - Supine    Slide right  heel up toward buttocks, keeping leg in straight line. HOLD 30 SEC. USE SHEET TIED AROUND FOOT TO PULL AND HOLD>  _3__ reps per set, _2__ sets per day, _7__ days per week Use towel or pillowcase under heel as needed.  Copyright  VHI. All rights reserved.

## 2015-07-29 ENCOUNTER — Ambulatory Visit: Payer: Medicare Other | Attending: Orthopaedic Surgery | Admitting: Physical Therapy

## 2015-07-29 DIAGNOSIS — R6889 Other general symptoms and signs: Secondary | ICD-10-CM | POA: Insufficient documentation

## 2015-07-29 DIAGNOSIS — R262 Difficulty in walking, not elsewhere classified: Secondary | ICD-10-CM | POA: Diagnosis not present

## 2015-07-29 DIAGNOSIS — R29898 Other symptoms and signs involving the musculoskeletal system: Secondary | ICD-10-CM | POA: Diagnosis not present

## 2015-07-29 DIAGNOSIS — M25661 Stiffness of right knee, not elsewhere classified: Secondary | ICD-10-CM

## 2015-07-29 NOTE — Therapy (Signed)
[place], [place], [address] Phone: [phone]   Fax:  [phone]  Physical Therapy Treatment  Patient Details  Name: Shawna [name] MRN: [id number] Date of Birth: Apr 08, 1956 Referring Provider: Joni Fears MD  Encounter Date: 07/29/2015      PT End of Session - 07/29/15 1259    Visit Number 5   Number of Visits 12   Date for PT Re-Evaluation 08/26/15   Authorization Type ACO Registry   PT Start Time 0845   PT Stop Time 0940   PT Time Calculation (min) 55 min   Activity Tolerance Patient tolerated treatment well   Behavior During Therapy [facility] for tasks assessed/performed      Past Medical History  Diagnosis Date  . Thyroid disease   . Chronic pain   . Osteoporosis     s/p forteo  . Iron deficiency anemia   . Chronic kidney disease   . Arthritis   . Hyperlipidemia     Past Surgical History  Procedure Laterality Date  . Cholecystectomy    . Joint replacement      right hip x 3 surgeries    There were no vitals filed for this visit.  Visit Diagnosis:  Decreased ROM of right knee  Difficulty walking  Decreased strength involving knee joint  Activity intolerance      Subjective Assessment - 07/29/15 0851    Subjective Still pain on the knee cap. I can bend my knee but not all the way. Pain is better than last time   Patient Stated Goals Pt wont bed due to fear of  patella being out of place.  wants to be able to get knee stronger   Currently in Pain? Yes   Pain Score 1    Pain Location Knee   Pain Orientation Right   Pain Descriptors / Indicators Spasm   Pain Onset More than a month ago            [facility] PT Assessment - 07/29/15 0921    AROM   Right Knee Flexion 91  AAROM 96                     [facility] Adult PT Treatment/Exercise - 07/29/15 0855    Lumbar Exercises: Stretches   Active Hamstring Stretch 3 reps;30 seconds   Knee/Hip Exercises: Stretches   Hip Flexor Stretch 3 reps;30 seconds   Knee: Self-Stretch to increase Flexion 1 rep;10 seconds   Knee: Self-Stretch Limitations sitting using towel needs mod cues   Gastroc Stretch 3 reps;30 seconds;2 reps   Gastroc Stretch Limitations seated with sheet 2x in standing for right gastron   Knee/Hip Exercises: Standing   Knee Flexion Strengthening;1 set;Right   Knee Flexion Limitations holding onto counter with 2 pounds to about 45 degrees   Other Standing Knee Exercises terminal knee ext with greeen t band x 10 and with ball on wall x 10 on right   Knee/Hip Exercises: Supine   Quad Sets 10 reps   Quad Sets Limitations PT with faciliation of quad    Short Arc Quad Sets 2 sets;10 reps   Heel Slides 3 sets   Heel Slides Limitations 15 sec with towel  assist   Straight Leg Raises Right;3 sets;10 reps   Straight Leg Raises Limitations with 3 pound for 3 and then 10 with 2 pounds   Patellar Mobs Pt with patellar mobs in inferior mobs during supine slight knee bend  Other Supine Knee/Hip Exercises Pt supine with sheet around Right thigh with 3 lb weight to assist with bend for 30 seconds x 3   Moist Heat Therapy   Number Minutes Moist Heat 12 Minutes   Moist Heat Location Knee   Manual Therapy   Manual Therapy Soft tissue mobilization   Joint Mobilization tibiofibular and  caudal glide of patella for increased flexion of knee   Soft tissue mobilization --   McConnell Right knee lateral to medical for subluxating patella   Ankle Exercises: Stretches   Gastroc Stretch 2 reps;30 seconds  with PT soft tissue work in standing on Producer, television/film/video Limitations pt tends to lift heel off ground and ER ankle in stance   Other Stretch sitting knee flexion on edge of mat hold 20 to 30 sec x 2                PT Education - 07/29/15 0920    Education provided Yes   Education Details Added to HEP for TKE and gastroc stretch.  mcconnell taping for comfort and to decrease anxiety of pt.  for subluxation   Person(s) Educated Patient   Methods Explanation;Demonstration;Tactile cues;Verbal cues;Handout   Comprehension Verbalized understanding;Returned demonstration          PT Short Term Goals - 07/26/15 0857    PT SHORT TERM GOAL #1   Title "Independent with initial HEP   Time 3   Period Weeks   Status Partially Met   PT SHORT TERM GOAL #2   Title "Report pain decrease when bending knee from   9/10 to  5 /10.   Time 3   Period Weeks   Status Achieved   PT SHORT TERM GOAL #3   Title "Demonstrate and verbalize understanding of condition management including RICE, positioning, use of A.D., HEP.    Time 3   Period Weeks   Status Achieved           PT Long Term Goals - 07/19/15 1546    PT LONG TERM GOAL #1   Title "Pt will be independent with advanced HEP.    Time 6   Period Weeks   Status On-going   PT LONG TERM GOAL #2   Title "Pain will decrease to 2/10 when bending knee with all functional activities   Time 6   Period Weeks   Status On-going   PT LONG TERM GOAL #3   Title "Pt will tolerate standing and walking for 2 hours without increased pain in order to return to PLOF doing laundry and walking dogs   Time 6   Period Weeks   Status On-going   PT LONG TERM GOAL #4   Title "FOTO will improve from 53% limitation   to   38%  indicating improved functional mobility    Time 6   Period Weeks   Status On-going   PT LONG TERM GOAL #5   Title Pt will be able to bend knee to at least 110 flexion to improve ability to negotiate steps and no longer using immobilizer   Time 6   Period Weeks   Status On-going               Plan - 07/29/15 1300    Clinical Impression Statement Pt prefers to exericise with the McConnell tape and states she does her exerciises better with tape on..  Pt is 1/10 pain today and able to bend right knee to 91 degrees and  [place]  Pt improving in functional flexion of knee.     Pt will benefit from skilled therapeutic  intervention in order to improve on the following deficits Abnormal gait;Decreased activity tolerance;Postural dysfunction;Pain;Decreased mobility;Decreased strength;Decreased range of motion;Difficulty walking   Rehab Potential Good   PT Frequency 2x / week   PT Duration 6 weeks   PT Next Visit Plan Progress knee flexion as pt allows.  Review new HEP, continue Manual to increase knee flexion, quad strength.    PT Home Exercise Plan continue HEP and added hip flexor stretch and theraband red for knee ext/flex in sitting added gastroc standing, sitting knee flexion and TKE with red t band   Consulted and Agree with Plan of Care Patient;Family member/caregiver        Problem List Patient Active Problem List   Diagnosis Date Noted  . Anemia 06/14/2015  . Loss of weight 08/26/2013  . Hyperlipidemia 02/08/2012  . Osteoporosis 08/10/2011  . Chronic pain 01/27/2011  . Mental retardation 01/27/2011  . Hypothyroid 01/26/2011    Voncille Lo, PT 07/29/2015 1:04 PM Phone: [phone] Fax: [facility] [address] [place], [place], [address] Phone: [phone]   Fax:  [phone]  Name: Therma Lasure MRN: [id number] Date of Birth: Apr 15, 1956

## 2015-07-29 NOTE — Patient Instructions (Addendum)
  Achilles / Gastroc, Standing   Stand, right foot behind, heel on floor and turned slightly out, leg straight, forward leg bent. Move hips forward. Hold _30__ seconds. Repeat _3__ times per session. Do _3__ sessions per day.      Dorsiflexion: Self-Mobilization (Sitting)   Feet flat, other foot forward, slide left foot back until gentle stretch is felt. Keep entire foot on floor. Hold _30___ seconds. Relax. Repeat __3__ times per set. Do __3__ sessions per day.  Knee Extension: Terminal - Standing (Single Leg)   Face anchor in shoulder width stance, band around knee. Allow tension of band to slightly bend knee. Pull leg back, straightening knee. Repeat _10_ times per set. Repeat with other leg. Do _3_ sets per session. Do _6_ sessions per week. Anchor Height: Knee  Voncille Lo, PT 07/29/2015 9:19 AM Phone: [phone] Fax: [phone]

## 2015-08-03 ENCOUNTER — Ambulatory Visit: Payer: Medicare Other | Admitting: Physical Therapy

## 2015-08-03 DIAGNOSIS — R29898 Other symptoms and signs involving the musculoskeletal system: Secondary | ICD-10-CM | POA: Diagnosis not present

## 2015-08-03 DIAGNOSIS — R262 Difficulty in walking, not elsewhere classified: Secondary | ICD-10-CM | POA: Diagnosis not present

## 2015-08-03 DIAGNOSIS — R6889 Other general symptoms and signs: Secondary | ICD-10-CM

## 2015-08-03 DIAGNOSIS — M25661 Stiffness of right knee, not elsewhere classified: Secondary | ICD-10-CM

## 2015-08-03 NOTE — Therapy (Signed)
[place], [place], [address] Phone: [phone]   Fax:  [phone]  Physical Therapy Treatment  Patient Details  Name: Shawna [name] MRN: [id number] Date of Birth: 02-22-1956 Referring Provider: Joni Fears MD  Encounter Date: 08/03/2015      PT End of Session - 08/03/15 1239    Visit Number 6   Number of Visits 12   Date for PT Re-Evaluation 08/26/15   Authorization Type ACO Registry   PT Start Time [phone]   PT Stop Time 0944   PT Time Calculation (min) 58 min   Activity Tolerance Patient tolerated treatment well   Behavior During Therapy [facility] for tasks assessed/performed      Past Medical History  Diagnosis Date  . Thyroid disease   . Chronic pain   . Osteoporosis     s/p forteo  . Iron deficiency anemia   . Chronic kidney disease   . Arthritis   . Hyperlipidemia     Past Surgical History  Procedure Laterality Date  . Cholecystectomy    . Joint replacement      right hip x 3 surgeries    There were no vitals filed for this visit.  Visit Diagnosis:  Decreased ROM of right knee  Difficulty walking  Decreased strength involving knee joint  Activity intolerance      Subjective Assessment - 08/03/15 0851    Subjective I like the tape  I took a shower   Pertinent History Mental Retardation, Right THR  > 10 years   Limitations Walking;Standing   Patient Stated Goals Pt wont bed due to fear of  patella being out of place.  wants to be able to get knee stronger   Currently in Pain? Yes   Pain Score 2    Pain Location Knee   Pain Orientation Right   Pain Descriptors / Indicators Aching   Pain Type Chronic pain   Pain Onset More than a month ago   Pain Frequency Occasional            [facility] PT Assessment - 08/03/15 0933    AROM   Right Knee Flexion 93  AAROM 97                     [facility] Adult PT Treatment/Exercise - 08/03/15 0853    Knee/Hip Exercises:  Stretches   Active Hamstring Stretch 3 reps;30 seconds   Hip Flexor Stretch 3 reps;30 seconds   Knee: Self-Stretch to increase Flexion 10 seconds;5 reps   Knee: Self-Stretch Limitations sitting using towel needs mod cues with 3 lb weight   Gastroc Stretch --   Gastroc Stretch Limitations --   Knee/Hip Exercises: Aerobic   Recumbent Bike 5 minutes  1/2 revolution   Knee/Hip Exercises: Standing   Knee Flexion --   Knee Flexion Limitations --   Other Standing Knee Exercises terminal knee ext  x 10 and with ball on wall x 10 on right   Other Standing Knee Exercises sit to stand with wt shift to right x10  pt wt shifts to left and need approximation to right   Knee/Hip Exercises: Supine   Quad Sets 10 reps   Short Arc Quad Sets 2 sets;10 reps   Short Arc Quad Sets Limitations 3 lb wt   Heel Slides --   Heel Slides Limitations --   Bridges Limitations 3 x 10 bridge    Bridges with Clamshell Strengthening;Both;10 reps  x 2 with  green t band   Straight Leg Raises Right;3 sets;10 reps   Straight Leg Raises Limitations 2 lb wt.   Patellar Mobs Pt with patellar mobs in inferior mobs during supine slight knee bend    Other Supine Knee/Hip Exercises --   Other Supine Knee/Hip Exercises Pilates reformer foot work externalrotated and knee ext and flex 3 x 10    Moist Heat Therapy   Number Minutes Moist Heat 15 Minutes   Moist Heat Location Knee   Manual Therapy   Manual Therapy Soft tissue mobilization   Joint Mobilization tibiofibular and  caudal glide of patella for increased flexion of knee   Soft tissue mobilization peri patellar musculature and IASTYM to Right quad    McConnell Right knee lateral to medical for subluxating patella   Ankle Exercises: Stretches   Gastroc Stretch --   Press photographer Limitations --   Other Stretch --                PT Education - 08/03/15 [phone]    Education provided Yes   Education Details added to HEP bridge and supine clamshell with green t  band    Person(s) Educated Patient   Methods Explanation;Demonstration;Tactile cues;Verbal cues;Handout   Comprehension Verbalized understanding;Returned demonstration          PT Short Term Goals - 07/26/15 0857    PT SHORT TERM GOAL #1   Title "Independent with initial HEP   Time 3   Period Weeks   Status Partially Met   PT SHORT TERM GOAL #2   Title "Report pain decrease when bending knee from   9/10 to  5 /10.   Time 3   Period Weeks   Status Achieved   PT SHORT TERM GOAL #3   Title "Demonstrate and verbalize understanding of condition management including RICE, positioning, use of A.D., HEP.    Time 3   Period Weeks   Status Achieved           PT Long Term Goals - 07/19/15 1546    PT LONG TERM GOAL #1   Title "Pt will be independent with advanced HEP.    Time 6   Period Weeks   Status On-going   PT LONG TERM GOAL #2   Title "Pain will decrease to 2/10 when bending knee with all functional activities   Time 6   Period Weeks   Status On-going   PT LONG TERM GOAL #3   Title "Pt will tolerate standing and walking for 2 hours without increased pain in order to return to PLOF doing laundry and walking dogs   Time 6   Period Weeks   Status On-going   PT LONG TERM GOAL #4   Title "FOTO will improve from 53% limitation   to   38%  indicating improved functional mobility    Time 6   Period Weeks   Status On-going   PT LONG TERM GOAL #5   Title Pt will be able to bend knee to at least 110 flexion to improve ability to negotiate steps and no longer using immobilizer   Time 6   Period Weeks   Status On-going               Plan - 08/03/15 1240    Clinical Impression Statement Pt prefers to exericise with McConnell tape and she has increased AROM 93 and AAROM 97 of right knee. Pt does not tolerate end range flexion mobs and does better with  movement . Pt with Right hip weakness and she  intermally rotates  right knee during exericises .  Pt needs to perform  hip core strength to maximize function of knee   Pt will benefit from skilled therapeutic intervention in order to improve on the following deficits Abnormal gait;Decreased activity tolerance;Postural dysfunction;Pain;Decreased mobility;Decreased strength;Decreased range of motion;Difficulty walking   Rehab Potential Good   PT Frequency 2x / week   PT Duration 6 weeks   PT Next Visit Plan Progress knee flexion as pt allows.  Review new HEP, continue Manual to increase knee flexion, quad strength.  Add hip strength as needed    PT Home Exercise Plan HEP and hip bridge, clamshell in supine with green t band   Consulted and Agree with Plan of Care Patient        Problem List Patient Active Problem List   Diagnosis Date Noted  . Anemia 06/14/2015  . Loss of weight 08/26/2013  . Hyperlipidemia 02/08/2012  . Osteoporosis 08/10/2011  . Chronic pain 01/27/2011  . Mental retardation 01/27/2011  . Hypothyroid 01/26/2011   Voncille Lo, PT 08/03/2015 12:44 PM Phone: [phone] Fax: [facility] [place], [place], [address] Phone: [phone]   Fax:  [phone]  Name: Shawna [name] MRN: [id number] Date of Birth: [birth year]

## 2015-08-03 NOTE — Patient Instructions (Signed)
Bridge    Lie back, legs bent. Inhale, pressing hips up. Keeping ribs in, lengthen lower back. Exhale, rolling down along spine from top. 3 x 10 repetitions . Use 2-3 pound weight Do  2__ sessions per day.  http://pm.exer.us/55   Copyright  VHI. All rights reserved.  External Rotation: Hip - Knees Apart (Hook-Lying)    Lie with hips and knees bent, band tied just above knees. Pull knees apart. Hold for _3-5__ seconds.  Repeat 10 x 3___ times. Do _2__ times a day.  Copyright  VHI. All rights reserved.  Shawna [name], PT 08/03/2015 9:17 AM Phone: [phone] Fax: [phone]

## 2015-08-05 ENCOUNTER — Ambulatory Visit: Payer: Medicare Other

## 2015-08-05 DIAGNOSIS — R29898 Other symptoms and signs involving the musculoskeletal system: Secondary | ICD-10-CM

## 2015-08-05 DIAGNOSIS — M25661 Stiffness of right knee, not elsewhere classified: Secondary | ICD-10-CM

## 2015-08-05 DIAGNOSIS — R262 Difficulty in walking, not elsewhere classified: Secondary | ICD-10-CM | POA: Diagnosis not present

## 2015-08-05 DIAGNOSIS — R6889 Other general symptoms and signs: Secondary | ICD-10-CM | POA: Diagnosis not present

## 2015-08-05 NOTE — Therapy (Signed)
[place], [place], [address] Phone: [phone]   Fax:  [phone]  Physical Therapy Treatment  Patient Details  Name: Shawna [name] MRN: [id number] Date of Birth: 21-Jul-1955 Referring Provider: Joni Fears MD  Encounter Date: 08/05/2015      PT End of Session - 08/05/15 0905    Visit Number 7   Number of Visits 12   Date for PT Re-Evaluation 08/26/15   Authorization Type ACO Registry   PT Start Time [phone]   PT Stop Time 0953   PT Time Calculation (min) 63 min   Activity Tolerance Patient tolerated treatment well      Past Medical History  Diagnosis Date  . Thyroid disease   . Chronic pain   . Osteoporosis     s/p forteo  . Iron deficiency anemia   . Chronic kidney disease   . Arthritis   . Hyperlipidemia     Past Surgical History  Procedure Laterality Date  . Cholecystectomy    . Joint replacement      right hip x 3 surgeries    There were no vitals filed for this visit.  Visit Diagnosis:  Decreased ROM of right knee  Difficulty walking  Decreased strength involving knee joint  Activity intolerance      Subjective Assessment - 08/05/15 0900    Subjective Pt rates pain 2/10. Pt is asking whether she can walk without use of patellar tracking brace.    Currently in Pain? Yes   Pain Score 2    Pain Location Knee   Pain Orientation Right   Pain Descriptors / Indicators Aching            [facility] PT Assessment - 08/05/15 0001    AROM   Right Knee Flexion 95                     [facility] Adult PT Treatment/Exercise - 08/05/15 0001    Ambulation/Gait   Gait Comments Gait training x 21 mins at parallel bars using part vs whole technique to promote knee flexion during swing phase of gait and with toe off, knee flexion, and heel strike. PT used tactile cues and PNF manual technique for activation of mm to provide proper sequence during gait. Pt demonstrated proper  technique following training, with constant VCs, but without need for tactile cues.    Knee/Hip Exercises: Supine   Quad Sets 20 reps   Quad Sets Limitations with VCs    Short Arc Quad Sets 2 sets;10 reps   Short Arc Quad Sets Limitations 2 lbs with hip ADD   Heel Slides 3 sets;10 reps   Bridges with Ball Squeeze 2 sets;10 reps   Straight Leg Raises Right;3 sets;10 reps  VCs to prevent extension lag   Straight Leg Raises Limitations 2 lb wt.   Other Supine Knee/Hip Exercises knee flexion with physioball x 20    Knee/Hip Exercises: Sidelying   Hip ADduction --   Knee/Hip Exercises: Prone   Hamstring Curl 20 reps   Hip Extension 20 reps   Moist Heat Therapy   Number Minutes Moist Heat 8 Minutes   Moist Heat Location Knee                PT Education - 08/05/15 1431    Education provided Yes   Education Details Gait training with instructions for knee flexion during swing phase of gait. Instructed pt to take off tape in another 1-2  days.    Person(s) Educated Patient   Methods Explanation;Demonstration;Handout   Comprehension Verbalized understanding;Returned demonstration          PT Short Term Goals - 07/26/15 0857    PT SHORT TERM GOAL #1   Title "Independent with initial HEP   Time 3   Period Weeks   Status Partially Met   PT SHORT TERM GOAL #2   Title "Report pain decrease when bending knee from   9/10 to  5 /10.   Time 3   Period Weeks   Status Achieved   PT SHORT TERM GOAL #3   Title "Demonstrate and verbalize understanding of condition management including RICE, positioning, use of A.D., HEP.    Time 3   Period Weeks   Status Achieved           PT Long Term Goals - 07/19/15 1546    PT LONG TERM GOAL #1   Title "Pt will be independent with advanced HEP.    Time 6   Period Weeks   Status On-going   PT LONG TERM GOAL #2   Title "Pain will decrease to 2/10 when bending knee with all functional activities   Time 6   Period Weeks   Status  On-going   PT LONG TERM GOAL #3   Title "Pt will tolerate standing and walking for 2 hours without increased pain in order to return to PLOF doing laundry and walking dogs   Time 6   Period Weeks   Status On-going   PT LONG TERM GOAL #4   Title "FOTO will improve from 53% limitation   to   38%  indicating improved functional mobility    Time 6   Period Weeks   Status On-going   PT LONG TERM GOAL #5   Title Pt will be able to bend knee to at least 110 flexion to improve ability to negotiate steps and no longer using immobilizer   Time 6   Period Weeks   Status On-going               Plan - 08/05/15 6720    Clinical Impression Statement Pt required max VCs and tactile initially to prevent extension lag with SLR. Added SLR hip ext without pain or difficutly.  Pt is eager to walk without use of brace, but discussed using brace and performing good knee flexion with swing phase of gait. Gait training x 21 mins at parallel bars using part vs whole technique to promote knee flexion during swing phase of gait and with toe off, knee flexion, and heel strike. PT used tactile cues and PNF manual technique for activation of mm to provide proper sequence during gait. without use of brace in parallel bars. Pt demonstrated proper technique following training, with constant VCs, but without need for tactile cues and with brace donned.    PT Next Visit Plan Progress knee flexion as pt allows.  Review new HEP, continue Manual to increase knee flexion, quad strength.  Add hip strength as needed    Consulted and Agree with Plan of Care Patient        Problem List Patient Active Problem List   Diagnosis Date Noted  . Anemia 06/14/2015  . Loss of weight 08/26/2013  . Hyperlipidemia 02/08/2012  . Osteoporosis 08/10/2011  . Chronic pain 01/27/2011  . Mental retardation 01/27/2011  . Hypothyroid 01/26/2011    [name], PT 08/05/2015, 2:35 PM  [facility] [address]  [place], [place], [address] Phone: [phone]   Fax:  [phone]  Name: Shawna [name] MRN: [id number] Date of Birth: 1956/06/11

## 2015-08-09 ENCOUNTER — Ambulatory Visit: Payer: Medicare Other | Admitting: Physical Therapy

## 2015-08-09 DIAGNOSIS — R6889 Other general symptoms and signs: Secondary | ICD-10-CM | POA: Diagnosis not present

## 2015-08-09 DIAGNOSIS — R262 Difficulty in walking, not elsewhere classified: Secondary | ICD-10-CM | POA: Diagnosis not present

## 2015-08-09 DIAGNOSIS — M25661 Stiffness of right knee, not elsewhere classified: Secondary | ICD-10-CM

## 2015-08-09 DIAGNOSIS — R29898 Other symptoms and signs involving the musculoskeletal system: Secondary | ICD-10-CM | POA: Diagnosis not present

## 2015-08-09 NOTE — Therapy (Addendum)
[place], [place], [address] Phone: [phone]   Fax:  [phone]  Physical Therapy Treatment  Patient Details  Name: Shawna [name] MRN: [id number] Date of Birth: Jun 20, 1956 Referring Provider: Joni Fears MD  Encounter Date: 08/09/2015      PT End of Session - 08/09/15 0921    Visit Number 8   Number of Visits 12   Date for PT Re-Evaluation 08/26/15   Authorization Type ACO Registry   PT Start Time [phone]   PT Stop Time 0948   PT Time Calculation (min) 66 min      Past Medical History  Diagnosis Date  . Thyroid disease   . Chronic pain   . Osteoporosis     s/p forteo  . Iron deficiency anemia   . Chronic kidney disease   . Arthritis   . Hyperlipidemia     Past Surgical History  Procedure Laterality Date  . Cholecystectomy    . Joint replacement      right hip x 3 surgeries    There were no vitals filed for this visit.  Visit Diagnosis:  Decreased ROM of right knee - Plan: PT plan of care cert/re-cert  Difficulty walking - Plan: PT plan of care cert/re-cert  Decreased strength involving knee joint - Plan: PT plan of care cert/re-cert  Activity intolerance - Plan: PT plan of care cert/re-cert          [facility] PT Assessment - 08/09/15 0001    AROM   Right Knee Flexion 102  AAROM                     [facility] Adult PT Treatment/Exercise - 08/09/15 0001    Knee/Hip Exercises: Aerobic   Recumbent Bike 5 minutes  1/2 revolution   Knee/Hip Exercises: Machines for Strengthening   Other Machine Pilates Reformer 2 red bilateral on heels then toes with ball between knees for alignment. 1 red single left leg - improvroved tolerance to flexion   Moist Heat Therapy   Number Minutes Moist Heat 15 Minutes   Moist Heat Location Knee   Manual Therapy   Manual Therapy Soft tissue mobilization   Soft tissue mobilization peri patellar musculature and  to Right quad    McConnell  Right knee lateral to medical for subluxating patella  end of tx     Gait training with emphasis on knee/hip flexion, neutral hip and dorsiflexion/heel strike Ultrasound 100% 1Mhz 1.4 w/cm2 x 8  Min-reduced to 53mz after 4 minutes due to c/o discomfort.                PT Short Term Goals - 07/26/15 0857    PT SHORT TERM GOAL #1   Title "Independent with initial HEP   Time 3   Period Weeks   Status Partially Met   PT SHORT TERM GOAL #2   Title "Report pain decrease when bending knee from   9/10 to  5 /10.   Time 3   Period Weeks   Status Achieved   PT SHORT TERM GOAL #3   Title "Demonstrate and verbalize understanding of condition management including RICE, positioning, use of A.D., HEP.    Time 3   Period Weeks   Status Achieved           PT Long Term Goals - 08/09/15 0951    PT LONG TERM GOAL #1   Title "Pt will be independent with advanced HEP.  Time 6   Period Weeks   Status On-going   PT LONG TERM GOAL #2   Title "Pain will decrease to 2/10 when bending knee with all functional activities   Time 6   Period Weeks   Status On-going   PT LONG TERM GOAL #3   Title "Pt will tolerate standing and walking for 2 hours without increased pain in order to return to PLOF doing laundry and walking dogs   Baseline 1 hour   Time 6   Period Weeks   Status On-going   PT LONG TERM GOAL #4   Title "FOTO will improve from 53% limitation   to   38%  indicating improved functional mobility    Time 6   Period Weeks   Status Unable to assess   PT LONG TERM GOAL #5   Title Pt will be able to bend knee to at least 110 flexion to improve ability to negotiate steps and no longer using immobilizer   Baseline 102   Time 6   Period Weeks   Status On-going               Plan - 08/09/15 0855    Clinical Impression Statement Pt enters with Mcconnel tape on from last viist. She reports getting in wet in shower yesterday and it is still wet. Continued education on the  need to remove after 2 days. Pt given info on where to purchase tape so that she can remove and reapply appropriately. Tape removed and trial of Rec bike with pt unable to make full revolutions and c/o rectus femoris tightness. [place] used at 100% to loosen distal rectus with flexion ROM improved from 92 to 102 AAROM after [place]. Pilates Reformer used for LE strengthening post ultrasound to tolerance. Tape reapllied at end of treatment per pt request. Poor carryover on gait mechanics as pt requires constant verbal cues to flex at hip/knee with gait. She reports continued fear of patella dislocation with knee flexion activites. She reports 1 hour of standing and walking tolerance.                        PT Next Visit Plan Progress knee flexion as pt allows.  Review new HEP, continue Manual to increase knee flexion, quad strength.  Add hip strength as needed    PT Home Exercise Plan HEP and hip bridge, clamshell in supine with green t band; see what MD said, did she get tape? continue [place] for flexibility; FOTO   Consulted and Agree with Plan of Care Patient        Problem List Patient Active Problem List   Diagnosis Date Noted  . Anemia 06/14/2015  . Loss of weight 08/26/2013  . Hyperlipidemia 02/08/2012  . Osteoporosis 08/10/2011  . Chronic pain 01/27/2011  . Mental retardation 01/27/2011  . Hypothyroid 01/26/2011    Dorene Ar, PTA 08/09/2015, 10:25 AM  [facility] [place], [place], [address] Phone: [phone]   Fax:  [phone]  Name: Shawna [name] MRN: [id number] Date of Birth: 1956-01-13

## 2015-08-12 ENCOUNTER — Ambulatory Visit: Payer: Medicare Other | Admitting: Physical Therapy

## 2015-08-12 DIAGNOSIS — R262 Difficulty in walking, not elsewhere classified: Secondary | ICD-10-CM | POA: Diagnosis not present

## 2015-08-12 DIAGNOSIS — R6889 Other general symptoms and signs: Secondary | ICD-10-CM | POA: Diagnosis not present

## 2015-08-12 DIAGNOSIS — M25661 Stiffness of right knee, not elsewhere classified: Secondary | ICD-10-CM

## 2015-08-12 DIAGNOSIS — R29898 Other symptoms and signs involving the musculoskeletal system: Secondary | ICD-10-CM | POA: Diagnosis not present

## 2015-08-12 NOTE — Patient Instructions (Signed)
MC Connell taping for Right knee  Take  2 kinds of tape.    Cover roll ( white tape)  Leukotape, ( brown tape.)  Cut the cover roll tape 6 inches.  Place two pieces over right knee to cover your knee cap ( Patella)  Then cut the leukotape about 4 1/2 to 5 inches.   Bring top tape from outside of your knee to inside. Bring bottom tape in smiley face from outside of knee to inside of knee as shown in clinic.  Use tape instead of knee brace to walk and do exericises  And on steps.    Step Down: Anterior    Copyright  VHI. All rights reserved.  Forward   Facing step, place one leg on step, flexed at hip. Step up slowly, bringing hips in line with knee and shoulder. Bring other foot onto step. Reverse process to step back down. Repeat with other leg.  Keep you left heel down as you come up step.  Use hand rail for balance. Do _10___ repetitions, ____ sets.  http://bt.exer.us/154   Copyright  VHI. All rights reserved.

## 2015-08-12 NOTE — Therapy (Signed)
[place], [place], [address] Phone: [phone]   Fax:  [phone]  Physical Therapy Treatment/Progress Note  Patient Details  Name: Shawna [name] MRN: [id number] Date of Birth: 07-22-1955 Referring Provider: Joni Fears MD  Encounter Date: 08/12/2015      PT End of Session - 08/12/15 1005    Visit Number 9   Number of Visits 12   Date for PT Re-Evaluation 08/26/15   Authorization Type ACO Registry Progress note sent on 08-12-15   PT Start Time 0846   PT Stop Time 0932   PT Time Calculation (min) 46 min   Activity Tolerance Patient tolerated treatment well   Behavior During Therapy [facility] for tasks assessed/performed      Past Medical History  Diagnosis Date  . Thyroid disease   . Chronic pain   . Osteoporosis     s/p forteo  . Iron deficiency anemia   . Chronic kidney disease   . Arthritis   . Hyperlipidemia     Past Surgical History  Procedure Laterality Date  . Cholecystectomy    . Joint replacement      right hip x 3 surgeries    There were no vitals filed for this visit.  Visit Diagnosis:  Decreased ROM of right knee  Difficulty walking  Decreased strength involving knee joint  Activity intolerance      Subjective Assessment - 08/12/15 0849    Subjective Still wearing brace.  Knee hurts on Right   Patient is accompained by: Family member   Pertinent History Mental Retardation, Right THR  > 10 years   How long can you sit comfortably? unlimited   How long can you stand comfortably? unlimited   How long can you walk comfortably? unlimited   Patient Stated Goals Pt wont bed due to fear of  patella being out of place.  wants to be able to get knee stronger   Currently in Pain? Yes   Pain Score 2    Pain Location Knee   Pain Orientation Right   Pain Descriptors / Indicators Aching   Pain Type Chronic pain            [facility] PT Assessment - 08/12/15 0942    Observation/Other Assessments   Focus on Therapeutic Outcomes (FOTO)  intake 58% limitation 42% predicted    AROM   Right Hip Flexion 108  Pt with hard end feel   Left Hip Flexion 129   Right Knee Extension 0   Right Knee Flexion 103  AAROM 105   Left Knee Extension -3   Left Knee Flexion 142   Strength   Right Hip Flexion 3-/5   Right Hip Extension 4-/5   Right Hip ABduction 4-/5   Left Hip Flexion 4-/5   Left Hip Extension 4-/5   Left Hip ABduction 4-/5   Right Knee Flexion 4/5   Right Knee Extension 4/5   Left Knee Flexion 4/5   Left Knee Extension 4/5   Flexibility   Soft Tissue Assessment /Muscle Length yes   Quadriceps rectus femoris  on right   Ambulation/Gait   Stairs Yes   Stairs Assistance 6: Modified independent (Device/Increase time)  increased time   Stair Management Technique Two rails  Pt circumducts Right LE and lifts heel on left to ascend.    Number of Stairs 4  x 3   Height of Stairs 6   Gait Comments Pt with decreased Hip flexion on Right  [facility] PT Treatment/Exercise - 08/12/15 0942    Knee/Hip Exercises: Standing   Forward Step Up Right;10 reps;Hand Hold: 1  x 2 with PT VC TC   Forward Step Up Limitations Right hip limted to 108 degrees, compensatory hip IR knee valgus    Other Standing Knee Exercises hip knee flexion/ marching with PT assist 10 x 2   Knee/Hip Exercises: Seated   Long Arc Quad 1 set;10 reps;Right   Long Arc Quad Weight --  PT offer resisitance for flex/ext   Ultrasound   Ultrasound Location Right knee   Ultrasound Parameters  3 MZ 1.2 w/cm2  100 % 8 minutes   Ultrasound Goals Pain   Manual Therapy   Joint Mobilization prone talocrural and lateral/medical grade 3  mobs of right ankle.    Pt with spastic response to pressure on great toe/ball foot   McConnell Right knee lateral to medical for subluxating patella  end of tx                PT Education - 08/12/15 0921     Education provided Yes   Education Details McConnell taping education with pt retrun demo  and knee step up exericises   Person(s) Educated Patient   Methods Explanation;Demonstration;Tactile cues;Verbal cues;Handout   Comprehension Verbalized understanding;Returned demonstration          PT Short Term Goals - 08/12/15 1005    PT SHORT TERM GOAL #1   Title "Independent with initial HEP   Time 3   Period Weeks   Status Achieved   PT SHORT TERM GOAL #2   Title "Report pain decrease when bending knee from   9/10 to  5 /10.   Time 3   Period Weeks   Status Achieved   PT SHORT TERM GOAL #3   Title "Demonstrate and verbalize understanding of condition management including RICE, positioning, use of A.D., HEP.    Time 3   Period Weeks   Status Achieved           PT Long Term Goals - 08/12/15 0853    PT LONG TERM GOAL #1   Title "Pt will be independent with advanced HEP.    Time 6   Period Weeks   Status On-going   PT LONG TERM GOAL #2   Title "Pain will decrease to 2/10 when bending knee with all functional activities   Baseline 2/10   Time 6   Period Weeks   Status Achieved   PT LONG TERM GOAL #3   Title "Pt will tolerate standing and walking for 2 hours without increased pain in order to return to PLOF doing laundry and walking dogs   Baseline 1-2 hours   Time 6   Period Weeks   Status Achieved   PT LONG TERM GOAL #4   Title "FOTO will improve from 53% limitation   to   38%  indicating improved functional mobility    Baseline FOTO limtation 42% 08-12-15   Time 6   Period Weeks   Status On-going   PT LONG TERM GOAL #5   Title Pt will be able to bend knee to at least 110 flexion to improve ability to negotiate steps and no longer using immobilizer   Baseline 105   Time 6   Period Weeks   Status On-going               Plan - 08/12/15 [id number]    Clinical Impression Statement Pt enters clinic  with McConnell tape and knee brace. Pt was told she does not need  to wear brace with taping.  and to change every 3 days at very least.  Pt with tight rectus femoris and tends to Internally rotate Right hip and has hard end feel with 108 hip flexion, Pt now about to pull right knee into flexion 103  AAROM 105,.  Pt  now doing steps for exercise with Right leg circumducting  but able to lead with Right leg with increased flexion in the right knee.  Pt  has HEP and has achieved all STG and LTG #2,  Pt able to walk for 1-2 hours wth knee taping.    FOTO improved from 53% limtaiton to 42$  Pt also had decreased ankle mobility which improved with mobilization. Pt will benefit from 1-2  visit for reinforcing HEP and then can be discharged with improved function    Pt will benefit from skilled therapeutic intervention in order to improve on the following deficits Abnormal gait;Decreased activity tolerance;Postural dysfunction;Pain;Decreased mobility;Decreased strength;Decreased range of motion;Difficulty walking   Rehab Potential Good   PT Frequency 2x / week   PT Duration 6 weeks   PT Treatment/Interventions ADLs/Self Care Home Management;Cryotherapy;Electrical Stimulation;Iontophoresis 4mg /ml Dexamethasone;Moist Heat;Therapeutic exercise;Therapeutic activities;Manual techniques;Taping;Dry needling;Passive range of motion;Ultrasound;Patient/family education   PT Next Visit Plan Progress knee flexion as pt allows.  Review new HEP, continue Manual to increase knee flexion, quad strength.  Add hip strength as needed    PT Home Exercise Plan USE mc connel tape instead of brace and continue HEP , reinforce          G-Codes - 08-22-15 1008    Functional Assessment Tool Used FOTO   Functional Limitation Mobility: Walking and moving around   Mobility: Walking and Moving Around Current Status [phone]) At least 40 percent but less than 60 percent impaired, limited or restricted  42% from 53%   Mobility: Walking and Moving Around Goal Status [phone]) At least 20 percent but less  than 40 percent impaired, limited or restricted      Problem List Patient Active Problem List   Diagnosis Date Noted  . Anemia 06/14/2015  . Loss of weight 08/26/2013  . Hyperlipidemia 02/08/2012  . Osteoporosis 08/10/2011  . Chronic pain 01/27/2011  . Mental retardation 01/27/2011  . Hypothyroid 01/26/2011    Voncille Lo, PT 08/22/2015 10:15 AM Phone: [phone] Fax: [facility] [place], [place], [address] Phone: [phone]   Fax:  [phone]  Name: Shawna [name] MRN: [id number] Date of Birth: [birth year]

## 2015-08-13 DIAGNOSIS — S83004S Unspecified dislocation of right patella, sequela: Secondary | ICD-10-CM | POA: Diagnosis not present

## 2015-08-17 ENCOUNTER — Ambulatory Visit: Payer: Medicare Other | Admitting: Physical Therapy

## 2015-08-17 DIAGNOSIS — R6889 Other general symptoms and signs: Secondary | ICD-10-CM | POA: Diagnosis not present

## 2015-08-17 DIAGNOSIS — R262 Difficulty in walking, not elsewhere classified: Secondary | ICD-10-CM

## 2015-08-17 DIAGNOSIS — R29898 Other symptoms and signs involving the musculoskeletal system: Secondary | ICD-10-CM | POA: Diagnosis not present

## 2015-08-17 DIAGNOSIS — M25661 Stiffness of right knee, not elsewhere classified: Secondary | ICD-10-CM

## 2015-08-17 NOTE — Therapy (Signed)
[place], [place], [address] Phone: [phone]   Fax:  [phone]  Physical Therapy Treatment/Discharge Note  Patient Details  Name: Shawna [name] MRN: [id number] Date of Birth: 24-Aug-1955 Referring Provider: Joni Fears MD  Encounter Date: 08/17/2015      PT End of Session - 08/17/15 1256    Visit Number 10   Number of Visits 12   Date for PT Re-Evaluation 08/26/15   Authorization Type ACO Registry Progress note sent on 08-12-15   PT Start Time 1147   PT Stop Time 1245   PT Time Calculation (min) 58 min   Activity Tolerance Patient tolerated treatment well   Behavior During Therapy [facility] for tasks assessed/performed      Past Medical History  Diagnosis Date  . Thyroid disease   . Chronic pain   . Osteoporosis     s/p forteo  . Iron deficiency anemia   . Chronic kidney disease   . Arthritis   . Hyperlipidemia     Past Surgical History  Procedure Laterality Date  . Cholecystectomy    . Joint replacement      right hip x 3 surgeries    There were no vitals filed for this visit.  Visit Diagnosis:  Decreased ROM of right knee  Difficulty walking  Decreased strength involving knee joint  Activity intolerance      Subjective Assessment - 08/17/15 1251    Subjective No longer wearing brace.  Dr. Durward Fortes has released Shawna [name]   Patient is accompained by: Family member   Pertinent History Mental Retardation, Right THR  > 10 years   Limitations Walking;Standing   How long can you sit comfortably? unlimited   How long can you stand comfortably? unlimited   How long can you walk comfortably? unlimited   Patient Stated Goals Pt wont bed due to fear of  patella being out of place.  wants to be able to get knee stronger   Currently in Pain? No/denies   Pain Location Knee   Pain Orientation Right            [facility] PT Assessment - 08/17/15 1242    Observation/Other Assessments   Focus on Therapeutic Outcomes (FOTO)  intake 69% predicted 38 %  limitation today 31%   AROM   Right Hip Flexion 108  Pt with hard end feel   Left Hip Flexion 129   Right Knee Extension 0   Right Knee Flexion 107  AAROM 109   Left Knee Extension -3   Left Knee Flexion 142   Strength   Right Hip Flexion 3-/5   Right Hip Extension 4-/5   Right Hip ABduction 4-/5   Left Hip Flexion 4-/5   Left Hip Extension 4-/5   Left Hip ABduction 4-/5   Right Knee Flexion 4/5   Right Knee Extension 4/5   Left Knee Flexion 4/5   Left Knee Extension 4/5                     [facility] Adult PT Treatment/Exercise - 08/17/15 1244    Self-Care   Self-Care Other Self-Care Comments   Other Self-Care Comments  Pt gave detailed instructions on  how to administer [employer] tape and practiced don/doff tape x 4 with PT VC    Knee/Hip Exercises: Seated   Long Arc Quad 1 set;10 reps;Right   Knee/Hip Exercises: Supine   [employer] 20 reps   Short Arc [employer]  2 sets;10 reps   Bridges with Diona Foley Squeeze 2 sets;10 reps   Straight Leg Raises Right;3 sets;10 reps  VCs to prevent extension lag   Straight Leg Raises Limitations 2 lb wt.   Moist Heat Therapy   Number Minutes Moist Heat 15 Minutes   Moist Heat Location Knee  left   Manual Therapy   McConnell Right knee lateral to medical for subluxating patella  end of tx                PT Education - 08/17/15 1255    Education provided Yes   Education Details McConnell taping education and practice, review exericise    Person(s) Educated Associate Professor)  sister   Methods Explanation;Demonstration   Comprehension Verbalized understanding;Returned demonstration          PT Short Term Goals - 08/12/15 1005    PT SHORT TERM GOAL #1   Title "Independent with initial HEP   Time 3   Period Weeks   Status Achieved   PT SHORT TERM GOAL #2   Title "Report pain decrease when bending knee from   9/10 to  5 /10.   Time 3   Period  Weeks   Status Achieved   PT SHORT TERM GOAL #3   Title "Demonstrate and verbalize understanding of condition management including RICE, positioning, use of A.D., HEP.    Time 3   Period Weeks   Status Achieved           PT Long Term Goals - 08/17/15 1256    PT LONG TERM GOAL #1   Title "Pt will be independent with advanced HEP.    Time 6   Period Weeks   Status Achieved   PT LONG TERM GOAL #2   Title "Pain will decrease to 2/10 when bending knee with all functional activities   Baseline 0/10 with Cherly Anderson tape on   Time 6   Period Weeks   Status Achieved   PT LONG TERM GOAL #3   Title "Pt will tolerate standing and walking for 2 hours without increased pain in order to return to PLOF doing laundry and walking dogs   Baseline  2 hours   Time 6   Period Weeks   Status Achieved   PT LONG TERM GOAL #4   Title "FOTO will improve from 53% limitation   to   38%  indicating improved functional mobility    Baseline FOTO 31%   PT LONG TERM GOAL #5   Title Pt will be able to bend knee to at least 110 flexion to improve ability to negotiate steps and no longer using immobilizer   Baseline 107  AROM flexi  AAROM is 109   Time 6   Period Weeks   Status Partially Met               Plan - 08/17/15 1305    Clinical Impression Statement Pt enters clinic with minimal to 0/10 pain with activities and exericises.  Pt saw Dr. Durward Fortes and he released her .  Pt came for this last visit to reinforce exericiise program and to reinforce abiity to tape Right knee for patellar subluxation .  Pt utilizes tape  and is able to particitpate with exericise without anxiety .  She no longer uses knee barce.  Pt was able to achieve all long term goals.  AROM knee flexion right 107 and AAROM 109. FOTO improved from  53% limtation to 31 %. Maximum range  achieved today.  Pt and family member are pleased with current level and will be discharged.    Pt will benefit from skilled therapeutic  intervention in order to improve on the following deficits Abnormal gait;Decreased activity tolerance;Postural dysfunction;Pain;Decreased mobility;Decreased strength;Decreased range of motion;Difficulty walking   Rehab Potential Good   PT Frequency 2x / week   PT Duration 6 weeks   PT Treatment/Interventions ADLs/Self Care Home Management;Cryotherapy;Electrical Stimulation;Iontophoresis 8m/ml Dexamethasone;Moist Heat;Therapeutic exercise;Therapeutic activities;Manual techniques;Taping;Dry needling;Passive range of motion;Ultrasound;Patient/family education   PT Next Visit Plan DC   PT Home Exercise Plan USE mc connel tape instead of brace and continue HEP , reinforced today   Consulted and Agree with Plan of Care Patient;Family member/caregiver          G-Codes - [id number]    Functional Assessment Tool Used FOTO   Functional Limitation Mobility: Walking and moving around   Mobility: Walking and Moving Around Goal Status [phone] At least 20 percent but less than 40 percent impaired, limited or restricted   Mobility: Walking and Moving Around Discharge Status [phone] At least 20 percent but less than 40 percent impaired, limited or restricted  31 %      Problem List Patient Active Problem List   Diagnosis Date Noted  . Anemia 06/14/2015  . Loss of weight 08/26/2013  . Hyperlipidemia 02/08/2012  . Osteoporosis 08/10/2011  . Chronic pain 01/27/2011  . Mental retardation 01/27/2011  . Hypothyroid 01/26/2011   LVoncille Lo PT 028-Feb-20171:13 PM Phone: [phone]: [facility] [address] [place] [address]Phone: [phone]  Fax:  [phone] Name: Shawna SpiritoMRN: [id number] of Birth: 206-Feb-1957 PHYSICAL THERAPY DISCHARGE SUMMARY  Visits from Start of Care: 10  Current functional level related to goals / functional outcomes: As above   Remaining deficits: Pt with limited  Right hip flexion which influences knee alignment,  Pt now uses [employer]taping for patellar subluxation and confidence with exercises   Education / Equipment: HEP and McConnell taping Plan: Patient agrees to discharge.  Patient goals were met. Patient is being discharged due to meeting the stated rehab goals.  ????? and being pleased with current functional status           LVoncille Lo PT 02017-02-281:13 PM Phone: [phone]: [phone]

## 2015-08-17 NOTE — Patient Instructions (Signed)
Pt given handout with specific information on how to administer Cherly Anderson tape on Right knee with practice x 4  Tapings to insure correct technique.    Pt sister also instructed as well as pt .  Handout given to sister of pt.  Voncille Lo, PT 08/17/2015 12:55 PM Phone: [phone] Fax: [phone]

## 2015-08-30 ENCOUNTER — Other Ambulatory Visit: Payer: Self-pay | Admitting: Family Medicine

## 2015-08-30 NOTE — Telephone Encounter (Signed)
Refill appropriate and filled per protocol. 

## 2015-09-03 ENCOUNTER — Other Ambulatory Visit: Payer: Self-pay | Admitting: Family Medicine

## 2015-09-03 ENCOUNTER — Encounter: Payer: Self-pay | Admitting: Family Medicine

## 2015-09-03 ENCOUNTER — Ambulatory Visit (INDEPENDENT_AMBULATORY_CARE_PROVIDER_SITE_OTHER): Payer: Medicare Other | Admitting: Family Medicine

## 2015-09-03 VITALS — BP 100/60 | HR 70 | Temp 97.9°F | Resp 18 | Wt 109.0 lb

## 2015-09-03 DIAGNOSIS — G8929 Other chronic pain: Secondary | ICD-10-CM

## 2015-09-03 DIAGNOSIS — E785 Hyperlipidemia, unspecified: Secondary | ICD-10-CM

## 2015-09-03 DIAGNOSIS — N183 Chronic kidney disease, stage 3 unspecified: Secondary | ICD-10-CM

## 2015-09-03 DIAGNOSIS — E038 Other specified hypothyroidism: Secondary | ICD-10-CM | POA: Diagnosis not present

## 2015-09-03 DIAGNOSIS — F79 Unspecified intellectual disabilities: Secondary | ICD-10-CM | POA: Diagnosis not present

## 2015-09-03 DIAGNOSIS — N184 Chronic kidney disease, stage 4 (severe): Secondary | ICD-10-CM | POA: Insufficient documentation

## 2015-09-03 LAB — CBC WITH DIFFERENTIAL/PLATELET
BASOS ABS: 0 10*3/uL (ref 0.0–0.1)
Basophils Relative: 0 % (ref 0–1)
EOS PCT: 1 % (ref 0–5)
Eosinophils Absolute: 0.1 10*3/uL (ref 0.0–0.7)
HCT: 29.3 % — ABNORMAL LOW (ref 36.0–46.0)
Hemoglobin: 9.8 g/dL — ABNORMAL LOW (ref 12.0–15.0)
LYMPHS ABS: 1.4 10*3/uL (ref 0.7–4.0)
Lymphocytes Relative: 27 % (ref 12–46)
MCH: 30.7 pg (ref 26.0–34.0)
MCHC: 33.4 g/dL (ref 30.0–36.0)
MCV: 91.8 fL (ref 78.0–100.0)
MPV: 9.2 fL (ref 8.6–12.4)
Monocytes Absolute: 0.5 10*3/uL (ref 0.1–1.0)
Monocytes Relative: 9 % (ref 3–12)
Neutro Abs: 3.3 10*3/uL (ref 1.7–7.7)
Neutrophils Relative %: 63 % (ref 43–77)
PLATELETS: 260 10*3/uL (ref 150–400)
RBC: 3.19 MIL/uL — AB (ref 3.87–5.11)
RDW: 13.1 % (ref 11.5–15.5)
WBC: 5.2 10*3/uL (ref 4.0–10.5)

## 2015-09-03 LAB — COMPLETE METABOLIC PANEL WITH GFR
ALBUMIN: 4.4 g/dL (ref 3.6–5.1)
ALT: 16 U/L (ref 6–29)
AST: 31 U/L (ref 10–35)
Alkaline Phosphatase: 65 U/L (ref 33–130)
BUN: 25 mg/dL (ref 7–25)
CHLORIDE: 101 mmol/L (ref 98–110)
CO2: 22 mmol/L (ref 20–31)
Calcium: 9.4 mg/dL (ref 8.6–10.4)
Creat: 1.64 mg/dL — ABNORMAL HIGH (ref 0.50–1.05)
GFR, Est African American: 39 mL/min — ABNORMAL LOW (ref >=60)
GFR, Est Non African American: 34 mL/min — ABNORMAL LOW (ref >=60)
GLUCOSE: 67 mg/dL — AB (ref 70–99)
POTASSIUM: 5.4 mmol/L — AB (ref 3.5–5.3)
SODIUM: 134 mmol/L — AB (ref 135–146)
Total Bilirubin: 0.5 mg/dL (ref 0.2–1.2)
Total Protein: 7 g/dL (ref 6.1–8.1)

## 2015-09-03 LAB — TSH: TSH: 5.26 m[IU]/L — AB

## 2015-09-03 LAB — LIPID PANEL
Cholesterol: 173 mg/dL (ref 125–200)
HDL: 64 mg/dL (ref >=46)
LDL Cholesterol: 93 mg/dL (ref <130)
Total CHOL/HDL Ratio: 2.7 Ratio (ref <=5.0)
Triglycerides: 82 mg/dL (ref <150)
VLDL: 16 mg/dL (ref <30)

## 2015-09-03 LAB — T4, FREE: FREE T4: 1.1 ng/dL (ref 0.8–1.8)

## 2015-09-03 LAB — T3, FREE: T3 FREE: 2.4 pg/mL (ref 2.3–4.2)

## 2015-09-03 MED ORDER — TRAMADOL HCL 50 MG PO TABS: ORAL_TABLET | ORAL | Status: DC

## 2015-09-03 MED ORDER — OXYCODONE HCL 5 MG PO TABS: 2.5000 mg | ORAL_TABLET | Freq: Every evening | ORAL | Status: DC | PRN

## 2015-09-03 NOTE — Progress Notes (Signed)
Patient ID: Shawna [name], female   DOB: [birth year], 60 y.o.   MRN: [id number]    Subjective:    Patient ID: Shawna [name], female    DOB: Oct 15, 1955, 60 y.o.   MRN: [id number]  Patient presents for Follow-up  patient to follow-up chronic medical problems. She's here today with her sister who is her guardian and power of attorney. She has history of mental retardation. She is being treated for hypothyroidism she is currently on Synthroid. She also has high cholesterol and is on TriCor. She is also maintained on pain medication secondary to severe arthritis as well as multiple hip surgeries with degeneration. She has chronic kidney disease her last creatinine was 1.40 which was an improvement. She is due for repeat labs today  Since her last visit she was treated by orthopedics because of knee injury where her patella popped out of place. She was given physical therapy and now has home exercises. She is back to her baseline. Her initial injury occurred when she tripped over the dog's going to feed her chickens.   Review Of Systems:  GEN- denies fatigue, fever, weight loss,weakness, recent illness HEENT- denies eye drainage, change in vision, nasal discharge, CVS- denies chest pain, palpitations RESP- denies SOB, cough, wheeze ABD- denies N/V, change in stools, abd pain GU- denies dysuria, hematuria, dribbling, incontinence MSK- denies joint pain, muscle aches, injury Neuro- denies headache, dizziness, syncope, seizure activity       Objective:    BP 100/60 mmHg  Pulse 70  Temp(Src) 97.9 F (36.6 C) (Oral)  Resp 18  Wt 109 lb (49.442 kg) GEN- NAD, alert and oriented x3 HEENT- PERRL, EOMI, non injected sclera, pink conjunctiva, MMM, oropharynx clear Neck- Supple, no thyromegaly CVS- RRR, no murmur RESP-CTAB EXT- No edema Pulses- Radial - 2+        Assessment & Plan:      Problem List Items Addressed This Visit    Mental retardation    Unchanged, has caregiver, sister who does  excellent job caring for her      Hypothyroid    Weight is back up, hopefully TFT are stable now      Relevant Orders   TSH   T3, free   T4, free   Hyperlipidemia   Relevant Orders   Lipid panel   CKD (chronic kidney disease), stage III - Primary    Recheck renal function      Relevant Orders   CBC with Differential/Platelet   COMPLETE METABOLIC PANEL WITH GFR   Chronic pain    Pain medication refilled       Relevant Medications   oxyCODONE (OXY IR/ROXICODONE) 5 MG immediate release tablet   traMADol (ULTRAM) 50 MG tablet      Note: This dictation was prepared with Dragon dictation along with smaller phrase technology. Any transcriptional errors that result from this process are unintentional.

## 2015-09-03 NOTE — Assessment & Plan Note (Signed)
Recheck renal function. ?

## 2015-09-03 NOTE — Assessment & Plan Note (Signed)
Unchanged, has caregiver, sister who does excellent job caring for her

## 2015-09-03 NOTE — Assessment & Plan Note (Signed)
Pain medication refilled

## 2015-09-03 NOTE — Patient Instructions (Signed)
Continue current medications F/U Sept for Physical Exam

## 2015-09-03 NOTE — Assessment & Plan Note (Signed)
Weight is back up, hopefully TFT are stable now

## 2015-09-08 ENCOUNTER — Other Ambulatory Visit: Payer: Self-pay | Admitting: *Deleted

## 2015-09-08 DIAGNOSIS — E039 Hypothyroidism, unspecified: Secondary | ICD-10-CM

## 2015-09-08 DIAGNOSIS — E875 Hyperkalemia: Secondary | ICD-10-CM

## 2015-09-13 ENCOUNTER — Other Ambulatory Visit: Payer: Medicare Other

## 2015-09-13 DIAGNOSIS — E875 Hyperkalemia: Secondary | ICD-10-CM | POA: Diagnosis not present

## 2015-09-14 LAB — BASIC METABOLIC PANEL
BUN: 36 mg/dL — ABNORMAL HIGH (ref 7–25)
CO2: 27 mmol/L (ref 20–31)
Calcium: 9 mg/dL (ref 8.6–10.4)
Chloride: 99 mmol/L (ref 98–110)
Creat: 1.62 mg/dL — ABNORMAL HIGH (ref 0.50–1.05)
Glucose, Bld: 114 mg/dL — ABNORMAL HIGH (ref 70–99)
POTASSIUM: 4.2 mmol/L (ref 3.5–5.3)
SODIUM: 133 mmol/L — AB (ref 135–146)

## 2015-10-17 IMAGING — CR DG LUMBAR SPINE COMPLETE 4+V
5 series · 5 of 5 positions shown · non-contrast
Comparison: None.

CLINICAL DATA: Pain with bilateral radicular symptoms. Fall 2 weeks
prior

EXAM:
LUMBAR SPINE - COMPLETE 4+ VIEW

[t l-spine a.p.]
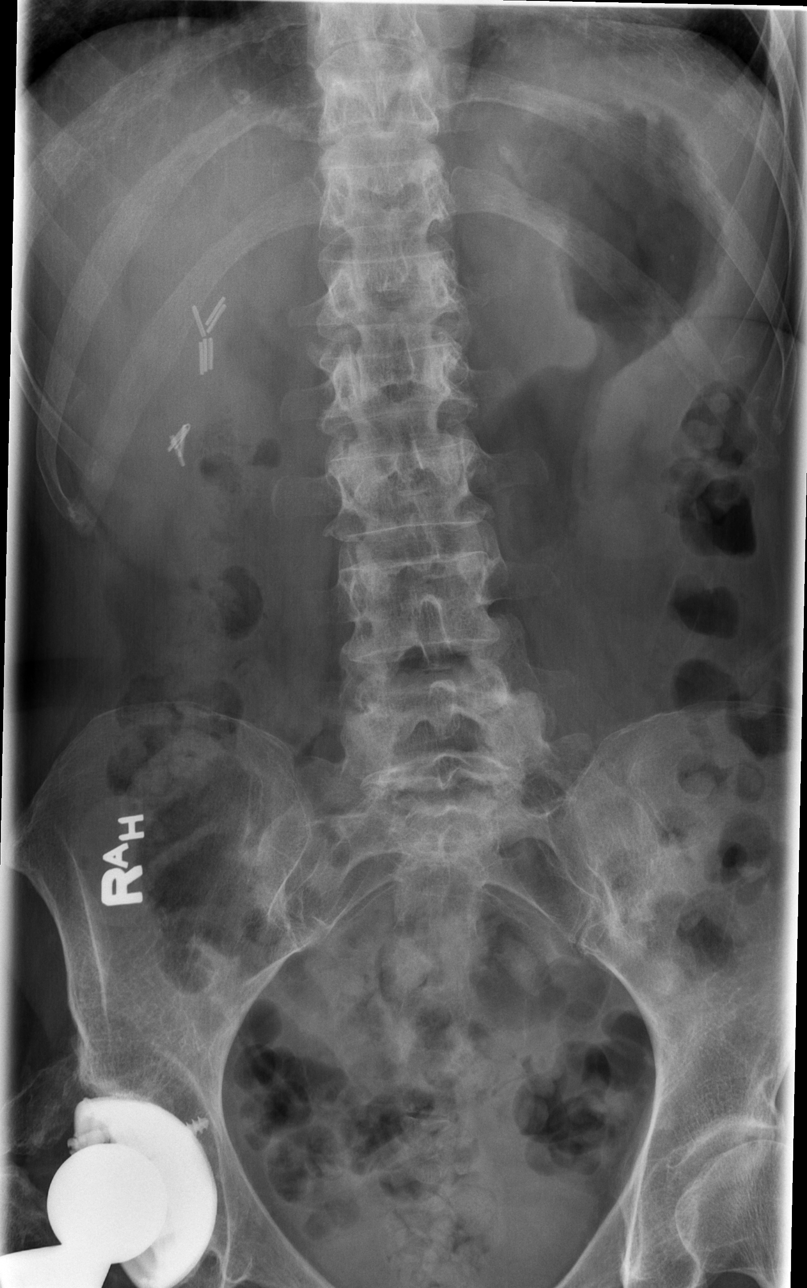

[t l-spine oblique exposure (1 of 2)]
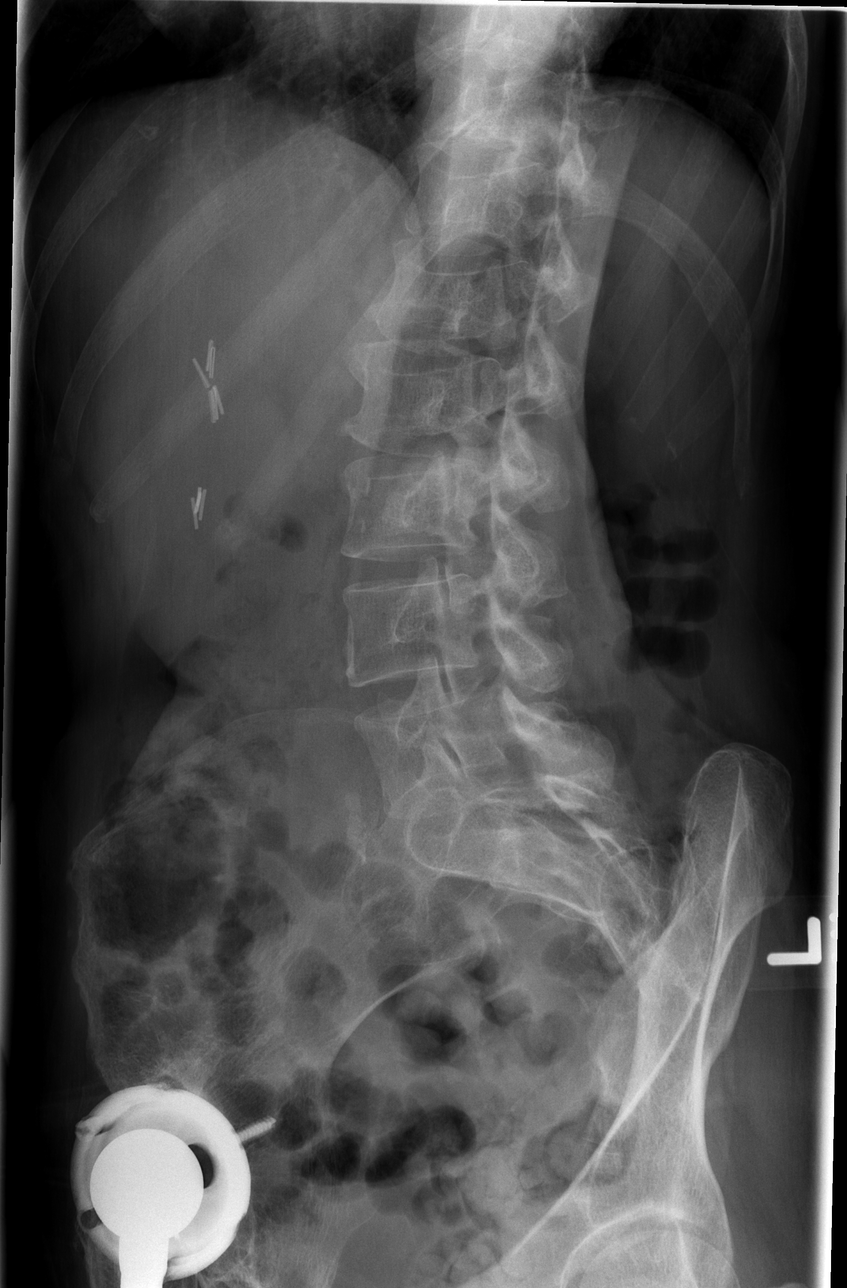

[t l-spine oblique exposure (2 of 2)]
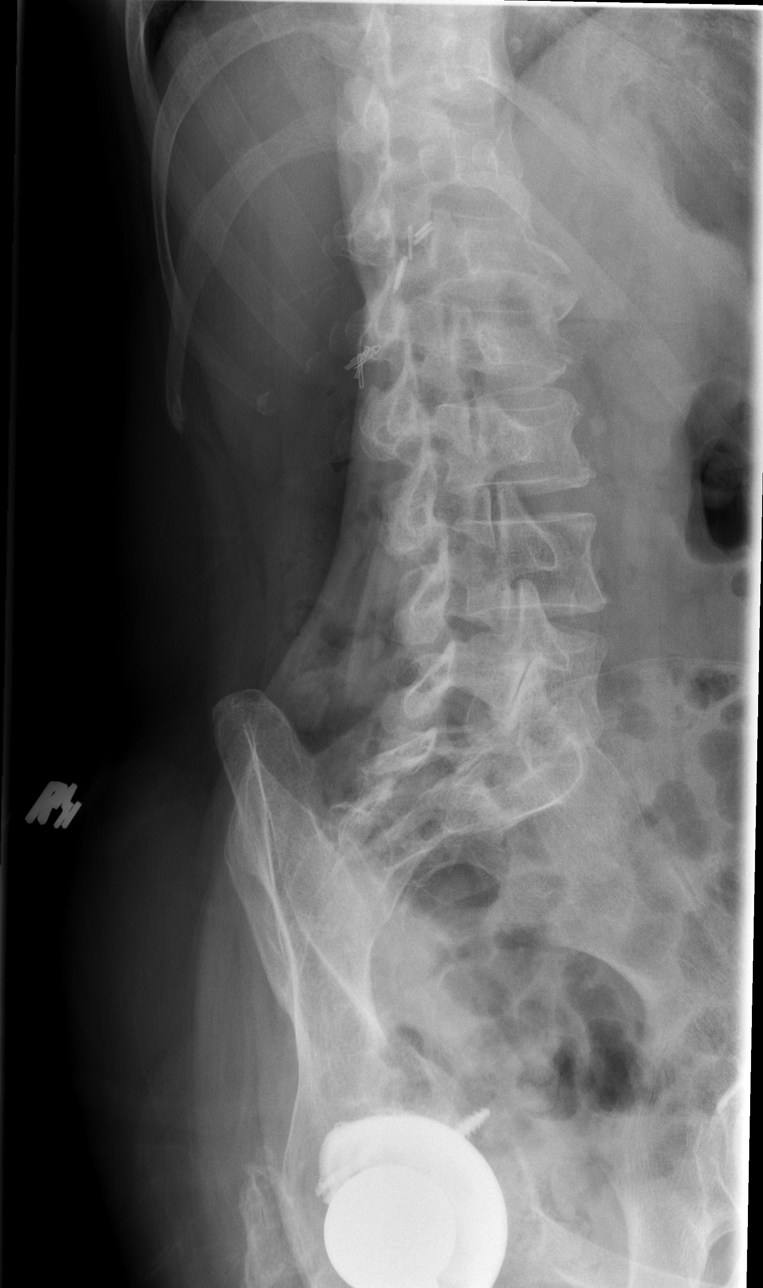

[t l-spine lat]
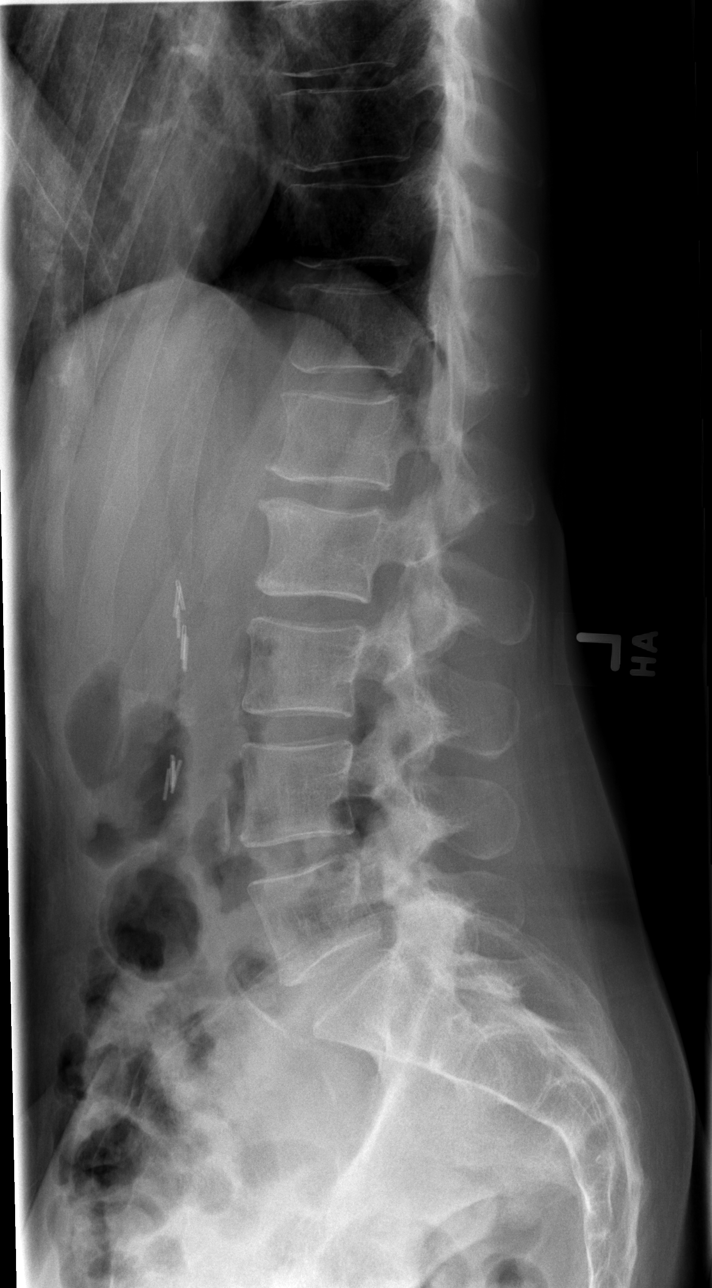

[t l-spine l5-s1 spot]
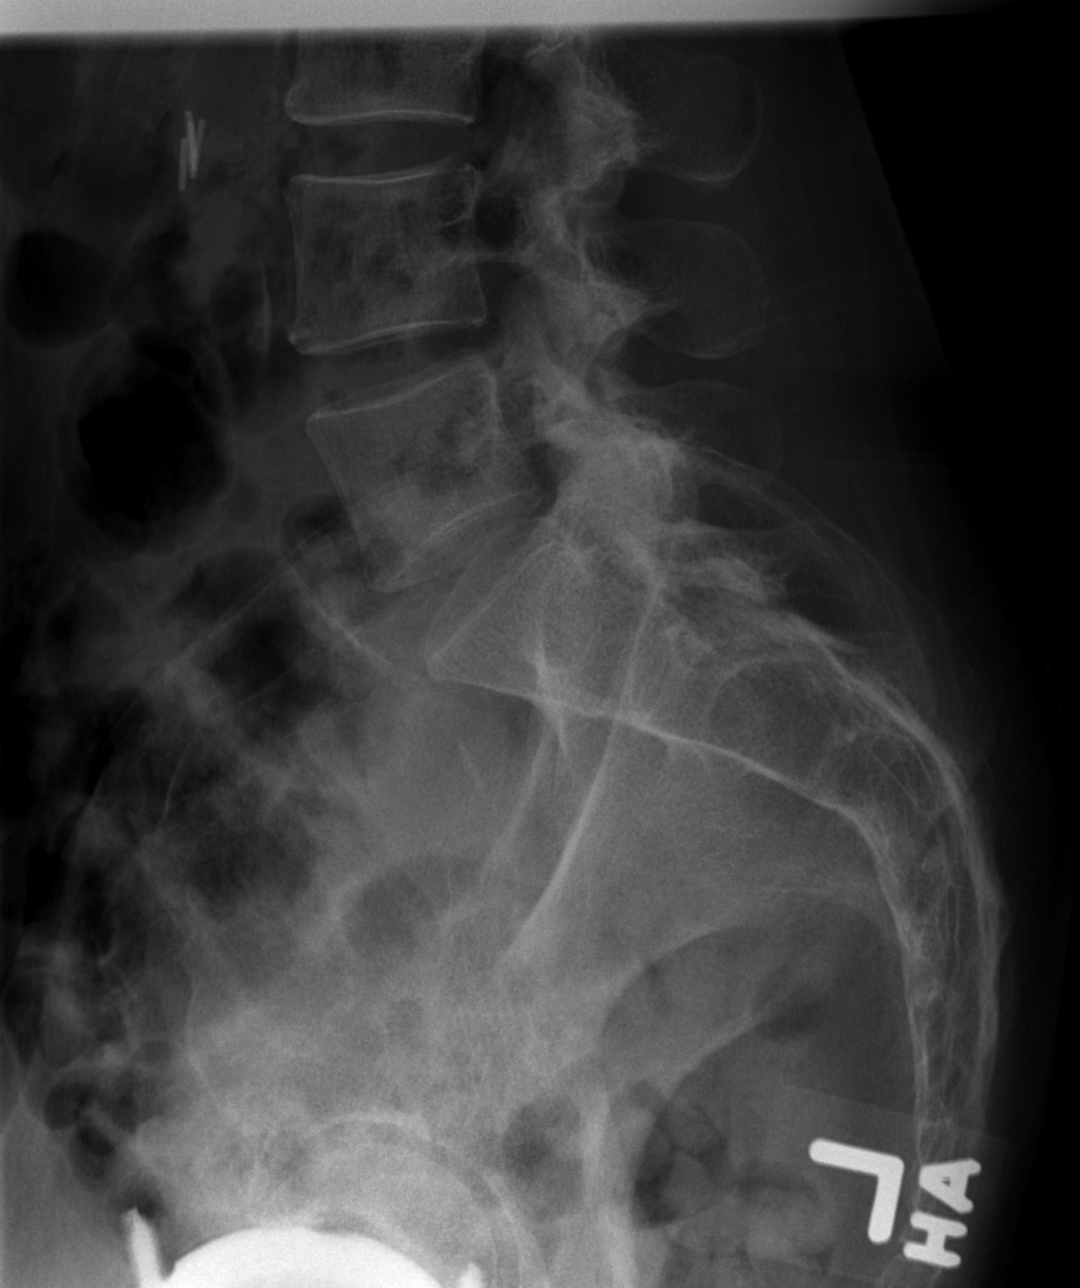

[5 of 5 positions shown; findings below may reference images not displayed]

FINDINGS: Frontal, lateral, spot lumbosacral lateral, and bilateral oblique
views were obtained. Patient is status post total hip replacement on
the right. There are 5 non rib-bearing lumbar type vertebral bodies.
There is no fracture or spondylolisthesis. Disc spaces appear
intact. There is facet osteoarthritic change at L5-S1 on the left.
There are surgical clips in the right abdomen.
IMPRESSION: Facet osteoarthritic change on the left at L5-S1. No fracture or
spondylolisthesis.

## 2015-11-03 ENCOUNTER — Other Ambulatory Visit: Payer: Medicare Other

## 2015-11-03 DIAGNOSIS — E039 Hypothyroidism, unspecified: Secondary | ICD-10-CM | POA: Diagnosis not present

## 2015-11-04 LAB — TSH: TSH: 4.26 m[IU]/L

## 2015-11-04 LAB — T3, FREE: T3 FREE: 2.1 pg/mL — AB (ref 2.3–4.2)

## 2015-11-04 LAB — T4, FREE: FREE T4: 1 ng/dL (ref 0.8–1.8)

## 2015-12-27 ENCOUNTER — Other Ambulatory Visit: Payer: Self-pay | Admitting: Family Medicine

## 2015-12-27 NOTE — Telephone Encounter (Signed)
Refill appropriate and filled per protocol. 

## 2016-03-06 ENCOUNTER — Ambulatory Visit (INDEPENDENT_AMBULATORY_CARE_PROVIDER_SITE_OTHER): Payer: Medicare Other | Admitting: Family Medicine

## 2016-03-06 ENCOUNTER — Encounter: Payer: Self-pay | Admitting: Family Medicine

## 2016-03-06 VITALS — BP 114/68 | HR 68 | Temp 98.9°F | Resp 14 | Ht 64.0 in | Wt 111.0 lb

## 2016-03-06 DIAGNOSIS — Z0000 Encounter for general adult medical examination without abnormal findings: Secondary | ICD-10-CM

## 2016-03-06 DIAGNOSIS — D649 Anemia, unspecified: Secondary | ICD-10-CM

## 2016-03-06 DIAGNOSIS — E038 Other specified hypothyroidism: Secondary | ICD-10-CM

## 2016-03-06 DIAGNOSIS — N183 Chronic kidney disease, stage 3 unspecified: Secondary | ICD-10-CM

## 2016-03-06 DIAGNOSIS — E785 Hyperlipidemia, unspecified: Secondary | ICD-10-CM | POA: Diagnosis not present

## 2016-03-06 DIAGNOSIS — G8929 Other chronic pain: Secondary | ICD-10-CM | POA: Diagnosis not present

## 2016-03-06 DIAGNOSIS — Z23 Encounter for immunization: Secondary | ICD-10-CM | POA: Diagnosis not present

## 2016-03-06 LAB — CBC WITH DIFFERENTIAL/PLATELET
BASOS ABS: 0 {cells}/uL (ref 0–200)
Basophils Relative: 0 %
EOS PCT: 2 %
Eosinophils Absolute: 96 cells/uL (ref 15–500)
HCT: 26.6 % — ABNORMAL LOW (ref 35.0–45.0)
Hemoglobin: 9.2 g/dL — ABNORMAL LOW (ref 12.0–15.0)
LYMPHS PCT: 29 %
Lymphs Abs: 1392 cells/uL (ref 850–3900)
MCH: 31.2 pg (ref 27.0–33.0)
MCHC: 34.6 g/dL (ref 32.0–36.0)
MCV: 90.2 fL (ref 80.0–100.0)
MONOS PCT: 10 %
MPV: 9 fL (ref 7.5–12.5)
Monocytes Absolute: 480 cells/uL (ref 200–950)
NEUTROS PCT: 59 %
Neutro Abs: 2832 cells/uL (ref 1500–7800)
Platelets: 208 10*3/uL (ref 140–400)
RBC: 2.95 MIL/uL — ABNORMAL LOW (ref 3.80–5.10)
RDW: 13.3 % (ref 11.0–15.0)
WBC: 4.8 10*3/uL (ref 3.8–10.8)

## 2016-03-06 LAB — COMPLETE METABOLIC PANEL WITH GFR
ALBUMIN: 4.4 g/dL (ref 3.6–5.1)
ALK PHOS: 54 U/L (ref 33–130)
ALT: 17 U/L (ref 6–29)
AST: 35 U/L (ref 10–35)
BUN: 33 mg/dL — AB (ref 7–25)
CALCIUM: 9.2 mg/dL (ref 8.6–10.4)
CO2: 24 mmol/L (ref 20–31)
CREATININE: 1.56 mg/dL — AB (ref 0.50–1.05)
Chloride: 101 mmol/L (ref 98–110)
GFR, EST AFRICAN AMERICAN: 42 mL/min — AB (ref >=60)
GFR, Est Non African American: 36 mL/min — ABNORMAL LOW (ref >=60)
Glucose, Bld: 79 mg/dL (ref 70–99)
POTASSIUM: 5 mmol/L (ref 3.5–5.3)
Sodium: 135 mmol/L (ref 135–146)
Total Bilirubin: 0.5 mg/dL (ref 0.2–1.2)
Total Protein: 6.7 g/dL (ref 6.1–8.1)

## 2016-03-06 MED ORDER — OXYCODONE HCL 5 MG PO TABS: 2.5000 mg | ORAL_TABLET | Freq: Every evening | ORAL | 0 refills | Status: DC | PRN

## 2016-03-06 NOTE — Progress Notes (Signed)
Subjective:   Patient presents for Medicare Annual/Subsequent preventive examination.    Pt here for wellness exam, medications reviewed  Due for repeat TFT and renal function for CKD  Follows yearly with nephrology  Has GYN  No concerns  Sister dipenses medication for pain   Review Past Medical/Family/Social: Per EMR    Risk Factors  Current exercise habits: walks Dietary issues discussed: none  Cardiac risk factors: CKD  Depression Screen  Neg  (Note: if answer to either of the following is "Yes", a more complete depression screening is indicated)  Over the past two weeks, have you felt down, depressed or hopeless? No Over the past two weeks, have you felt little interest or pleasure in doing things? No Have you lost interest or pleasure in daily life? No Do you often feel hopeless? No Do you cry easily over simple problems? No   Activities of Daily Living  In your present state of health, do you have any difficulty performing the following activities?:  Driving? No  Managing money? No  Feeding yourself? No  Getting from bed to chair? No  Climbing a flight of stairs? No  Preparing food and eating?: No  Bathing or showering? No  Getting dressed: No  Getting to the toilet? No  Using the toilet:No  Moving around from place to place: No  In the past year have you fallen or had a near fall?:Yes  Are you sexually active? No  Do you have more than one partner? No   Hearing Difficulties: No  Do you often ask people to speak up or repeat themselves? No  Do you experience ringing or noises in your ears? No Do you have difficulty understanding soft or whispered voices? No  Do you feel that you have a problem with memory? No Do you often misplace items? No  Do you feel safe at home? Yes  Cognitive Testing  Alert? Yes Normal Appearance?Yes  Oriented to person? Yes Place? Yes  Time? Yes  Recall of three objects? Yes  Can perform simple calculations? Yes  Displays  appropriate judgment?Yes  Can read the correct time from a watch face?Yes   List the Names of Other Physician/Practitioners you currently use: Nephrology  Screening Tests / Date Colonoscopy    UTD                 Zostavax - age 57 Mammogram - UTD  Influenza Vaccine - Due today  Tetanus/tdap- unable financial   ROS: GEN- denies fatigue, fever, weight loss,weakness, recent illness HEENT- denies eye drainage, change in vision, nasal discharge, CVS- denies chest pain, palpitations RESP- denies SOB, cough, wheeze ABD- denies N/V, change in stools, abd pain GU- denies dysuria, hematuria, dribbling, incontinence MSK- + joint pain, muscle aches, injury Neuro- denies headache, dizziness, syncope, seizure activity  Physical: GEN- NAD, alert and oriented x3 HEENT- PERRL, EOMI, non injected sclera, pink conjunctiva, MMM, oropharynx clear Neck- Supple, no thryomegaly CVS- RRR, no murmur RESP-CTAB ABD-NABS,soft,NT,ND  EXT- No edema Pulses- Radial, DP- 2+    Assessment:    Annual wellness medicare exam   Plan:    During the course of the visit the patient was educated and counseled about appropriate screening and preventive services including:  Flu shot given   Shingles vaccine age  60 Has HPOA - Sister   Diet review for nutrition referral? Yes ____ Not Indicated __x__  Patient Instructions (the written plan) was given to the patient.  Medicare Attestation  I have personally reviewed:  The patient's medical and social history  Their use of alcohol, tobacco or illicit drugs  Their current medications and supplements  The patient's functional ability including ADLs,fall risks, home safety risks, cognitive, and hearing and visual impairment  Diet and physical activities  Evidence for depression or mood disorders  The patient's weight, height, BMI, and visual acuity have been recorded in the chart. I have made referrals, counseling, and provided education to the patient based on  review of the above and I have provided the patient with a written personalized care plan for preventive services.

## 2016-03-06 NOTE — Assessment & Plan Note (Signed)
Check TFT Recheck renal function and chronic anemia for stability  Weight looks good Continue tricor for hyperlipidemia

## 2016-03-06 NOTE — Assessment & Plan Note (Signed)
Pain meds refilled , no abuse noted

## 2016-03-06 NOTE — Patient Instructions (Signed)
fLU SHOT  Labs to be done F/U 6 months

## 2016-03-07 LAB — T4, FREE: Free T4: 1.2 ng/dL (ref 0.8–1.8)

## 2016-03-07 LAB — T3, FREE: T3, Free: 2.7 pg/mL (ref 2.3–4.2)

## 2016-03-07 LAB — TSH: TSH: 4.1 mIU/L

## 2016-03-16 ENCOUNTER — Telehealth: Payer: Self-pay | Admitting: *Deleted

## 2016-03-16 NOTE — Telephone Encounter (Signed)
Patient sister Benjamine Mola called to get results of labs.   Made aware that all labs are stable.

## 2016-03-17 ENCOUNTER — Telehealth: Payer: Self-pay

## 2016-03-17 NOTE — Telephone Encounter (Signed)
Tried contacting pt sister several times left Vm to call bck pt sister never called bck. Lab results have been mailed to patient

## 2016-03-17 NOTE — Telephone Encounter (Signed)
-----   Message from Alycia Rossetti, MD sent at 03/07/2016  8:13 AM EDT ----- Call pt sister,her labs are stable, Creatinine at 1.5 , anemia stable at 9  No changes to meds

## 2016-03-30 ENCOUNTER — Telehealth: Payer: Self-pay | Admitting: *Deleted

## 2016-03-30 NOTE — Telephone Encounter (Signed)
Received fax requesting refill on Tramadol.   Ok to refill??  Last office visit 03/06/2016.  Last refill 09/03/2015, #1 refill.

## 2016-03-31 MED ORDER — TRAMADOL HCL 50 MG PO TABS: ORAL_TABLET | ORAL | 1 refills | Status: DC

## 2016-03-31 NOTE — Telephone Encounter (Signed)
okay

## 2016-03-31 NOTE — Telephone Encounter (Signed)
Medication called to pharmacy. 

## 2016-04-25 ENCOUNTER — Other Ambulatory Visit: Payer: Self-pay | Admitting: Family Medicine

## 2016-07-31 DIAGNOSIS — Z1231 Encounter for screening mammogram for malignant neoplasm of breast: Secondary | ICD-10-CM | POA: Diagnosis not present

## 2016-07-31 LAB — HM MAMMOGRAPHY

## 2016-08-03 ENCOUNTER — Encounter: Payer: Self-pay | Admitting: Family Medicine

## 2016-08-24 ENCOUNTER — Other Ambulatory Visit: Payer: Self-pay | Admitting: Family Medicine

## 2016-09-11 ENCOUNTER — Ambulatory Visit (INDEPENDENT_AMBULATORY_CARE_PROVIDER_SITE_OTHER): Payer: Medicare Other | Admitting: Family Medicine

## 2016-09-11 ENCOUNTER — Encounter: Payer: Self-pay | Admitting: Family Medicine

## 2016-09-11 VITALS — BP 108/62 | HR 86 | Temp 98.1°F | Resp 14 | Ht 64.0 in | Wt 110.0 lb

## 2016-09-11 DIAGNOSIS — E038 Other specified hypothyroidism: Secondary | ICD-10-CM

## 2016-09-11 DIAGNOSIS — N183 Chronic kidney disease, stage 3 unspecified: Secondary | ICD-10-CM

## 2016-09-11 DIAGNOSIS — F79 Unspecified intellectual disabilities: Secondary | ICD-10-CM | POA: Diagnosis not present

## 2016-09-11 DIAGNOSIS — G894 Chronic pain syndrome: Secondary | ICD-10-CM

## 2016-09-11 LAB — BASIC METABOLIC PANEL
BUN: 57 mg/dL — AB (ref 7–25)
CHLORIDE: 109 mmol/L (ref 98–110)
CO2: 20 mmol/L (ref 20–31)
Calcium: 8.9 mg/dL (ref 8.6–10.4)
Creat: 1.91 mg/dL — ABNORMAL HIGH (ref 0.50–0.99)
GLUCOSE: 70 mg/dL (ref 70–99)
POTASSIUM: 5 mmol/L (ref 3.5–5.3)
Sodium: 138 mmol/L (ref 135–146)

## 2016-09-11 LAB — CBC WITH DIFFERENTIAL/PLATELET
BASOS PCT: 1 %
Basophils Absolute: 33 cells/uL (ref 0–200)
EOS ABS: 66 {cells}/uL (ref 15–500)
Eosinophils Relative: 2 %
HEMATOCRIT: 25 % — AB (ref 35.0–45.0)
HEMOGLOBIN: 8.4 g/dL — AB (ref 12.0–15.0)
LYMPHS PCT: 43 %
Lymphs Abs: 1419 cells/uL (ref 850–3900)
MCH: 30.8 pg (ref 27.0–33.0)
MCHC: 33.6 g/dL (ref 32.0–36.0)
MCV: 91.6 fL (ref 80.0–100.0)
MONO ABS: 363 {cells}/uL (ref 200–950)
MPV: 9.1 fL (ref 7.5–12.5)
Monocytes Relative: 11 %
NEUTROS PCT: 43 %
Neutro Abs: 1419 cells/uL — ABNORMAL LOW (ref 1500–7800)
Platelets: 201 10*3/uL (ref 140–400)
RBC: 2.73 MIL/uL — ABNORMAL LOW (ref 3.80–5.10)
RDW: 14.1 % (ref 11.0–15.0)
WBC: 3.3 10*3/uL — ABNORMAL LOW (ref 3.8–10.8)

## 2016-09-11 LAB — TSH: TSH: 7.59 m[IU]/L — AB

## 2016-09-11 MED ORDER — OXYCODONE HCL 5 MG PO TABS: 2.5000 mg | ORAL_TABLET | Freq: Every evening | ORAL | 0 refills | Status: DC | PRN

## 2016-09-11 MED ORDER — TRAMADOL HCL 50 MG PO TABS: ORAL_TABLET | ORAL | 1 refills | Status: DC

## 2016-09-11 NOTE — Progress Notes (Signed)
   Subjective:    Patient ID: Shawna [name], female    DOB: 06-20-1956, 61 y.o.   MRN: [id number]  Patient presents for 6 month F/U (is not fasting)  Pt here to f/u chronic medical problems. NO concerns Today. They do request a refill on her chronic pain medication. She also had some recent dental work done as she chipped off a tooth.   Medications reviewed    CKD- baseline Cr 1.5Due for repeat renal function. Hypothyroidism- on thyroid replacement     Review Of Systems:  GEN- denies fatigue, fever, weight loss,weakness, recent illness HEENT- denies eye drainage, change in vision, nasal discharge, CVS- denies chest pain, palpitations RESP- denies SOB, cough, wheeze ABD- denies N/V, change in stools, abd pain GU- denies dysuria, hematuria, dribbling, incontinence MSK- denies joint pain, muscle aches, injury Neuro- denies headache, dizziness, syncope, seizure activity       Objective:    BP 108/62   Pulse 86   Temp 98.1 F (36.7 C) (Oral)   Resp 14   Ht 5\' 4"  (1.626 m)   Wt 110 lb (49.9 kg)   SpO2 98%   BMI 18.88 kg/m  GEN- NAD, alert and oriented x3 HEENT- PERRL, EOMI, non injected sclera, pink conjunctiva, MMM, oropharynx clear Neck- Supple, no thyromegaly CVS- RRR, no murmur RESP-CTAB EXT- No edema Pulses- Radial, DP- 2+        Assessment & Plan:      Problem List Items Addressed This Visit    Mental retardation   Hypothyroid - Primary    Check TFT       Relevant Orders   TSH   CKD (chronic kidney disease), stage III    Check RENAL FUNCTION, avoiding NSAIDs her weight is staying stable and she is keeping hydrated.      Relevant Orders   Basic metabolic panel   CBC with Differential/Platelet   Chronic pain    Sister dispenses her medications as needed for her chronic pain. She has deterioration in her hips.      Relevant Medications   oxyCODONE (OXY IR/ROXICODONE) 5 MG immediate release tablet   traMADol (ULTRAM) 50 MG tablet      Note: This  dictation was prepared with Dragon dictation along with smaller phrase technology. Any transcriptional errors that result from this process are unintentional.

## 2016-09-11 NOTE — Assessment & Plan Note (Signed)
Check RENAL FUNCTION, avoiding NSAIDs her weight is staying stable and she is keeping hydrated.

## 2016-09-11 NOTE — Patient Instructions (Signed)
F/U 6 months For Physical

## 2016-09-11 NOTE — Assessment & Plan Note (Signed)
Check TFT  

## 2016-09-11 NOTE — Assessment & Plan Note (Signed)
Sister dispenses her medications as needed for her chronic pain. She has deterioration in her hips.

## 2016-09-19 ENCOUNTER — Other Ambulatory Visit: Payer: Self-pay | Admitting: *Deleted

## 2016-09-19 DIAGNOSIS — D649 Anemia, unspecified: Secondary | ICD-10-CM

## 2016-09-19 MED ORDER — POLYSACCHARIDE IRON COMPLEX 150 MG PO CAPS: 150.0000 mg | ORAL_CAPSULE | Freq: Every day | ORAL | Status: DC

## 2016-09-19 MED ORDER — LEVOTHYROXINE SODIUM 75 MCG PO TABS: 75.0000 ug | ORAL_TABLET | Freq: Every day | ORAL | 3 refills | Status: DC

## 2016-10-04 ENCOUNTER — Other Ambulatory Visit: Payer: Medicare Other

## 2016-10-04 DIAGNOSIS — D649 Anemia, unspecified: Secondary | ICD-10-CM | POA: Diagnosis not present

## 2016-10-05 LAB — FECAL OCCULT BLOOD, IMMUNOCHEMICAL
FECAL OCCULT BLOOD: NEGATIVE
Fecal Occult Blood: NEGATIVE
Fecal Occult Blood: NEGATIVE

## 2016-10-06 ENCOUNTER — Other Ambulatory Visit: Payer: Self-pay | Admitting: *Deleted

## 2016-10-06 DIAGNOSIS — E038 Other specified hypothyroidism: Secondary | ICD-10-CM

## 2016-10-06 DIAGNOSIS — D509 Iron deficiency anemia, unspecified: Secondary | ICD-10-CM

## 2016-10-06 MED ORDER — POLYSACCHARIDE IRON COMPLEX 150 MG PO CAPS: 150.0000 mg | ORAL_CAPSULE | Freq: Every day | ORAL | Status: DC

## 2016-11-02 ENCOUNTER — Other Ambulatory Visit: Payer: Self-pay

## 2016-11-08 ENCOUNTER — Other Ambulatory Visit: Payer: Self-pay | Admitting: Family Medicine

## 2016-11-08 ENCOUNTER — Other Ambulatory Visit: Payer: Medicare Other

## 2016-11-08 DIAGNOSIS — N183 Chronic kidney disease, stage 3 unspecified: Secondary | ICD-10-CM

## 2016-11-08 DIAGNOSIS — D509 Iron deficiency anemia, unspecified: Secondary | ICD-10-CM | POA: Diagnosis not present

## 2016-11-08 DIAGNOSIS — E038 Other specified hypothyroidism: Secondary | ICD-10-CM

## 2016-11-08 LAB — COMPREHENSIVE METABOLIC PANEL
ALT: 20 U/L (ref 6–29)
AST: 35 U/L (ref 10–35)
Albumin: 4.3 g/dL (ref 3.6–5.1)
Alkaline Phosphatase: 64 U/L (ref 33–130)
BILIRUBIN TOTAL: 0.6 mg/dL (ref 0.2–1.2)
BUN: 30 mg/dL — AB (ref 7–25)
CHLORIDE: 98 mmol/L (ref 98–110)
CO2: 23 mmol/L (ref 20–31)
Calcium: 9.5 mg/dL (ref 8.6–10.4)
Creat: 1.98 mg/dL — ABNORMAL HIGH (ref 0.50–0.99)
Glucose, Bld: 66 mg/dL — ABNORMAL LOW (ref 70–99)
Potassium: 5.3 mmol/L (ref 3.5–5.3)
Sodium: 132 mmol/L — ABNORMAL LOW (ref 135–146)
TOTAL PROTEIN: 6.8 g/dL (ref 6.1–8.1)

## 2016-11-08 LAB — IRON AND TIBC
%SAT: 27 % (ref 11–50)
Iron: 128 ug/dL (ref 45–160)
TIBC: 471 ug/dL — ABNORMAL HIGH (ref 250–450)
UIBC: 343 ug/dL

## 2016-11-08 LAB — TSH: TSH: 0.87 mIU/L

## 2016-11-08 LAB — T4, FREE: FREE T4: 1.2 ng/dL (ref 0.8–1.8)

## 2016-11-08 LAB — T3, FREE: T3, Free: 2.3 pg/mL (ref 2.3–4.2)

## 2016-11-09 LAB — CBC WITH DIFFERENTIAL/PLATELET
BASOS ABS: 0 {cells}/uL (ref 0–200)
Basophils Relative: 0 %
EOS ABS: 34 {cells}/uL (ref 15–500)
EOS PCT: 1 %
HCT: 26.3 % — ABNORMAL LOW (ref 35.0–45.0)
Hemoglobin: 8.9 g/dL — CL (ref 12.0–15.0)
LYMPHS PCT: 38 %
Lymphs Abs: 1292 cells/uL (ref 850–3900)
MCH: 30.8 pg (ref 27.0–33.0)
MCHC: 33.8 g/dL (ref 32.0–36.0)
MCV: 91 fL (ref 80.0–100.0)
MONOS PCT: 10 %
MPV: 9.1 fL (ref 7.5–12.5)
Monocytes Absolute: 340 cells/uL (ref 200–950)
Neutro Abs: 1734 cells/uL (ref 1500–7800)
Neutrophils Relative %: 51 %
PLATELETS: 205 10*3/uL (ref 140–400)
RBC: 2.89 MIL/uL — ABNORMAL LOW (ref 3.80–5.10)
RDW: 13.9 % (ref 11.0–15.0)
WBC: 3.4 10*3/uL — ABNORMAL LOW (ref 3.8–10.8)

## 2016-11-10 ENCOUNTER — Other Ambulatory Visit: Payer: Self-pay | Admitting: *Deleted

## 2016-11-10 DIAGNOSIS — D649 Anemia, unspecified: Secondary | ICD-10-CM

## 2016-11-10 DIAGNOSIS — N183 Chronic kidney disease, stage 3 unspecified: Secondary | ICD-10-CM

## 2016-12-18 ENCOUNTER — Other Ambulatory Visit: Payer: Self-pay | Admitting: Family Medicine

## 2017-01-15 ENCOUNTER — Other Ambulatory Visit: Payer: Self-pay | Admitting: Family Medicine

## 2017-03-01 DIAGNOSIS — D509 Iron deficiency anemia, unspecified: Secondary | ICD-10-CM | POA: Diagnosis not present

## 2017-03-01 DIAGNOSIS — G8929 Other chronic pain: Secondary | ICD-10-CM | POA: Diagnosis not present

## 2017-03-01 DIAGNOSIS — E785 Hyperlipidemia, unspecified: Secondary | ICD-10-CM | POA: Diagnosis not present

## 2017-03-01 DIAGNOSIS — N179 Acute kidney failure, unspecified: Secondary | ICD-10-CM | POA: Diagnosis not present

## 2017-03-01 DIAGNOSIS — I959 Hypotension, unspecified: Secondary | ICD-10-CM | POA: Diagnosis not present

## 2017-03-19 ENCOUNTER — Encounter: Payer: Self-pay | Admitting: Family Medicine

## 2017-03-19 ENCOUNTER — Ambulatory Visit (INDEPENDENT_AMBULATORY_CARE_PROVIDER_SITE_OTHER): Payer: Medicare Other | Admitting: Family Medicine

## 2017-03-19 VITALS — BP 100/60 | HR 88 | Temp 97.7°F | Resp 18 | Wt 107.6 lb

## 2017-03-19 DIAGNOSIS — N184 Chronic kidney disease, stage 4 (severe): Secondary | ICD-10-CM | POA: Diagnosis not present

## 2017-03-19 DIAGNOSIS — Z23 Encounter for immunization: Secondary | ICD-10-CM | POA: Diagnosis not present

## 2017-03-19 DIAGNOSIS — E038 Other specified hypothyroidism: Secondary | ICD-10-CM | POA: Diagnosis not present

## 2017-03-19 DIAGNOSIS — G894 Chronic pain syndrome: Secondary | ICD-10-CM

## 2017-03-19 DIAGNOSIS — D631 Anemia in chronic kidney disease: Secondary | ICD-10-CM | POA: Diagnosis not present

## 2017-03-19 DIAGNOSIS — E782 Mixed hyperlipidemia: Secondary | ICD-10-CM | POA: Diagnosis not present

## 2017-03-19 LAB — LIPID PANEL
CHOLESTEROL: 145 mg/dL (ref <200)
HDL: 49 mg/dL — AB (ref >50)
LDL CHOLESTEROL (CALC): 79 mg/dL
Non-HDL Cholesterol (Calc): 96 mg/dL (calc) (ref <130)
TRIGLYCERIDES: 83 mg/dL (ref <150)
Total CHOL/HDL Ratio: 3 (calc) (ref <5.0)

## 2017-03-19 LAB — COMPREHENSIVE METABOLIC PANEL
AG RATIO: 1.8 (calc) (ref 1.0–2.5)
ALKALINE PHOSPHATASE (APISO): 75 U/L (ref 33–130)
ALT: 15 U/L (ref 6–29)
AST: 26 U/L (ref 10–35)
Albumin: 4.2 g/dL (ref 3.6–5.1)
BILIRUBIN TOTAL: 0.5 mg/dL (ref 0.2–1.2)
BUN/Creatinine Ratio: 20 (calc) (ref 6–22)
BUN: 33 mg/dL — AB (ref 7–25)
CALCIUM: 9.2 mg/dL (ref 8.6–10.4)
CO2: 20 mmol/L (ref 20–32)
Chloride: 104 mmol/L (ref 98–110)
Creat: 1.61 mg/dL — ABNORMAL HIGH (ref 0.50–0.99)
Globulin: 2.3 g/dL (calc) (ref 1.9–3.7)
Glucose, Bld: 76 mg/dL (ref 65–99)
Potassium: 5.1 mmol/L (ref 3.5–5.3)
Sodium: 137 mmol/L (ref 135–146)
TOTAL PROTEIN: 6.5 g/dL (ref 6.1–8.1)

## 2017-03-19 LAB — CBC WITH DIFFERENTIAL/PLATELET
BASOS ABS: 22 {cells}/uL (ref 0–200)
Basophils Relative: 0.5 %
EOS ABS: 48 {cells}/uL (ref 15–500)
EOS PCT: 1.1 %
HEMATOCRIT: 26.3 % — AB (ref 35.0–45.0)
HEMOGLOBIN: 8.9 g/dL — AB (ref 11.7–15.5)
LYMPHS ABS: 1778 {cells}/uL (ref 850–3900)
MCH: 31.4 pg (ref 27.0–33.0)
MCHC: 33.8 g/dL (ref 32.0–36.0)
MCV: 92.9 fL (ref 80.0–100.0)
MPV: 9.9 fL (ref 7.5–12.5)
Monocytes Relative: 9.8 %
NEUTROS ABS: 2121 {cells}/uL (ref 1500–7800)
NEUTROS PCT: 48.2 %
Platelets: 212 10*3/uL (ref 140–400)
RBC: 2.83 10*6/uL — ABNORMAL LOW (ref 3.80–5.10)
RDW: 11.8 % (ref 11.0–15.0)
Total Lymphocyte: 40.4 %
WBC mixed population: 431 cells/uL (ref 200–950)
WBC: 4.4 10*3/uL (ref 3.8–10.8)

## 2017-03-19 LAB — IRON,TIBC AND FERRITIN PANEL
%SAT: 26 % (calc) (ref 11–50)
FERRITIN: 301 ng/mL — AB (ref 20–288)
IRON: 113 ug/dL (ref 45–160)
TIBC: 437 mcg/dL (calc) (ref 250–450)

## 2017-03-19 MED ORDER — OXYCODONE HCL 5 MG PO TABS: 2.5000 mg | ORAL_TABLET | Freq: Every evening | ORAL | 0 refills | Status: DC | PRN

## 2017-03-19 NOTE — Patient Instructions (Addendum)
Liquid iron - Geritol brand  F/u 6 MONTHS physical

## 2017-03-19 NOTE — Progress Notes (Signed)
   Subjective:    Patient ID: Shawna [name], female    DOB: 1955/07/08, 61 y.o.   MRN: [id number]  Patient presents for 6 mth follow up (is fasting,  issues with iron)  Patient follow-up chronic medical problems. She is here with her sister who is her caregiver. Medications reviewed Chronic kidney disease which worsened on her last check. She is following with nephrology. 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  Hypertriglyceridemia she is on TriCor  Chronic anemia in the setting of her chronic kidney disease stage IV she is on iron supplement her hemoglobin is typically around 9. Reviewed nephrology note from Sept She chews all her meds, sister concerned iron is staining her teeth., then stated she had some diarrhea last week, so they stopped the iron and diarrhea went away??  Flu shot   Review Of Systems:  GEN- denies fatigue, fever, weight loss,weakness, recent illness HEENT- denies eye drainage, change in vision, nasal discharge, CVS- denies chest pain, palpitations RESP- denies SOB, cough, wheeze ABD- denies N/V, change in stools, abd pain GU- denies dysuria, hematuria, dribbling, incontinence MSK- denies joint pain, muscle aches, injury Neuro- denies headache, dizziness, syncope, seizure activity       Objective:    BP 100/60 (BP Location: Right Arm, Patient Position: Sitting, Cuff Size: Normal)   Pulse 88   Temp 97.7 F (36.5 C) (Oral)   Resp 18   Wt 107 lb 9.6 oz (48.8 kg)   BMI 18.47 kg/m  GEN- NAD, alert and oriented x3 HEENT- PERRL, EOMI, non injected sclera, pink conjunctiva, MMM, oropharynx clear Neck- Supple, no thyromegaly CVS- RRR, no murmur RESP-CTAB ABD-NABS,soft,NT,ND EXT- No edema Pulses- Radial  2+        Assessment & Plan:      Problem List Items Addressed This Visit      Unprioritized   Hyperlipidemia   Relevant Orders   Lipid panel   Anemia   Relevant Orders   CBC with Differential/Platelet   Iron, TIBC and Ferritin Panel   Hypothyroid   Chronic pain   Relevant Medications   oxyCODONE (OXY IR/ROXICODONE) 5 MG immediate release tablet   Chronic kidney disease (CKD), stage IV (severe) (HCC) - Primary    Review nephrology note. She does have iron deficiency anemia advised that she can use the liquid iron over-the-counter. We'll recheck her levels today.  Also discussed I doubt that the iron cause the diarrhea she has been on this long-term likely had a gastroenteritis. For her chronic pain her pain medication was refilled  Hypothyroidism her thyroid function was recently checked and at goal.  Flu shot given      Relevant Orders   Comprehensive metabolic panel    Other Visit Diagnoses    Need for prophylactic vaccination and inoculation against influenza       Relevant Orders   Flu Vaccine QUAD 6+ mos PF IM (Fluarix Quad PF) (Completed)      Note: This dictation was prepared with Dragon dictation along with smaller phrase technology. Any transcriptional errors that result from this process are unintentional.

## 2017-03-19 NOTE — Assessment & Plan Note (Signed)
Review nephrology note. She does have iron deficiency anemia advised that she can use the liquid iron over-the-counter. We'll recheck her levels today.  Also discussed I doubt that the iron cause the diarrhea she has been on this long-term likely had a gastroenteritis. For her chronic pain her pain medication was refilled  Hypothyroidism her thyroid function was recently checked and at goal.  Flu shot given

## 2017-04-19 ENCOUNTER — Other Ambulatory Visit: Payer: Self-pay | Admitting: Family Medicine

## 2017-07-04 ENCOUNTER — Other Ambulatory Visit: Payer: Self-pay | Admitting: Family Medicine

## 2017-07-19 ENCOUNTER — Other Ambulatory Visit: Payer: Self-pay | Admitting: Family Medicine

## 2017-08-03 DIAGNOSIS — Z1231 Encounter for screening mammogram for malignant neoplasm of breast: Secondary | ICD-10-CM | POA: Diagnosis not present

## 2017-09-17 ENCOUNTER — Other Ambulatory Visit: Payer: Self-pay

## 2017-09-17 ENCOUNTER — Ambulatory Visit (INDEPENDENT_AMBULATORY_CARE_PROVIDER_SITE_OTHER): Payer: Medicare Other | Admitting: Family Medicine

## 2017-09-17 ENCOUNTER — Encounter: Payer: Self-pay | Admitting: Family Medicine

## 2017-09-17 VITALS — BP 104/62 | HR 86 | Temp 97.6°F | Resp 16 | Ht 64.0 in | Wt 111.0 lb

## 2017-09-17 DIAGNOSIS — E782 Mixed hyperlipidemia: Secondary | ICD-10-CM | POA: Diagnosis not present

## 2017-09-17 DIAGNOSIS — E038 Other specified hypothyroidism: Secondary | ICD-10-CM

## 2017-09-17 DIAGNOSIS — F79 Unspecified intellectual disabilities: Secondary | ICD-10-CM

## 2017-09-17 DIAGNOSIS — Z0000 Encounter for general adult medical examination without abnormal findings: Secondary | ICD-10-CM | POA: Diagnosis not present

## 2017-09-17 DIAGNOSIS — G894 Chronic pain syndrome: Secondary | ICD-10-CM

## 2017-09-17 DIAGNOSIS — M816 Localized osteoporosis [Lequesne]: Secondary | ICD-10-CM | POA: Diagnosis not present

## 2017-09-17 DIAGNOSIS — N184 Chronic kidney disease, stage 4 (severe): Secondary | ICD-10-CM

## 2017-09-17 MED ORDER — LEVOTHYROXINE SODIUM 75 MCG PO TABS: ORAL_TABLET | ORAL | 2 refills | Status: DC

## 2017-09-17 MED ORDER — FENOFIBRATE 48 MG PO TABS: ORAL_TABLET | ORAL | 2 refills | Status: DC

## 2017-09-17 MED ORDER — TETANUS-DIPHTH-ACELL PERTUSSIS 5-2-15.5 LF-MCG/0.5 IM SUSP: 0.5000 mL | Freq: Once | INTRAMUSCULAR | 0 refills | Status: AC

## 2017-09-17 MED ORDER — OXYCODONE HCL 5 MG PO TABS: 2.5000 mg | ORAL_TABLET | Freq: Every evening | ORAL | 0 refills | Status: DC | PRN

## 2017-09-17 NOTE — Assessment & Plan Note (Signed)
F/u by nephrology, also has anemia of renal disease mixed with iron def Restart iron tablets

## 2017-09-17 NOTE — Assessment & Plan Note (Signed)
Synthroid continue, check TFT

## 2017-09-17 NOTE — Patient Instructions (Addendum)
Iron tablet restart  Schedule Bone Density  Release of records- Dr. Ahmed Prima OB/GYN TDAP to pharmacy  F/U 6 months

## 2017-09-17 NOTE — Addendum Note (Signed)
Addended by: Sheral Flow on: 09/17/2017 10:23 AM   Modules accepted: Orders

## 2017-09-17 NOTE — Assessment & Plan Note (Signed)
Refilled oxycodone.

## 2017-09-17 NOTE — Progress Notes (Signed)
Subjective:   Patient presents for Medicare Annual/Subsequent preventive examination.   Anemia- has been out of her iron tablets for past 2 months per sister Doing well no concerns   Info obtained from patient and her sister who is caregiver  Review Past Medical/Family/Social: Per EMR   Risk Factors  Current exercise habits: walks  Dietary issues discussed: None,very picky eater   Cardiac risk factors: CKD, hyperlipidemia   Depression Screen  (Note: if answer to either of the following is "Yes", a more complete depression screening is indicated)  Over the past two weeks, have you felt down, depressed or hopeless? No Over the past two weeks, have you felt little interest or pleasure in doing things? No Have you lost interest or pleasure in daily life? No Do you often feel hopeless? No Do you cry easily over simple problems? No   Activities of Daily Living  In your present state of health, do you have any difficulty performing the following activities?:  Driving? No  Managing money? No  Feeding yourself? No  Getting from bed to chair? No  Climbing a flight of stairs? No  Preparing food and eating?: No  Bathing or showering? No  Getting dressed: No  Getting to the toilet? No  Using the toilet:No  Moving around from place to place: No  In the past year have you fallen or had a near fall?:No  Are you sexually active? No  Do you have more than one partner? No   Hearing Difficulties: No  Do you often ask people to speak up or repeat themselves? No  Do you experience ringing or noises in your ears? No Do you have difficulty understanding soft or whispered voices? No  Do you feel that you have a problem with memory? No Do you often misplace items? No  Do you feel safe at home? Yes  Cognitive Testing - Mental Retardation  Alert? Yes Normal Appearance?Yes  Oriented to person? Yes Place? Yes  Time? Yes  Recall of three objects? Yes  Can perform simple calculations? Yes   Displays appropriate judgment?Yes    List the Names of Other Physician/Practitioners you currently use:   GYN- Dr. Ahmed Prima  Nephrology- Dr Moshe Cipro    Screening Tests / Date Colonoscopy       UTD              Zostavax Declines  Mammogram  UTD, had done this year  Influenza Vaccine - UTD Tetanus/tdapDue  Bone Density - Due GYN- Due     Assessment:    Annual wellness medicare exam   Plan:    During the course of the visit the patient was educated and counseled about appropriate screening and preventive services including:  Screening mammography obtain result from [facility] Density to be done- pt sister to schedule TDAp Sent to pharmacy   Screen Neg  for depression.   She is maintaining weight, doing well, no injuries no recent illness Sister is caregiver, POA   Diet review for nutrition referral? Yes ____ Not Indicated __x__  Patient Instructions (the written plan) was given to the patient.  Medicare Attestation  I have personally reviewed:  The patient's medical and social history  Their use of alcohol, tobacco or illicit drugs  Their current medications and supplements  The patient's functional ability including ADLs,fall risks, home safety risks, cognitive, and hearing and visual impairment  Diet and physical activities  Evidence for depression or mood disorders  The patient's weight, height, BMI, and  visual acuity have been recorded in the chart. I have made referrals, counseling, and provided education to the patient based on review of the above and I have provided the patient with a written personalized care plan for preventive services.

## 2017-09-18 ENCOUNTER — Other Ambulatory Visit: Payer: Self-pay | Admitting: *Deleted

## 2017-09-18 DIAGNOSIS — E038 Other specified hypothyroidism: Secondary | ICD-10-CM

## 2017-09-18 DIAGNOSIS — N184 Chronic kidney disease, stage 4 (severe): Secondary | ICD-10-CM

## 2017-09-18 LAB — COMPREHENSIVE METABOLIC PANEL
AG Ratio: 1.8 (calc) (ref 1.0–2.5)
ALKALINE PHOSPHATASE (APISO): 80 U/L (ref 33–130)
ALT: 23 U/L (ref 6–29)
AST: 37 U/L — AB (ref 10–35)
Albumin: 4.6 g/dL (ref 3.6–5.1)
BUN/Creatinine Ratio: 17 (calc) (ref 6–22)
BUN: 43 mg/dL — ABNORMAL HIGH (ref 7–25)
CALCIUM: 9.1 mg/dL (ref 8.6–10.4)
CO2: 21 mmol/L (ref 20–32)
CREATININE: 2.49 mg/dL — AB (ref 0.50–0.99)
Chloride: 101 mmol/L (ref 98–110)
GLOBULIN: 2.5 g/dL (ref 1.9–3.7)
Glucose, Bld: 70 mg/dL (ref 65–99)
Potassium: 5.1 mmol/L (ref 3.5–5.3)
Sodium: 134 mmol/L — ABNORMAL LOW (ref 135–146)
Total Bilirubin: 0.7 mg/dL (ref 0.2–1.2)
Total Protein: 7.1 g/dL (ref 6.1–8.1)

## 2017-09-18 LAB — CBC WITH DIFFERENTIAL/PLATELET
BASOS PCT: 0.4 %
Basophils Absolute: 32 cells/uL (ref 0–200)
EOS ABS: 71 {cells}/uL (ref 15–500)
Eosinophils Relative: 0.9 %
HCT: 25.6 % — ABNORMAL LOW (ref 35.0–45.0)
Hemoglobin: 8.8 g/dL — ABNORMAL LOW (ref 11.7–15.5)
Lymphs Abs: 1090 cells/uL (ref 850–3900)
MCH: 30.2 pg (ref 27.0–33.0)
MCHC: 34.4 g/dL (ref 32.0–36.0)
MCV: 88 fL (ref 80.0–100.0)
MPV: 9.7 fL (ref 7.5–12.5)
Monocytes Relative: 8.3 %
NEUTROS PCT: 76.6 %
Neutro Abs: 6051 cells/uL (ref 1500–7800)
PLATELETS: 230 10*3/uL (ref 140–400)
RBC: 2.91 10*6/uL — ABNORMAL LOW (ref 3.80–5.10)
RDW: 12.8 % (ref 11.0–15.0)
TOTAL LYMPHOCYTE: 13.8 %
WBC: 7.9 10*3/uL (ref 3.8–10.8)
WBCMIX: 656 {cells}/uL (ref 200–950)

## 2017-09-18 LAB — LIPID PANEL
CHOL/HDL RATIO: 3.1 (calc) (ref <5.0)
CHOLESTEROL: 161 mg/dL (ref <200)
HDL: 52 mg/dL (ref >50)
LDL Cholesterol (Calc): 92 mg/dL (calc)
Non-HDL Cholesterol (Calc): 109 mg/dL (calc) (ref <130)
Triglycerides: 81 mg/dL (ref <150)

## 2017-09-18 LAB — T3, FREE: T3 FREE: 2.5 pg/mL (ref 2.3–4.2)

## 2017-09-18 LAB — VITAMIN D 25 HYDROXY (VIT D DEFICIENCY, FRACTURES): Vit D, 25-Hydroxy: 33 ng/mL (ref 30–100)

## 2017-09-18 LAB — T4, FREE: FREE T4: 1.3 ng/dL (ref 0.8–1.8)

## 2017-09-18 LAB — TSH: TSH: 0.29 m[IU]/L — AB (ref 0.40–4.50)

## 2017-09-20 ENCOUNTER — Encounter: Payer: Self-pay | Admitting: *Deleted

## 2017-10-11 ENCOUNTER — Other Ambulatory Visit: Payer: Self-pay | Admitting: Family Medicine

## 2017-10-15 ENCOUNTER — Other Ambulatory Visit: Payer: Medicare Other

## 2017-10-15 DIAGNOSIS — E038 Other specified hypothyroidism: Secondary | ICD-10-CM | POA: Diagnosis not present

## 2017-10-15 DIAGNOSIS — N184 Chronic kidney disease, stage 4 (severe): Secondary | ICD-10-CM

## 2017-10-16 ENCOUNTER — Other Ambulatory Visit: Payer: Self-pay | Admitting: *Deleted

## 2017-10-16 LAB — BASIC METABOLIC PANEL
BUN / CREAT RATIO: 24 (calc) — AB (ref 6–22)
BUN: 41 mg/dL — ABNORMAL HIGH (ref 7–25)
CALCIUM: 8.7 mg/dL (ref 8.6–10.4)
CHLORIDE: 108 mmol/L (ref 98–110)
CO2: 25 mmol/L (ref 20–32)
Creat: 1.73 mg/dL — ABNORMAL HIGH (ref 0.50–0.99)
Glucose, Bld: 116 mg/dL — ABNORMAL HIGH (ref 65–99)
Potassium: 4.7 mmol/L (ref 3.5–5.3)
Sodium: 141 mmol/L (ref 135–146)

## 2017-10-16 LAB — TSH: TSH: 0.3 mIU/L — ABNORMAL LOW (ref 0.40–4.50)

## 2017-10-16 LAB — EXTRA LAV TOP TUBE

## 2017-10-16 MED ORDER — LEVOTHYROXINE SODIUM 25 MCG PO TABS: 12.5000 ug | ORAL_TABLET | Freq: Every day | ORAL | 1 refills | Status: DC

## 2017-10-16 MED ORDER — LEVOTHYROXINE SODIUM 50 MCG PO TABS: 50.0000 ug | ORAL_TABLET | Freq: Every day | ORAL | 1 refills | Status: DC

## 2017-12-12 ENCOUNTER — Other Ambulatory Visit: Payer: Self-pay | Admitting: Family Medicine

## 2018-01-10 ENCOUNTER — Other Ambulatory Visit: Payer: Self-pay | Admitting: Family Medicine

## 2018-01-14 ENCOUNTER — Encounter: Payer: Self-pay | Admitting: Family Medicine

## 2018-01-21 ENCOUNTER — Telehealth: Payer: Self-pay | Admitting: Family Medicine

## 2018-01-21 NOTE — Telephone Encounter (Signed)
Noted, if she refuses to eat again, take her to the ER, she could have worsening renal failure, dehydration ETC

## 2018-01-21 NOTE — Telephone Encounter (Signed)
Call placed to patient and patient sister Shawna [name] made aware.

## 2018-01-21 NOTE — Telephone Encounter (Signed)
Pts sister Benjamine Mola called in concerned about sister, she had me schedule her for an appointment on Wednesday for anorexia, needs to speak with you before her appointment.

## 2018-01-21 NOTE — Telephone Encounter (Signed)
Call placed to patient sister, Shawna [name].   States that she is currently out of town on vacation. Reports that while she has been out of town, patient has had frequent episodes of dizziness. Reports that patient has stated that she is fat and has completely stopped eating. Reports that once patient stopped eating, she noted increased dizziness. Patient has currently dropped from 110lbs to 104lbs in 1 week.   Patient sister has discussed with patient that she is not fat and needs to gain weight. Sister threatened to not allow patient to come on vacation at the end of the week if she continues to refuse to eat. Patient has started eating again and noted dizziness has improved.   Shawna [name]'s Husband will be bringing patient to appointment on Wed to discuss with MD. MD to be made aware.

## 2018-01-23 ENCOUNTER — Encounter: Payer: Self-pay | Admitting: Family Medicine

## 2018-01-23 ENCOUNTER — Other Ambulatory Visit: Payer: Medicare Other

## 2018-01-23 ENCOUNTER — Other Ambulatory Visit: Payer: Self-pay

## 2018-01-23 ENCOUNTER — Ambulatory Visit (INDEPENDENT_AMBULATORY_CARE_PROVIDER_SITE_OTHER): Payer: Medicare Other | Admitting: Family Medicine

## 2018-01-23 VITALS — BP 102/60 | HR 80 | Temp 97.9°F | Resp 14 | Ht 64.0 in | Wt 104.6 lb

## 2018-01-23 DIAGNOSIS — R748 Abnormal levels of other serum enzymes: Secondary | ICD-10-CM

## 2018-01-23 DIAGNOSIS — R634 Abnormal weight loss: Secondary | ICD-10-CM

## 2018-01-23 DIAGNOSIS — R1030 Lower abdominal pain, unspecified: Secondary | ICD-10-CM | POA: Diagnosis not present

## 2018-01-23 DIAGNOSIS — N184 Chronic kidney disease, stage 4 (severe): Secondary | ICD-10-CM | POA: Diagnosis not present

## 2018-01-23 DIAGNOSIS — E038 Other specified hypothyroidism: Secondary | ICD-10-CM

## 2018-01-23 DIAGNOSIS — E860 Dehydration: Secondary | ICD-10-CM

## 2018-01-23 DIAGNOSIS — D631 Anemia in chronic kidney disease: Secondary | ICD-10-CM

## 2018-01-23 LAB — CBC WITH DIFFERENTIAL/PLATELET
BASOS ABS: 10 {cells}/uL (ref 0–200)
Basophils Relative: 0.2 %
EOS PCT: 0.4 %
Eosinophils Absolute: 20 cells/uL (ref 15–500)
HEMATOCRIT: 26.9 % — AB (ref 35.0–45.0)
Hemoglobin: 9.1 g/dL — ABNORMAL LOW (ref 11.7–15.5)
Lymphs Abs: 1705 cells/uL (ref 850–3900)
MCH: 30.6 pg (ref 27.0–33.0)
MCHC: 33.8 g/dL (ref 32.0–36.0)
MCV: 90.6 fL (ref 80.0–100.0)
MONOS PCT: 6.6 %
MPV: 10.1 fL (ref 7.5–12.5)
NEUTROS PCT: 58.7 %
Neutro Abs: 2935 cells/uL (ref 1500–7800)
PLATELETS: 198 10*3/uL (ref 140–400)
RBC: 2.97 10*6/uL — ABNORMAL LOW (ref 3.80–5.10)
RDW: 13.6 % (ref 11.0–15.0)
TOTAL LYMPHOCYTE: 34.1 %
WBC mixed population: 330 cells/uL (ref 200–950)
WBC: 5 10*3/uL (ref 3.8–10.8)

## 2018-01-23 LAB — LIPASE: Lipase: 82 U/L — ABNORMAL HIGH (ref 7–60)

## 2018-01-23 LAB — URINALYSIS, ROUTINE W REFLEX MICROSCOPIC
BILIRUBIN URINE: NEGATIVE
GLUCOSE, UA: NEGATIVE
Hyaline Cast: NONE SEEN /LPF
Ketones, ur: NEGATIVE
NITRITE: NEGATIVE
Protein, ur: NEGATIVE
SPECIFIC GRAVITY, URINE: 1.015 (ref 1.001–1.03)
Squamous Epithelial / LPF: NONE SEEN /HPF (ref <=5)
pH: 5.5 (ref 5.0–8.0)

## 2018-01-23 LAB — COMPREHENSIVE METABOLIC PANEL
AG Ratio: 1.8 (calc) (ref 1.0–2.5)
ALT: 30 U/L — AB (ref 6–29)
AST: 48 U/L — ABNORMAL HIGH (ref 10–35)
Albumin: 4.6 g/dL (ref 3.6–5.1)
Alkaline phosphatase (APISO): 62 U/L (ref 33–130)
BUN/Creatinine Ratio: 20 (calc) (ref 6–22)
BUN: 42 mg/dL — AB (ref 7–25)
CO2: 19 mmol/L — ABNORMAL LOW (ref 20–32)
CREATININE: 2.15 mg/dL — AB (ref 0.50–0.99)
Calcium: 9.3 mg/dL (ref 8.6–10.4)
Chloride: 104 mmol/L (ref 98–110)
Globulin: 2.5 g/dL (calc) (ref 1.9–3.7)
Glucose, Bld: 94 mg/dL (ref 65–99)
Potassium: 4.4 mmol/L (ref 3.5–5.3)
SODIUM: 134 mmol/L — AB (ref 135–146)
TOTAL PROTEIN: 7.1 g/dL (ref 6.1–8.1)
Total Bilirubin: 0.7 mg/dL (ref 0.2–1.2)

## 2018-01-23 LAB — MICROSCOPIC MESSAGE

## 2018-01-23 LAB — TSH: TSH: 0.25 m[IU]/L — AB (ref 0.40–4.50)

## 2018-01-23 NOTE — Assessment & Plan Note (Signed)
Decrease her Synthroid to 50 mcg once a day.

## 2018-01-23 NOTE — Patient Instructions (Signed)
We will call with the STAT Labs And medications needed

## 2018-01-23 NOTE — Progress Notes (Signed)
Subjective:    Patient ID: Shawna [name], female    DOB: [birth year], 62 y.o.   MRN: [id number]  Patient presents for Weight Loss (loss of appetitie, lethargy, dizziness, HA)   Pt here with brother in law. Her sister who is caregiver called in stating she would not eat for past few weeks, She states her stomach hurts when she eats, points lower down and on her left side. No vomiting, no fever. She has had some diarrhea , no blood in sthe stool. Has been dizzy and weak, has headaches.   She has taken thyroid medicine in the morning, but stopped her iron ,. Son-in-law describes an episode where she has become so weak that she is almost fainted.  Last week she tried to go out to eat dinner with family friend she was unable to get out of the car because she felt weak. 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     Hypothyroidism-TSH was too high in March, repeated 6 weeks later still 0.3, she was told to alternate 75mg  and 50mg  by my partner due for recheck   She also has CKD Cr peaked at 2.4 in March, came down to 1.74 in April at recheck, she has nephrologist  Her baseline fluctate 1.6-1.9   Review Of Systems:  GEN- denies fatigue, fever, weight loss,weakness, recent illness HEENT- denies eye drainage, change in vision, nasal discharge, CVS- denies chest pain, palpitations RESP- denies SOB, cough, wheeze ABD- denies N/V, change in stools,+ abd pain GU- denies dysuria, hematuria, dribbling, incontinence MSK- denies joint pain, muscle aches, injury Neuro- denies headache, dizziness, syncope, seizure activity       Objective:    BP 102/60   Pulse 80   Temp 97.9 F (36.6 C) (Oral)   Resp 14   Ht 5\' 4"  (1.626 m)   Wt 104 lb 9.6 oz (47.4 kg)   SpO2 98%   BMI 17.95 kg/m  GEN- NAD, alert and oriented x3,weak appearing  HEENT- PERRL, EOMI, non injected sclera, pink conjunctiva, dry MM, oropharynx clear Neck-  Supple, no thyromegaly CVS- RRR, no murmur RESP-CTAB ABD-NABS,soft,ND, TTP lower abdomen, mild TTP LUQ, no rebound, no guarding EXT- No edema Pulses- Radial, DP- 2+        Assessment & Plan:      Problem List Items Addressed This Visit      Unprioritized   Anemia   Chronic kidney disease (CKD), stage IV (severe) (HCC)   Relevant Orders   Comprehensive metabolic panel (Completed)   Hypothyroid    Decrease her Synthroid to 50 mcg once a day.      Relevant Orders   TSH (Completed)   Loss of weight - Primary    Concerned that with her dehydration abdominal pain possible infectious process.  She has worsening renal function the setting of her dehydration.  She was able to finally bring a urine sample back she did have some mild blood trace leukocytes therefore we will send this for culture.  Her white blood cells were not elevated however on her stat labs her lipase was elevated along with her liver function test which now throws pancreatitis/colitis/hepatitis etiology into the mix.  I think that this warrants a CT of her abdomen. I discussed with her daughter Shawna [name] her results.  She states that she has actually had episodes like this years ago she was in the hospital for a  while in order to repeat and get her weight up they cannot find any specific cause for her pain or the anorexia.  She states that this evening after she left getting her IV fluids she was able to eat a banana sandwich and did not have any difficulties.  Advised her that her labs are still abnormal with her having symptoms the past few weeks she still would need a scan.  They are going to take her tomorrow morning.  Given her red flags in case she has worsening pain tonight or she has uncontrollable vomiting fever take her to the ER.  They are in agreement with this plan.      Relevant Orders   CBC with Differential/Platelet (Completed)   Comprehensive metabolic panel (Completed)   CT Abdomen Pelvis Wo  Contrast    Other Visit Diagnoses    Lower abdominal pain       Relevant Orders   Lipase (Completed)   Urinalysis, Routine w reflex microscopic (Completed)   Urine Culture   CT Abdomen Pelvis Wo Contrast   Dehydration       Give about 750CC of IVF in office   Abnormal weight loss       Relevant Orders   CT Abdomen Pelvis Wo Contrast   Elevated lipase       Relevant Orders   CT Abdomen Pelvis Wo Contrast      Note: This dictation was prepared with Dragon dictation along with smaller phrase technology. Any transcriptional errors that result from this process are unintentional.

## 2018-01-23 NOTE — Assessment & Plan Note (Addendum)
Concerned that with her dehydration abdominal pain possible infectious process.  She has worsening renal function the setting of her dehydration.  She was able to finally bring a urine sample back she did have some mild blood trace leukocytes therefore we will send this for culture.  Her white blood cells were not elevated however on her stat labs her lipase was elevated along with her liver function test which now throws pancreatitis/colitis/hepatitis etiology into the mix.  I think that this warrants a CT of her abdomen. I discussed with her daughter [name] her results.  She states that she has actually had episodes like this years ago she was in the hospital for a while in order to repeat and get her weight up they cannot find any specific cause for her pain or the anorexia.  She states that this evening after she left getting her IV fluids she was able to eat a banana sandwich and did not have any difficulties.  Advised her that her labs are still abnormal with her having symptoms the past few weeks she still would need a scan.  They are going to take her tomorrow morning.  Given her red flags in case she has worsening pain tonight or she has uncontrollable vomiting fever take her to the ER.  They are in agreement with this plan.

## 2018-01-24 ENCOUNTER — Ambulatory Visit (HOSPITAL_COMMUNITY)
Admission: RE | Admit: 2018-01-24 | Discharge: 2018-01-24 | Disposition: A | Discharge status: Home / Self Care | Payer: Medicare Other | Source: Ambulatory Visit | Attending: Family Medicine | Admitting: Family Medicine

## 2018-01-24 DIAGNOSIS — R634 Abnormal weight loss: Secondary | ICD-10-CM | POA: Insufficient documentation

## 2018-01-24 DIAGNOSIS — R748 Abnormal levels of other serum enzymes: Secondary | ICD-10-CM | POA: Diagnosis not present

## 2018-01-24 DIAGNOSIS — R1030 Lower abdominal pain, unspecified: Secondary | ICD-10-CM | POA: Insufficient documentation

## 2018-01-25 ENCOUNTER — Other Ambulatory Visit: Payer: Self-pay | Admitting: *Deleted

## 2018-01-25 DIAGNOSIS — R634 Abnormal weight loss: Secondary | ICD-10-CM

## 2018-01-25 DIAGNOSIS — N184 Chronic kidney disease, stage 4 (severe): Secondary | ICD-10-CM

## 2018-01-25 DIAGNOSIS — R7989 Other specified abnormal findings of blood chemistry: Secondary | ICD-10-CM

## 2018-01-25 DIAGNOSIS — R945 Abnormal results of liver function studies: Secondary | ICD-10-CM

## 2018-01-25 LAB — URINE CULTURE
MICRO NUMBER:: 90905536
SPECIMEN QUALITY: ADEQUATE

## 2018-01-25 MED ORDER — CEPHALEXIN 250 MG PO CAPS: 250.0000 mg | ORAL_CAPSULE | Freq: Four times a day (QID) | ORAL | 0 refills | Status: AC

## 2018-01-25 MED ORDER — RANITIDINE HCL 150 MG PO TABS: 150.0000 mg | ORAL_TABLET | Freq: Two times a day (BID) | ORAL | 3 refills | Status: DC

## 2018-02-08 ENCOUNTER — Other Ambulatory Visit: Payer: Medicare Other

## 2018-02-08 DIAGNOSIS — R945 Abnormal results of liver function studies: Secondary | ICD-10-CM | POA: Diagnosis not present

## 2018-02-08 DIAGNOSIS — N184 Chronic kidney disease, stage 4 (severe): Secondary | ICD-10-CM | POA: Diagnosis not present

## 2018-02-08 DIAGNOSIS — R634 Abnormal weight loss: Secondary | ICD-10-CM

## 2018-02-08 DIAGNOSIS — R7989 Other specified abnormal findings of blood chemistry: Secondary | ICD-10-CM

## 2018-02-09 LAB — COMPLETE METABOLIC PANEL WITH GFR
AG Ratio: 1.6 (calc) (ref 1.0–2.5)
ALT: 19 U/L (ref 6–29)
AST: 37 U/L — AB (ref 10–35)
Albumin: 4.1 g/dL (ref 3.6–5.1)
Alkaline phosphatase (APISO): 55 U/L (ref 33–130)
BILIRUBIN TOTAL: 0.5 mg/dL (ref 0.2–1.2)
BUN/Creatinine Ratio: 20 (calc) (ref 6–22)
BUN: 45 mg/dL — ABNORMAL HIGH (ref 7–25)
CALCIUM: 8.9 mg/dL (ref 8.6–10.4)
CHLORIDE: 103 mmol/L (ref 98–110)
CO2: 21 mmol/L (ref 20–32)
Creat: 2.22 mg/dL — ABNORMAL HIGH (ref 0.50–0.99)
GFR, EST AFRICAN AMERICAN: 27 mL/min/{1.73_m2} — AB (ref >=60)
GFR, EST NON AFRICAN AMERICAN: 23 mL/min/{1.73_m2} — AB (ref >=60)
GLOBULIN: 2.5 g/dL (ref 1.9–3.7)
GLUCOSE: 87 mg/dL (ref 65–99)
POTASSIUM: 5.7 mmol/L — AB (ref 3.5–5.3)
SODIUM: 135 mmol/L (ref 135–146)
TOTAL PROTEIN: 6.6 g/dL (ref 6.1–8.1)

## 2018-02-09 LAB — CBC WITH DIFFERENTIAL/PLATELET
BASOS ABS: 8 {cells}/uL (ref 0–200)
Basophils Relative: 0.2 %
EOS ABS: 29 {cells}/uL (ref 15–500)
Eosinophils Relative: 0.7 %
HEMATOCRIT: 25 % — AB (ref 35.0–45.0)
Hemoglobin: 8.4 g/dL — ABNORMAL LOW (ref 11.7–15.5)
LYMPHS ABS: 1302 {cells}/uL (ref 850–3900)
MCH: 30.5 pg (ref 27.0–33.0)
MCHC: 33.6 g/dL (ref 32.0–36.0)
MCV: 90.9 fL (ref 80.0–100.0)
MPV: 10.4 fL (ref 7.5–12.5)
Monocytes Relative: 10.2 %
NEUTROS PCT: 57.9 %
Neutro Abs: 2432 cells/uL (ref 1500–7800)
PLATELETS: 204 10*3/uL (ref 140–400)
RBC: 2.75 10*6/uL — ABNORMAL LOW (ref 3.80–5.10)
RDW: 13.2 % (ref 11.0–15.0)
TOTAL LYMPHOCYTE: 31 %
WBC mixed population: 428 cells/uL (ref 200–950)
WBC: 4.2 10*3/uL (ref 3.8–10.8)

## 2018-02-09 LAB — LIPASE: Lipase: 79 U/L — ABNORMAL HIGH (ref 7–60)

## 2018-02-11 ENCOUNTER — Other Ambulatory Visit: Payer: Self-pay | Admitting: *Deleted

## 2018-02-11 DIAGNOSIS — E875 Hyperkalemia: Secondary | ICD-10-CM

## 2018-02-13 ENCOUNTER — Other Ambulatory Visit: Payer: Medicare Other

## 2018-02-13 ENCOUNTER — Other Ambulatory Visit: Payer: Self-pay | Admitting: Family Medicine

## 2018-02-13 DIAGNOSIS — E875 Hyperkalemia: Secondary | ICD-10-CM

## 2018-02-14 LAB — BASIC METABOLIC PANEL
BUN/Creatinine Ratio: 18 (calc) (ref 6–22)
BUN: 43 mg/dL — ABNORMAL HIGH (ref 7–25)
CHLORIDE: 103 mmol/L (ref 98–110)
CO2: 24 mmol/L (ref 20–32)
Calcium: 8.9 mg/dL (ref 8.6–10.4)
Creat: 2.37 mg/dL — ABNORMAL HIGH (ref 0.50–0.99)
Glucose, Bld: 111 mg/dL — ABNORMAL HIGH (ref 65–99)
Potassium: 4.8 mmol/L (ref 3.5–5.3)
Sodium: 136 mmol/L (ref 135–146)

## 2018-03-11 ENCOUNTER — Ambulatory Visit (INDEPENDENT_AMBULATORY_CARE_PROVIDER_SITE_OTHER): Payer: Medicare Other | Admitting: Physician Assistant

## 2018-03-11 ENCOUNTER — Encounter: Payer: Self-pay | Admitting: Physician Assistant

## 2018-03-11 VITALS — BP 100/78 | HR 80 | Temp 97.7°F | Resp 16 | Ht 64.0 in | Wt 108.8 lb

## 2018-03-11 DIAGNOSIS — N179 Acute kidney failure, unspecified: Secondary | ICD-10-CM | POA: Diagnosis not present

## 2018-03-11 DIAGNOSIS — I959 Hypotension, unspecified: Secondary | ICD-10-CM | POA: Diagnosis not present

## 2018-03-11 DIAGNOSIS — D509 Iron deficiency anemia, unspecified: Secondary | ICD-10-CM | POA: Diagnosis not present

## 2018-03-11 DIAGNOSIS — N184 Chronic kidney disease, stage 4 (severe): Secondary | ICD-10-CM | POA: Diagnosis not present

## 2018-03-11 DIAGNOSIS — G8929 Other chronic pain: Secondary | ICD-10-CM | POA: Diagnosis not present

## 2018-03-11 DIAGNOSIS — E039 Hypothyroidism, unspecified: Secondary | ICD-10-CM | POA: Diagnosis not present

## 2018-03-11 DIAGNOSIS — R634 Abnormal weight loss: Secondary | ICD-10-CM

## 2018-03-11 DIAGNOSIS — E785 Hyperlipidemia, unspecified: Secondary | ICD-10-CM | POA: Diagnosis not present

## 2018-03-11 NOTE — Progress Notes (Signed)
Patient ID: Shawna [name] MRN: [id number], DOB: 19-Sep-1955, 62 y.o. Date of Encounter: 03/11/2018, 9:34 AM    Chief Complaint:  Chief Complaint  Patient presents with  . follow up weight     HPI: 62 y.o. year old female presents with above.   Prior to her visit, I reviewed her last visit note with Dr. Buelah Manis dated 01/23/2018. That note documents that she has mental retardation since birth. Also notes that her sister is caregiver.  At [place] 01/23/2018 brother-in-law was accompanying her for visit.  They had recently called in stating that she would not eat for past several weeks.  Also would report the hurts when she eats and pointed to her lower left abdomen.  At that visit the son-in-law also described an episode where she had become weak and felt presyncope.  The prior week they had gone to dinner with family friend and she was unable to get out of the car because she felt weak.  At that visit Dr. Buelah Manis reviewed that in March her weight had been 111 pounds and at the time of that visit 01/23/2018 weight was 104 pound.  She also reviewed that she is on thyroid medication for hypothyroidism and reviewed recent TSH.  I reviewed that she has CKD and does have a nephrologist.  Dr Dorian Heckle assessment plan then has further information regarding lab results and obtaining CT scan.  I did review the CT report from 01/24/2018.  That CT showed no acute abnormality.    Today she is accompanied by her sister for visit. Her sister reports that they figured out that the reason patient was not eating was because she feels like she is fat and thinks that she is fat and purposely was not eating and was purposely trying to lose weight. Sister reports that patient has been having no abdominal pain and no true anorexia or pain with eating.  Reports that all of that has completely cleared and resolved and is stable.  Also I did review that today her weight is reading 108.8 pounds.  Patient and sister have no  specific concerns to address.  Did review that her last TSH was slightly out of range so we will recheck thyroid labs today.     Home Meds:   Outpatient Medications Prior to Visit  Medication Sig Dispense Refill  . aspirin 81 MG tablet Take 81 mg by mouth daily.      . calcium gluconate 500 MG tablet Take 500 mg by mouth 2 (two) times daily.      Marland Kitchen levothyroxine (SYNTHROID, LEVOTHROID) 50 MCG tablet TAKE ONE TABLET (50MCG TOTAL) BY MOUTH DAILY. TAKE WITH 12.5MCG TO EQUAL 62.5MCG (Patient taking differently: 50 mcg. ) 30 tablet 1  . Multiple Vitamins-Minerals (CENTRUM SILVER) CHEW Chew 1 tablet by mouth daily.      Marland Kitchen oxyCODONE (OXY IR/ROXICODONE) 5 MG immediate release tablet Take 0.5 tablets (2.5 mg total) by mouth at bedtime as needed. 15 tablet 0  . ranitidine (ZANTAC) 150 MG tablet Take 1 tablet (150 mg total) by mouth 2 (two) times daily. 60 tablet 3  . fenofibrate (TRICOR) 48 MG tablet TAKE ONE (1) TABLET BY MOUTH EVERY DAY FOR CHOLESTEROL (Patient not taking: Reported on 01/23/2018) 90 tablet 2  . iron polysaccharides (NU-IRON) 150 MG capsule Take 1 capsule (150 mg total) by mouth daily. (Patient not taking: Reported on 01/23/2018)     No facility-administered medications prior to visit.     Allergies:  Allergies  Allergen  Reactions  . Bisphosphonates     Irritation of abdominal lining and oropharynx      Review of Systems: See HPI for pertinent ROS. All other ROS negative.    Physical Exam: Blood pressure 100/78, pulse 80, temperature 97.7 F (36.5 C), temperature source Oral, resp. rate 16, height 5\' 4"  (1.626 m), weight 49.4 kg, SpO2 100 %., Body mass index is 18.68 kg/m. General:  WF. Appears in no acute distress. Neck: Supple. No thyromegaly. No lymphadenopathy. Lungs: Clear bilaterally to auscultation without wheezes, rales, or rhonchi. Breathing is unlabored. Heart: Regular rhythm. No murmurs, rubs, or gallops. Abdomen: Soft, non-tender, non-distended with  normoactive bowel sounds. No hepatomegaly. No rebound/guarding. No obvious abdominal masses. Msk:  Strength and tone normal for age. Extremities/Skin: Warm and dry.  Neuro: Alert and oriented X 3. Moves all extremities spontaneously. Gait is normal. CNII-XII grossly in tact. Psych:  Responds to questions appropriately with a normal affect.     ASSESSMENT AND PLAN:  62 y.o. year old female with   1. Hypothyroidism, unspecified type She is on thyroid repletion.  Recheck TSH to monitor. - TSH  2. Chronic kidney disease (CKD), stage IV (severe) ([place]) This is managed by nephrologist.  3. Loss of weight Sister reports that patient's recent weight loss was secondary to intentional decreased eating as patient was trying to intentionally lose weight.  See HPI for detail.   [address] [place], [place], [facility] 03/11/2018 9:34 AM

## 2018-03-12 LAB — TSH: TSH: 2.96 m[IU]/L (ref 0.40–4.50)

## 2018-03-25 ENCOUNTER — Ambulatory Visit: Payer: Medicare Other | Admitting: Family Medicine

## 2018-04-13 ENCOUNTER — Other Ambulatory Visit: Payer: Self-pay | Admitting: Family Medicine

## 2018-06-11 ENCOUNTER — Other Ambulatory Visit: Payer: Self-pay | Admitting: Family Medicine

## 2018-08-10 ENCOUNTER — Other Ambulatory Visit: Payer: Self-pay | Admitting: Family Medicine

## 2018-09-09 ENCOUNTER — Ambulatory Visit (INDEPENDENT_AMBULATORY_CARE_PROVIDER_SITE_OTHER): Payer: Medicare Other | Admitting: Family Medicine

## 2018-09-09 ENCOUNTER — Other Ambulatory Visit: Payer: Self-pay

## 2018-09-09 ENCOUNTER — Encounter: Payer: Self-pay | Admitting: Family Medicine

## 2018-09-09 VITALS — BP 118/62 | HR 78 | Temp 98.7°F | Resp 14 | Ht 64.0 in | Wt 116.0 lb

## 2018-09-09 DIAGNOSIS — R0982 Postnasal drip: Secondary | ICD-10-CM | POA: Diagnosis not present

## 2018-09-09 DIAGNOSIS — R490 Dysphonia: Secondary | ICD-10-CM

## 2018-09-09 DIAGNOSIS — E039 Hypothyroidism, unspecified: Secondary | ICD-10-CM

## 2018-09-09 DIAGNOSIS — N184 Chronic kidney disease, stage 4 (severe): Secondary | ICD-10-CM

## 2018-09-09 DIAGNOSIS — G894 Chronic pain syndrome: Secondary | ICD-10-CM

## 2018-09-09 DIAGNOSIS — D631 Anemia in chronic kidney disease: Secondary | ICD-10-CM

## 2018-09-09 DIAGNOSIS — F79 Unspecified intellectual disabilities: Secondary | ICD-10-CM

## 2018-09-09 MED ORDER — METHYLPREDNISOLONE ACETATE 40 MG/ML IJ SUSP
40.0000 mg | Freq: Once | INTRAMUSCULAR | Status: AC
  Administered 2018-09-09: 40 mg via INTRAMUSCULAR

## 2018-09-09 MED ORDER — FENOFIBRATE 48 MG PO TABS: ORAL_TABLET | ORAL | 2 refills | Status: DC

## 2018-09-09 MED ORDER — LEVOTHYROXINE SODIUM 50 MCG PO TABS: ORAL_TABLET | ORAL | 1 refills | Status: DC

## 2018-09-09 MED ORDER — OXYCODONE HCL 5 MG PO TABS: 2.5000 mg | ORAL_TABLET | Freq: Two times a day (BID) | ORAL | 0 refills | Status: DC | PRN

## 2018-09-09 NOTE — Patient Instructions (Signed)
Give claritin  Depo Medrol given  We will call with lab results F/U 6 months for physical

## 2018-09-09 NOTE — Assessment & Plan Note (Signed)
Followed by nephrology, avoid NSAIDS BP controlled  Has some anemia as well of renal disease

## 2018-09-09 NOTE — Assessment & Plan Note (Signed)
Continue oxycodone, changed script to allow to take up to 1/2 tab BID, sister monitors meds

## 2018-09-09 NOTE — Progress Notes (Signed)
   Subjective:    Patient ID: Shawna [name], female    DOB: [birth year], 63 y.o.   MRN: [id number]  Patient presents for Follow-up (is not fasting) and Hoarse (denies fever, cough, sore throat)  Hoarse vouce since last Thursday, with , no OT meds used, no fever, little cough, some post nasal drip, no sore throat   Hypothyroidism- taking syntroid  7mcg once a day   Due for refill on pain meidcation  Runs out before her 6 month appointment  CKD- avoiding NSAID, followed by nephrology  Has been eating regulary, weight up 10lbs since July      Review Of Systems: per above  GEN- denies fatigue, fever, weight loss,weakness, recent illness HEENT- denies eye drainage, change in vision, nasal discharge, CVS- denies chest pain, palpitations RESP- denies SOB, +cough, wheeze ABD- denies N/V, change in stools, abd pain GU- denies dysuria, hematuria, dribbling, incontinence MSK- denies joint pain, muscle aches, injury Neuro- denies headache, dizziness, syncope, seizure activity       Objective:    BP 118/62   Pulse 78   Temp 98.7 F (37.1 C) (Oral)   Resp 14   Ht 5\' 4"  (1.626 m)   Wt 116 lb (52.6 kg)   SpO2 97%   BMI 19.91 kg/m  GEN- NAD, alert and oriented x3 HEENT- PERRL, EOMI, non injected sclera, pink conjunctiva, MMM, oropharynx clear, hoarse voice, clear nasal drainage, no sinus tenderness  Neck- Supple, no thyromegaly, no LAD CVS- RRR, no murmur RESP-CTAB ABD-NABS,soft,NT,ND EXT- No edema Pulses- Radial, DP- 2+        Assessment & Plan:      Problem List Items Addressed This Visit      Unprioritized   Anemia   Chronic kidney disease (CKD), stage IV (severe) (HCC)    Followed by nephrology, avoid NSAIDS BP controlled  Has some anemia as well of renal disease      Relevant Orders   CBC with Differential/Platelet (Completed)   Comprehensive metabolic panel   Lipid panel   Chronic pain    Continue oxycodone, changed script to allow to take up to 1/2 tab BID,  sister monitors meds      Relevant Medications   oxyCODONE (OXY IR/ROXICODONE) 5 MG immediate release tablet   methylPREDNISolone acetate (DEPO-MEDROL) injection 40 mg (Completed)   Hypothyroid - Primary   Relevant Medications   levothyroxine (SYNTHROID, LEVOTHROID) 50 MCG tablet   Other Relevant Orders   TSH   T3, free   T4, free   Intellectual disability    Other Visit Diagnoses    Hoarse voice quality       Given Depo Medrol 40mg  IM, use oral anti-histamine for post nasal allergy, does not appear sick, OTC home remedies- tea, gargle etc   Relevant Medications   methylPREDNISolone acetate (DEPO-MEDROL) injection 40 mg (Completed)   Post-nasal drip       Relevant Medications   methylPREDNISolone acetate (DEPO-MEDROL) injection 40 mg (Completed)      Note: This dictation was prepared with Dragon dictation along with smaller phrase technology. Any transcriptional errors that result from this process are unintentional.

## 2018-09-10 LAB — LIPID PANEL
CHOLESTEROL: 157 mg/dL (ref <200)
HDL: 47 mg/dL — ABNORMAL LOW (ref >=50)
LDL CHOLESTEROL (CALC): 88 mg/dL
Non-HDL Cholesterol (Calc): 110 mg/dL (calc) (ref <130)
TRIGLYCERIDES: 121 mg/dL (ref <150)
Total CHOL/HDL Ratio: 3.3 (calc) (ref <5.0)

## 2018-09-10 LAB — TSH: TSH: 4.23 mIU/L (ref 0.40–4.50)

## 2018-09-10 LAB — CBC WITH DIFFERENTIAL/PLATELET
Absolute Monocytes: 550 cells/uL (ref 200–950)
BASOS PCT: 0.2 %
Basophils Absolute: 13 cells/uL (ref 0–200)
EOS PCT: 0.2 %
Eosinophils Absolute: 13 cells/uL — ABNORMAL LOW (ref 15–500)
HCT: 27 % — ABNORMAL LOW (ref 35.0–45.0)
Hemoglobin: 9.3 g/dL — ABNORMAL LOW (ref 11.7–15.5)
Lymphs Abs: 966 cells/uL (ref 850–3900)
MCH: 31.4 pg (ref 27.0–33.0)
MCHC: 34.4 g/dL (ref 32.0–36.0)
MCV: 91.2 fL (ref 80.0–100.0)
MONOS PCT: 8.6 %
MPV: 9.7 fL (ref 7.5–12.5)
Neutro Abs: 4858 cells/uL (ref 1500–7800)
Neutrophils Relative %: 75.9 %
PLATELETS: 195 10*3/uL (ref 140–400)
RBC: 2.96 10*6/uL — AB (ref 3.80–5.10)
RDW: 13 % (ref 11.0–15.0)
Total Lymphocyte: 15.1 %
WBC: 6.4 10*3/uL (ref 3.8–10.8)

## 2018-09-10 LAB — COMPREHENSIVE METABOLIC PANEL
AG RATIO: 1.7 (calc) (ref 1.0–2.5)
ALBUMIN MSPROF: 4.4 g/dL (ref 3.6–5.1)
ALKALINE PHOSPHATASE (APISO): 66 U/L (ref 37–153)
ALT: 23 U/L (ref 6–29)
AST: 50 U/L — ABNORMAL HIGH (ref 10–35)
BILIRUBIN TOTAL: 0.6 mg/dL (ref 0.2–1.2)
BUN/Creatinine Ratio: 21 (calc) (ref 6–22)
BUN: 40 mg/dL — AB (ref 7–25)
CALCIUM: 9.2 mg/dL (ref 8.6–10.4)
CO2: 23 mmol/L (ref 20–32)
Chloride: 102 mmol/L (ref 98–110)
Creat: 1.9 mg/dL — ABNORMAL HIGH (ref 0.50–0.99)
GLOBULIN: 2.6 g/dL (ref 1.9–3.7)
GLUCOSE: 96 mg/dL (ref 65–99)
Potassium: 5.5 mmol/L — ABNORMAL HIGH (ref 3.5–5.3)
Sodium: 136 mmol/L (ref 135–146)
TOTAL PROTEIN: 7 g/dL (ref 6.1–8.1)

## 2018-09-10 LAB — T4, FREE: Free T4: 1 ng/dL (ref 0.8–1.8)

## 2018-09-10 LAB — T3, FREE: T3, Free: 2.2 pg/mL — ABNORMAL LOW (ref 2.3–4.2)

## 2018-10-03 ENCOUNTER — Other Ambulatory Visit: Payer: Self-pay | Admitting: Family Medicine

## 2018-11-28 ENCOUNTER — Other Ambulatory Visit: Payer: Self-pay | Admitting: Family Medicine

## 2019-01-31 ENCOUNTER — Other Ambulatory Visit: Payer: Self-pay | Admitting: Family Medicine

## 2019-03-11 ENCOUNTER — Ambulatory Visit (INDEPENDENT_AMBULATORY_CARE_PROVIDER_SITE_OTHER): Payer: Medicare Other | Admitting: Family Medicine

## 2019-03-11 ENCOUNTER — Other Ambulatory Visit: Payer: Self-pay

## 2019-03-11 ENCOUNTER — Encounter: Payer: Self-pay | Admitting: Family Medicine

## 2019-03-11 VITALS — BP 112/68 | HR 78 | Temp 98.1°F | Resp 14 | Ht 64.0 in | Wt 120.6 lb

## 2019-03-11 DIAGNOSIS — Z0001 Encounter for general adult medical examination with abnormal findings: Secondary | ICD-10-CM

## 2019-03-11 DIAGNOSIS — E782 Mixed hyperlipidemia: Secondary | ICD-10-CM

## 2019-03-11 DIAGNOSIS — D631 Anemia in chronic kidney disease: Secondary | ICD-10-CM | POA: Diagnosis not present

## 2019-03-11 DIAGNOSIS — Z0000 Encounter for general adult medical examination without abnormal findings: Secondary | ICD-10-CM

## 2019-03-11 DIAGNOSIS — N184 Chronic kidney disease, stage 4 (severe): Secondary | ICD-10-CM

## 2019-03-11 DIAGNOSIS — Z23 Encounter for immunization: Secondary | ICD-10-CM | POA: Diagnosis not present

## 2019-03-11 DIAGNOSIS — E039 Hypothyroidism, unspecified: Secondary | ICD-10-CM

## 2019-03-11 DIAGNOSIS — M816 Localized osteoporosis [Lequesne]: Secondary | ICD-10-CM | POA: Diagnosis not present

## 2019-03-11 DIAGNOSIS — F79 Unspecified intellectual disabilities: Secondary | ICD-10-CM

## 2019-03-11 MED ORDER — FENOFIBRATE 48 MG PO TABS: ORAL_TABLET | ORAL | 2 refills | Status: DC

## 2019-03-11 MED ORDER — OXYCODONE HCL 5 MG PO TABS: 2.5000 mg | ORAL_TABLET | Freq: Two times a day (BID) | ORAL | 0 refills | Status: DC | PRN

## 2019-03-11 MED ORDER — LEVOTHYROXINE SODIUM 50 MCG PO TABS: ORAL_TABLET | ORAL | 1 refills | Status: DC

## 2019-03-11 NOTE — Progress Notes (Signed)
Subjective:   Patient presents for Medicare Annual/Subsequent preventive examination.   No concerns  mEDS REVIEWED  Sister here who is POA  Review Past Medical/Family/Social: Per EMR    Risk Factors  Current exercise habits: walks  Dietary issues discussed: None,very picky eater   Cardiac risk factors: CKD, hyperlipidemia   Depression Screen  (Note: if answer to either of the following is "Yes", a more complete depression screening is indicated)  Over the past two weeks, have you felt down, depressed or hopeless? No Over the past two weeks, have you felt little interest or pleasure in doing things? No Have you lost interest or pleasure in daily life? No Do you often feel hopeless? No Do you cry easily over simple problems? No   Activities of Daily Living  In your present state of health, do you have any difficulty performing the following activities?:  Driving? No  Managing money? No  Feeding yourself? No  Getting from bed to chair? No  Climbing a flight of stairs? No  Preparing food and eating?: No  Bathing or showering? No  Getting dressed: No  Getting to the toilet? No  Using the toilet:No  Moving around from place to place: No  In the past year have you fallen or had a near fall?:No  Are you sexually active? No  Do you have more than one partner? No   Hearing Difficulties: No  Do you often ask people to speak up or repeat themselves? No  Do you experience ringing or noises in your ears? No Do you have difficulty understanding soft or whispered voices? No  Do you feel that you have a problem with memory? No Do you often misplace items? No  Do you feel safe at home? Yes  Cognitive Testing - Mental Retardation  Alert? Yes Normal Appearance?Yes  Oriented to person? Yes Place? Yes  Time? Yes  Recall of three objects? Yes  Can perform simple calculations? Yes  Displays appropriate judgment?Yes    List the Names of Other Physician/Practitioners you  currently use:   GYN- Dr. Ahmed Prima  Nephrology- Dr Moshe Cipro    Screening Tests / Date Colonoscopy       UTD              Zostavax Declines  Mammogram  UTD, had done this year  Influenza Vaccine - UTD Tetanus/tdapDue  Bone Density - Due GYN- Due   ROS: GEN- denies fatigue, fever, weight loss,weakness, recent illness HEENT- denies eye drainage, change in vision, nasal discharge, CVS- denies chest pain, palpitations RESP- denies SOB, cough, wheeze ABD- denies N/V, change in stools, abd pain GU- denies dysuria, hematuria, dribbling, incontinence MSK- denies joint pain, muscle aches, injury Neuro- denies headache, dizziness, syncope, seizure activity  PHYSICAL: Vitals reviewed  GEN- NAD, alert and oriented x3 HEENT- PERRL, EOMI, non injected sclera, pink conjunctiva, MMM, oropharynx clear Neck- Supple, no thryomegaly CVS- RRR, no murmur RESP-CTAB EXT- No edema Pulses- Radial, DP- 2+   Assessment:    Annual wellness medicare exam   Plan:    During the course of the visit the patient was educated and counseled about appropriate screening and preventive services including:   She is due for preventative procedures, but wishes to hold off until spring in setting of COVID-19, plan for mammogram,  Bone Density shingles, colonoscopy, she will schedule with GYn around that time  Chronic pain- oxycodone for severe pain as needed   CKD- she is not taking any NSAIDS, follows with nephrology  Hypothyroidism- due for recheck   Hyperlipidemia on tricor   Iron def anemia- continue iron supplement, recheck CBC  Flu shot given    Sister POA    FA//DEPRESSION/AUDIT C neg  Diet review for nutrition referral? Yes ____ Not Indicated __x__  Patient Instructions (the written plan) was given to the patient.  Medicare Attestation  I have personally reviewed:  The patient's medical and social history  Their use of alcohol, tobacco or illicit drugs  Their current medications and  supplements  The patient's functional ability including ADLs,fall risks, home safety risks, cognitive, and hearing and visual impairment  Diet and physical activities  Evidence for depression or mood disorders  The patient's weight, height, BMI, and visual acuity have been recorded in the chart. I have made referrals, counseling, and provided education to the patient based on review of the above and I have provided the patient with a written personalized care plan for preventive services.

## 2019-03-11 NOTE — Patient Instructions (Signed)
F/U 6 months  We will call with lab results  

## 2019-03-12 LAB — CBC WITH DIFFERENTIAL/PLATELET
Absolute Monocytes: 486 cells/uL (ref 200–950)
Basophils Absolute: 18 cells/uL (ref 0–200)
Basophils Relative: 0.4 %
Eosinophils Absolute: 99 cells/uL (ref 15–500)
Eosinophils Relative: 2.2 %
HCT: 27.6 % — ABNORMAL LOW (ref 35.0–45.0)
Hemoglobin: 9.3 g/dL — ABNORMAL LOW (ref 11.7–15.5)
Lymphs Abs: 1827 cells/uL (ref 850–3900)
MCH: 31 pg (ref 27.0–33.0)
MCHC: 33.7 g/dL (ref 32.0–36.0)
MCV: 92 fL (ref 80.0–100.0)
MPV: 10 fL (ref 7.5–12.5)
Monocytes Relative: 10.8 %
Neutro Abs: 2070 cells/uL (ref 1500–7800)
Neutrophils Relative %: 46 %
Platelets: 244 10*3/uL (ref 140–400)
RBC: 3 10*6/uL — ABNORMAL LOW (ref 3.80–5.10)
RDW: 12.5 % (ref 11.0–15.0)
Total Lymphocyte: 40.6 %
WBC: 4.5 10*3/uL (ref 3.8–10.8)

## 2019-03-12 LAB — COMPREHENSIVE METABOLIC PANEL
AG Ratio: 1.9 (calc) (ref 1.0–2.5)
ALT: 18 U/L (ref 6–29)
AST: 35 U/L (ref 10–35)
Albumin: 4.6 g/dL (ref 3.6–5.1)
Alkaline phosphatase (APISO): 48 U/L (ref 37–153)
BUN/Creatinine Ratio: 23 (calc) — ABNORMAL HIGH (ref 6–22)
BUN: 43 mg/dL — ABNORMAL HIGH (ref 7–25)
CO2: 24 mmol/L (ref 20–32)
Calcium: 9.3 mg/dL (ref 8.6–10.4)
Chloride: 103 mmol/L (ref 98–110)
Creat: 1.84 mg/dL — ABNORMAL HIGH (ref 0.50–0.99)
Globulin: 2.4 g/dL (calc) (ref 1.9–3.7)
Glucose, Bld: 81 mg/dL (ref 65–99)
Potassium: 5.1 mmol/L (ref 3.5–5.3)
Sodium: 136 mmol/L (ref 135–146)
Total Bilirubin: 0.5 mg/dL (ref 0.2–1.2)
Total Protein: 7 g/dL (ref 6.1–8.1)

## 2019-03-12 LAB — TSH: TSH: 11.23 mIU/L — ABNORMAL HIGH (ref 0.40–4.50)

## 2019-03-12 LAB — LIPID PANEL
Cholesterol: 167 mg/dL (ref <200)
HDL: 57 mg/dL (ref >=50)
LDL Cholesterol (Calc): 95 mg/dL (calc)
Non-HDL Cholesterol (Calc): 110 mg/dL (calc) (ref <130)
Total CHOL/HDL Ratio: 2.9 (calc) (ref <5.0)
Triglycerides: 65 mg/dL (ref <150)

## 2019-03-17 ENCOUNTER — Other Ambulatory Visit: Payer: Self-pay | Admitting: *Deleted

## 2019-03-17 MED ORDER — LEVOTHYROXINE SODIUM 50 MCG PO TABS: ORAL_TABLET | ORAL | 1 refills | Status: DC

## 2019-03-17 MED ORDER — LEVOTHYROXINE SODIUM 25 MCG PO CAPS: 12.5000 ug | ORAL_CAPSULE | Freq: Every day | ORAL | 11 refills | Status: DC

## 2019-06-04 DIAGNOSIS — G8929 Other chronic pain: Secondary | ICD-10-CM | POA: Diagnosis not present

## 2019-06-04 DIAGNOSIS — E039 Hypothyroidism, unspecified: Secondary | ICD-10-CM | POA: Diagnosis not present

## 2019-06-04 DIAGNOSIS — D509 Iron deficiency anemia, unspecified: Secondary | ICD-10-CM | POA: Diagnosis not present

## 2019-06-04 DIAGNOSIS — I959 Hypotension, unspecified: Secondary | ICD-10-CM | POA: Diagnosis not present

## 2019-06-04 DIAGNOSIS — N189 Chronic kidney disease, unspecified: Secondary | ICD-10-CM | POA: Diagnosis not present

## 2019-07-05 ENCOUNTER — Other Ambulatory Visit: Payer: Self-pay | Admitting: Family Medicine

## 2019-09-15 ENCOUNTER — Other Ambulatory Visit: Payer: Self-pay

## 2019-09-15 ENCOUNTER — Encounter: Payer: Self-pay | Admitting: Family Medicine

## 2019-09-15 ENCOUNTER — Ambulatory Visit (INDEPENDENT_AMBULATORY_CARE_PROVIDER_SITE_OTHER): Payer: Medicare HMO | Admitting: Family Medicine

## 2019-09-15 VITALS — BP 102/64 | HR 82 | Temp 98.4°F | Resp 14 | Ht 64.0 in | Wt 108.0 lb

## 2019-09-15 DIAGNOSIS — D631 Anemia in chronic kidney disease: Secondary | ICD-10-CM | POA: Diagnosis not present

## 2019-09-15 DIAGNOSIS — E782 Mixed hyperlipidemia: Secondary | ICD-10-CM

## 2019-09-15 DIAGNOSIS — M816 Localized osteoporosis [Lequesne]: Secondary | ICD-10-CM | POA: Diagnosis not present

## 2019-09-15 DIAGNOSIS — R634 Abnormal weight loss: Secondary | ICD-10-CM | POA: Diagnosis not present

## 2019-09-15 DIAGNOSIS — N184 Chronic kidney disease, stage 4 (severe): Secondary | ICD-10-CM | POA: Diagnosis not present

## 2019-09-15 DIAGNOSIS — E039 Hypothyroidism, unspecified: Secondary | ICD-10-CM | POA: Diagnosis not present

## 2019-09-15 LAB — CBC WITH DIFFERENTIAL/PLATELET
Absolute Monocytes: 476 cells/uL (ref 200–950)
Basophils Absolute: 18 cells/uL (ref 0–200)
Basophils Relative: 0.3 %
Eosinophils Absolute: 92 cells/uL (ref 15–500)
Eosinophils Relative: 1.5 %
HCT: 28.3 % — ABNORMAL LOW (ref 35.0–45.0)
Hemoglobin: 9.7 g/dL — ABNORMAL LOW (ref 11.7–15.5)
Lymphs Abs: 1915 cells/uL (ref 850–3900)
MCH: 31.7 pg (ref 27.0–33.0)
MCHC: 34.3 g/dL (ref 32.0–36.0)
MCV: 92.5 fL (ref 80.0–100.0)
MPV: 10.2 fL (ref 7.5–12.5)
Monocytes Relative: 7.8 %
Neutro Abs: 3599 cells/uL (ref 1500–7800)
Neutrophils Relative %: 59 %
Platelets: 215 10*3/uL (ref 140–400)
RBC: 3.06 10*6/uL — ABNORMAL LOW (ref 3.80–5.10)
RDW: 12.1 % (ref 11.0–15.0)
Total Lymphocyte: 31.4 %
WBC: 6.1 10*3/uL (ref 3.8–10.8)

## 2019-09-15 LAB — COMPREHENSIVE METABOLIC PANEL
AG Ratio: 1.6 (calc) (ref 1.0–2.5)
ALT: 21 U/L (ref 6–29)
AST: 40 U/L — ABNORMAL HIGH (ref 10–35)
Albumin: 4.4 g/dL (ref 3.6–5.1)
Alkaline phosphatase (APISO): 59 U/L (ref 37–153)
BUN/Creatinine Ratio: 22 (calc) (ref 6–22)
BUN: 53 mg/dL — ABNORMAL HIGH (ref 7–25)
CO2: 21 mmol/L (ref 20–32)
Calcium: 9.4 mg/dL (ref 8.6–10.4)
Chloride: 105 mmol/L (ref 98–110)
Creat: 2.41 mg/dL — ABNORMAL HIGH (ref 0.50–0.99)
Globulin: 2.7 g/dL (calc) (ref 1.9–3.7)
Glucose, Bld: 85 mg/dL (ref 65–99)
Potassium: 5 mmol/L (ref 3.5–5.3)
Sodium: 139 mmol/L (ref 135–146)
Total Bilirubin: 0.8 mg/dL (ref 0.2–1.2)
Total Protein: 7.1 g/dL (ref 6.1–8.1)

## 2019-09-15 LAB — LIPID PANEL
Cholesterol: 189 mg/dL (ref <200)
HDL: 48 mg/dL — ABNORMAL LOW (ref >=50)
LDL Cholesterol (Calc): 119 mg/dL (calc) — ABNORMAL HIGH
Non-HDL Cholesterol (Calc): 141 mg/dL (calc) — ABNORMAL HIGH (ref <130)
Total CHOL/HDL Ratio: 3.9 (calc) (ref <5.0)
Triglycerides: 116 mg/dL (ref <150)

## 2019-09-15 LAB — T3, FREE: T3, Free: 2.5 pg/mL (ref 2.3–4.2)

## 2019-09-15 LAB — T4, FREE: Free T4: 1.1 ng/dL (ref 0.8–1.8)

## 2019-09-15 LAB — TSH: TSH: 1.19 mIU/L (ref 0.40–4.50)

## 2019-09-15 MED ORDER — OXYCODONE HCL 5 MG PO TABS: 2.5000 mg | ORAL_TABLET | Freq: Two times a day (BID) | ORAL | 0 refills | Status: DC | PRN

## 2019-09-15 NOTE — Patient Instructions (Signed)
F/U 6 months for Physical  Schedule mammogram in May

## 2019-09-15 NOTE — Progress Notes (Signed)
   Subjective:    Patient ID: Shawna [name], female    DOB: 08/12/1955, 64 y.o.   MRN: [id number]  Patient presents for Follow-up (is fasting)  Patient here to follow-up chronic medical problems.  She is here with her sister who is her caregiver.  She does not have any concerns today.  States that her appetite is good her weight is always up and down.  She was down to 105 now up to 108 today.  She always feels bloated uncomfortable when her weight is near 120.  She is currently taking her thyroid medicine as prescribed 62.5 mg once a day.  She is due for recheck on her thyroid function studies.  She denies any symptoms of hypo or hyperthyroidism.   COVID 19 vaccine #1   Mammogram will be done in May, due to covid vaccination    Chronic renal failure she is avoiding NSAIDs.  She is due for recheck on her renal function.  She also has anemia associated with her renal disease.  She has not had any abnormal bleeding  He does have a corn which she is treating with over-the-counter corn and callus pads which has been helping.   Review Of Systems:  GEN- denies fatigue, fever, weight loss,weakness, recent illness HEENT- denies eye drainage, change in vision, nasal discharge, CVS- denies chest pain, palpitations RESP- denies SOB, cough, wheeze ABD- denies N/V, change in stools, abd pain GU- denies dysuria, hematuria, dribbling, incontinence MSK- denies joint pain, muscle aches, injury Neuro- denies headache, dizziness, syncope, seizure activity       Objective:    BP 102/64   Pulse 82   Temp 98.4 F (36.9 C) (Temporal)   Resp 14   Ht 5\' 4"  (1.626 m)   Wt 108 lb (49 kg)   BMI 18.54 kg/m  GEN- NAD, alert and oriented x3 HEENT- PERRL, EOMI, non injected sclera, pink conjunctiva, MMM, oropharynx clear Neck- Supple, no thyromegaly CVS- RRR, no murmur RESP-CTAB ABD-NABS,soft,NT,ND EXT- No edema , Right foot 5ht digit corn  Pulses- Radial, DP- 2+        Assessment & Plan:       Problem List Items Addressed This Visit      Unprioritized   Anemia   Relevant Orders   CBC with Differential/Platelet   Chronic kidney disease (CKD), stage IV (severe) (HCC)    Recheck renal function and Hb due to anemia of renal disease Avoiding NSAIDS      Hyperlipidemia    Continue tricor, check LFT       Relevant Orders   Comprehensive metabolic panel   Lipid panel   Hypothyroid - Primary    Recheck TFT , continue  62.5mg  once a day, will adjust as needed No symptoms of thyroid dysfunction      Relevant Orders   TSH   T3, free   T4, free   Loss of weight    Weight fluctuates a lot Appetite is good and no symptoms Will check thyroid labs ensure stable and not contributing       Osteoporosis    Continue calcium and D          Note: This dictation was prepared with Dragon dictation along with smaller phrase technology. Any transcriptional errors that result from this process are unintentional.

## 2019-09-15 NOTE — Assessment & Plan Note (Signed)
Weight fluctuates a lot Appetite is good and no symptoms Will check thyroid labs ensure stable and not contributing

## 2019-09-15 NOTE — Assessment & Plan Note (Signed)
Continue tricor, check LFT

## 2019-09-15 NOTE — Assessment & Plan Note (Signed)
Recheck TFT , continue  62.5mg  once a day, will adjust as needed No symptoms of thyroid dysfunction

## 2019-09-15 NOTE — Assessment & Plan Note (Signed)
Continue calcium and D

## 2019-09-15 NOTE — Assessment & Plan Note (Signed)
Recheck renal function and Hb due to anemia of renal disease Avoiding NSAIDS

## 2019-09-16 ENCOUNTER — Other Ambulatory Visit: Payer: Self-pay | Admitting: *Deleted

## 2019-09-16 DIAGNOSIS — N184 Chronic kidney disease, stage 4 (severe): Secondary | ICD-10-CM

## 2019-10-27 DIAGNOSIS — R69 Illness, unspecified: Secondary | ICD-10-CM | POA: Diagnosis not present

## 2019-11-10 ENCOUNTER — Encounter: Payer: Self-pay | Admitting: Family Medicine

## 2019-11-10 DIAGNOSIS — Z1231 Encounter for screening mammogram for malignant neoplasm of breast: Secondary | ICD-10-CM | POA: Diagnosis not present

## 2019-12-16 DIAGNOSIS — R69 Illness, unspecified: Secondary | ICD-10-CM | POA: Diagnosis not present

## 2019-12-31 ENCOUNTER — Other Ambulatory Visit: Payer: Self-pay | Admitting: Family Medicine

## 2020-03-16 DIAGNOSIS — R69 Illness, unspecified: Secondary | ICD-10-CM | POA: Diagnosis not present

## 2020-03-29 ENCOUNTER — Encounter: Payer: Self-pay | Admitting: Family Medicine

## 2020-03-29 ENCOUNTER — Ambulatory Visit (INDEPENDENT_AMBULATORY_CARE_PROVIDER_SITE_OTHER): Payer: Medicare HMO | Admitting: Family Medicine

## 2020-03-29 ENCOUNTER — Other Ambulatory Visit: Payer: Self-pay

## 2020-03-29 VITALS — BP 112/64 | HR 84 | Temp 97.6°F | Resp 14 | Ht 64.0 in | Wt 106.0 lb

## 2020-03-29 DIAGNOSIS — M816 Localized osteoporosis [Lequesne]: Secondary | ICD-10-CM

## 2020-03-29 DIAGNOSIS — Z23 Encounter for immunization: Secondary | ICD-10-CM | POA: Diagnosis not present

## 2020-03-29 DIAGNOSIS — E559 Vitamin D deficiency, unspecified: Secondary | ICD-10-CM | POA: Diagnosis not present

## 2020-03-29 DIAGNOSIS — E782 Mixed hyperlipidemia: Secondary | ICD-10-CM | POA: Diagnosis not present

## 2020-03-29 DIAGNOSIS — R69 Illness, unspecified: Secondary | ICD-10-CM | POA: Diagnosis not present

## 2020-03-29 DIAGNOSIS — D631 Anemia in chronic kidney disease: Secondary | ICD-10-CM

## 2020-03-29 DIAGNOSIS — N184 Chronic kidney disease, stage 4 (severe): Secondary | ICD-10-CM

## 2020-03-29 DIAGNOSIS — Z0001 Encounter for general adult medical examination with abnormal findings: Secondary | ICD-10-CM

## 2020-03-29 DIAGNOSIS — E039 Hypothyroidism, unspecified: Secondary | ICD-10-CM

## 2020-03-29 DIAGNOSIS — F79 Unspecified intellectual disabilities: Secondary | ICD-10-CM

## 2020-03-29 DIAGNOSIS — G894 Chronic pain syndrome: Secondary | ICD-10-CM

## 2020-03-29 DIAGNOSIS — Z0000 Encounter for general adult medical examination without abnormal findings: Secondary | ICD-10-CM

## 2020-03-29 MED ORDER — OXYCODONE HCL 5 MG PO TABS
2.5000 mg | ORAL_TABLET | Freq: Two times a day (BID) | ORAL | 0 refills | Status: DC | PRN
Start: 2020-03-29 — End: 2022-03-24

## 2020-03-29 NOTE — Patient Instructions (Addendum)
Call the kidney doctor  Bone Density schedule with next Mammogram  Cologuard  F/U 6 months

## 2020-03-29 NOTE — Progress Notes (Signed)
Subjective:   Patient presents for Medicare Annual/Subsequent preventive examination.  Pt here for CPE, meds reviewed Due for fasting labs Due for flu shot   Hypothyroidism, taking 62.5mg  daily   CKD Stage 4 - avoiding NSAIDS, Last Cr 2.4 in March , needs to F/U with Nephrology   Anemia of renal disease associated   No new concerns     Review Past Medical/Family/Social: Per EMR    Risk Factors Current exercise habits:walks Dietary issues discussed:None,very picky eater, she does drink boost some days   Cardiac risk factors:CKD, hyperlipidemia  Depression Screen (Note: if answer to either of the following is "Yes", a more complete depression screening is indicated)  Over the past two weeks, have you felt down, depressed or hopeless? No Over the past two weeks, have you felt little interest or pleasure in doing things? No Have you lost interest or pleasure in daily life? No Do you often feel hopeless? No Do you cry easily over simple problems?No   Activities of Daily Living In your present state of health, do you have any difficulty performing the following activities?:  Driving? Yes  Managing money? Yes  Feeding yourself? No  Getting from bed to chair? No  Climbing a flight of stairs? No  Preparing food and eating?: No  Bathing or showering? No  Getting dressed: No  Getting to the toilet? No  Using the toilet:No  Moving around from place to place: No  In the past year have you fallen or had a near fall?:No  Are you sexually active? No    Hearing Difficulties:No  Do you often ask people to speak up or repeat themselves? No  Do you experience ringing or noises in your ears? No Do you have difficulty understanding soft or whispered voices? No  Do you feel that you have a problem with memory? No Do you often misplace items? No  Do you feel safe at home?Yes  Cognitive Testing- Mental Retardation Alert? Yes Normal Appearance?Yes  Oriented to  person? Yes Place? Yes  Time? Yes  Recall of three objects? Yes  Can perform simple calculations? Yes  Displays appropriate judgment?Yes    List the Names of Other Physician/Practitioners you currently use: GYN- Dr. Ahmed Prima Nephrology- Dr Moshe Cipro    Screening Tests / Date ColonoscopyDue, last in 2009 ZostavaxDeclines MammogramUTD, had done this year Influenza Vaccine- DUE Tetanus/tdap Declines   Bone Density - Due GYN- Due  ROS: GEN- denies fatigue, fever, weight loss,weakness, recent illness HEENT- denies eye drainage, change in vision, nasal discharge, CVS- denies chest pain, palpitations RESP- denies SOB, cough, wheeze ABD- denies N/V, change in stools, abd pain GU- denies dysuria, hematuria, dribbling, incontinence MSK- denies joint pain, muscle aches, injury Neuro- denies headache, dizziness, syncope, seizure activity  PHYSICAL: Vitals reviewed  GEN- NAD, alert and oriented x3 HEENT- PERRL, EOMI, non injected sclera, pink conjunctiva, TM clear no effusion  Neck- Supple, no thryomegaly CVS- RRR, no murmur RESP-CTAB ABD-NABS,soft,NT,ND EXT- No edema Pulses- Radial, DP- 2+   Assessment:    Annual wellness medicare exam   Plan:    During the course of the visit the patient was educated and counseled about appropriate screening and preventive services including:   Prevention- Flu shot, Cologuard for colon cancer screening  declines further PAP Smears   Osteoporosis wants to defer bone density until next year with mammogram   Check vitamin D   Chronic pain- oxycodone for severe pain as needed   CKD- she is not taking any NSAIDS,  follows with nephrology due for visit in December, recheck renal function    Hypothyroidism- due for recheck   Hyperlipidemia on tricor ,recheck labs   Iron def anemia- continue iron supplement, recheck CBC  Flu shot given    Sister POA           Full code  Diet review for  nutrition referral? Yes ____ Not Indicated __x__  Patient Instructions (the written plan) was given to the patient.  Medicare Attestation  I have personally reviewed:  The patient's medical and social history  Their use of alcohol, tobacco or illicit drugs  Their current medications and supplements  The patient's functional ability including ADLs,fall risks, home safety risks, cognitive, and hearing and visual impairment  Diet and physical activities  Evidence for depression or mood disorders  The patient's weight, height, BMI, and visual acuity have been recorded in the chart. I have made referrals, counseling, and provided education to the patient based on review of the above and I have provided the patient with a written personalized care plan for preventive services.

## 2020-03-30 LAB — CBC WITH DIFFERENTIAL/PLATELET
Absolute Monocytes: 452 cells/uL (ref 200–950)
Basophils Absolute: 12 cells/uL (ref 0–200)
Basophils Relative: 0.3 %
Eosinophils Absolute: 80 cells/uL (ref 15–500)
Eosinophils Relative: 2 %
HCT: 25 % — ABNORMAL LOW (ref 35.0–45.0)
Hemoglobin: 8.4 g/dL — ABNORMAL LOW (ref 11.7–15.5)
Lymphs Abs: 1372 cells/uL (ref 850–3900)
MCH: 31.6 pg (ref 27.0–33.0)
MCHC: 33.6 g/dL (ref 32.0–36.0)
MCV: 94 fL (ref 80.0–100.0)
MPV: 10.3 fL (ref 7.5–12.5)
Monocytes Relative: 11.3 %
Neutro Abs: 2084 cells/uL (ref 1500–7800)
Neutrophils Relative %: 52.1 %
Platelets: 198 10*3/uL (ref 140–400)
RBC: 2.66 10*6/uL — ABNORMAL LOW (ref 3.80–5.10)
RDW: 12.7 % (ref 11.0–15.0)
Total Lymphocyte: 34.3 %
WBC: 4 10*3/uL (ref 3.8–10.8)

## 2020-03-30 LAB — COMPLETE METABOLIC PANEL WITH GFR
AG Ratio: 1.7 (calc) (ref 1.0–2.5)
ALT: 14 U/L (ref 6–29)
AST: 32 U/L (ref 10–35)
Albumin: 4 g/dL (ref 3.6–5.1)
Alkaline phosphatase (APISO): 71 U/L (ref 37–153)
BUN/Creatinine Ratio: 29 (calc) — ABNORMAL HIGH (ref 6–22)
BUN: 65 mg/dL — ABNORMAL HIGH (ref 7–25)
CO2: 20 mmol/L (ref 20–32)
Calcium: 8.7 mg/dL (ref 8.6–10.4)
Chloride: 108 mmol/L (ref 98–110)
Creat: 2.24 mg/dL — ABNORMAL HIGH (ref 0.50–0.99)
GFR, Est African American: 26 mL/min/{1.73_m2} — ABNORMAL LOW (ref >=60)
GFR, Est Non African American: 23 mL/min/{1.73_m2} — ABNORMAL LOW (ref >=60)
Globulin: 2.4 g/dL (calc) (ref 1.9–3.7)
Glucose, Bld: 79 mg/dL (ref 65–99)
Potassium: 5.4 mmol/L — ABNORMAL HIGH (ref 3.5–5.3)
Sodium: 138 mmol/L (ref 135–146)
Total Bilirubin: 0.6 mg/dL (ref 0.2–1.2)
Total Protein: 6.4 g/dL (ref 6.1–8.1)

## 2020-03-30 LAB — LIPID PANEL
Cholesterol: 148 mg/dL (ref <200)
HDL: 49 mg/dL — ABNORMAL LOW (ref >=50)
LDL Cholesterol (Calc): 83 mg/dL (calc)
Non-HDL Cholesterol (Calc): 99 mg/dL (calc) (ref <130)
Total CHOL/HDL Ratio: 3 (calc) (ref <5.0)
Triglycerides: 84 mg/dL (ref <150)

## 2020-03-30 LAB — VITAMIN D 25 HYDROXY (VIT D DEFICIENCY, FRACTURES): Vit D, 25-Hydroxy: 29 ng/mL — ABNORMAL LOW (ref 30–100)

## 2020-03-30 LAB — TSH: TSH: 2.92 mIU/L (ref 0.40–4.50)

## 2020-03-30 LAB — T4, FREE: Free T4: 1 ng/dL (ref 0.8–1.8)

## 2020-03-30 LAB — T3, FREE: T3, Free: 2.5 pg/mL (ref 2.3–4.2)

## 2020-03-31 ENCOUNTER — Other Ambulatory Visit: Payer: Self-pay | Admitting: *Deleted

## 2020-03-31 MED ORDER — FENOFIBRATE 48 MG PO TABS: ORAL_TABLET | ORAL | 2 refills | Status: AC

## 2020-03-31 MED ORDER — LEVOTHYROXINE SODIUM 25 MCG PO TABS: ORAL_TABLET | ORAL | 1 refills | Status: DC

## 2020-03-31 MED ORDER — LEVOTHYROXINE SODIUM 50 MCG PO TABS: ORAL_TABLET | ORAL | 1 refills | Status: DC

## 2020-03-31 NOTE — Telephone Encounter (Signed)
Received call from patient sister, Shawna [name].   Requested refills on all routine medications and Oxycodone.   Routine medications sent to pharmacy. PCP has filled Oxycodone.

## 2020-04-09 ENCOUNTER — Telehealth: Payer: Self-pay | Admitting: *Deleted

## 2020-04-09 NOTE — Telephone Encounter (Signed)
Received verbal orders for Cologuard.   Order placed via [facility].   Cologuard (Order [id number])

## 2020-08-20 ENCOUNTER — Telehealth: Payer: Self-pay

## 2020-08-20 NOTE — Telephone Encounter (Signed)
Per Dr Posey Pronto we will not be able to take this patient.

## 2020-09-15 ENCOUNTER — Telehealth: Payer: Self-pay

## 2020-09-15 MED ORDER — LEVOTHYROXINE SODIUM 25 MCG PO TABS: ORAL_TABLET | ORAL | 0 refills | Status: DC

## 2020-09-15 MED ORDER — LEVOTHYROXINE SODIUM 50 MCG PO TABS: ORAL_TABLET | ORAL | 0 refills | Status: DC

## 2020-09-15 NOTE — Telephone Encounter (Signed)
Prescription sent to pharmacy.

## 2020-09-15 NOTE — Telephone Encounter (Signed)
Patient called need med refill  levothyroxine (SYNTHROID) 25 MCG tablet  levothyroxine (SYNTHROID) 50 MCG tablet  [facility] - [place], [place] - [address]  [place], [place] [address]  Phone:  [phone] Fax:  [phone]

## 2020-09-21 ENCOUNTER — Ambulatory Visit: Payer: Medicare HMO | Admitting: Family Medicine

## 2020-09-22 DIAGNOSIS — Z0189 Encounter for other specified special examinations: Secondary | ICD-10-CM | POA: Diagnosis not present

## 2020-09-22 DIAGNOSIS — E039 Hypothyroidism, unspecified: Secondary | ICD-10-CM | POA: Diagnosis not present

## 2020-09-22 DIAGNOSIS — E782 Mixed hyperlipidemia: Secondary | ICD-10-CM | POA: Diagnosis not present

## 2020-09-29 DIAGNOSIS — R69 Illness, unspecified: Secondary | ICD-10-CM | POA: Diagnosis not present

## 2020-09-29 DIAGNOSIS — E039 Hypothyroidism, unspecified: Secondary | ICD-10-CM | POA: Diagnosis not present

## 2020-09-29 DIAGNOSIS — D509 Iron deficiency anemia, unspecified: Secondary | ICD-10-CM | POA: Diagnosis not present

## 2020-09-29 DIAGNOSIS — G8929 Other chronic pain: Secondary | ICD-10-CM | POA: Diagnosis not present

## 2020-09-29 DIAGNOSIS — N189 Chronic kidney disease, unspecified: Secondary | ICD-10-CM | POA: Diagnosis not present

## 2020-09-29 DIAGNOSIS — I959 Hypotension, unspecified: Secondary | ICD-10-CM | POA: Diagnosis not present

## 2020-10-05 DIAGNOSIS — E039 Hypothyroidism, unspecified: Secondary | ICD-10-CM | POA: Diagnosis not present

## 2020-10-05 DIAGNOSIS — E782 Mixed hyperlipidemia: Secondary | ICD-10-CM | POA: Diagnosis not present

## 2020-10-05 DIAGNOSIS — Z131 Encounter for screening for diabetes mellitus: Secondary | ICD-10-CM | POA: Diagnosis not present

## 2020-10-05 DIAGNOSIS — R7303 Prediabetes: Secondary | ICD-10-CM | POA: Diagnosis not present

## 2020-10-05 DIAGNOSIS — N184 Chronic kidney disease, stage 4 (severe): Secondary | ICD-10-CM | POA: Diagnosis not present

## 2020-10-12 DIAGNOSIS — E782 Mixed hyperlipidemia: Secondary | ICD-10-CM | POA: Diagnosis not present

## 2020-10-12 DIAGNOSIS — E039 Hypothyroidism, unspecified: Secondary | ICD-10-CM | POA: Diagnosis not present

## 2020-10-12 DIAGNOSIS — N1832 Chronic kidney disease, stage 3b: Secondary | ICD-10-CM | POA: Diagnosis not present

## 2020-10-12 DIAGNOSIS — D508 Other iron deficiency anemias: Secondary | ICD-10-CM | POA: Diagnosis not present

## 2020-11-10 DIAGNOSIS — Z1231 Encounter for screening mammogram for malignant neoplasm of breast: Secondary | ICD-10-CM | POA: Diagnosis not present

## 2020-11-25 ENCOUNTER — Other Ambulatory Visit: Payer: Self-pay | Admitting: Family Medicine

## 2021-02-23 ENCOUNTER — Other Ambulatory Visit: Payer: Self-pay | Admitting: Family Medicine

## 2021-04-07 DIAGNOSIS — E782 Mixed hyperlipidemia: Secondary | ICD-10-CM | POA: Diagnosis not present

## 2021-04-07 DIAGNOSIS — E039 Hypothyroidism, unspecified: Secondary | ICD-10-CM | POA: Diagnosis not present

## 2021-04-11 DIAGNOSIS — N183 Chronic kidney disease, stage 3 unspecified: Secondary | ICD-10-CM | POA: Diagnosis not present

## 2021-04-11 DIAGNOSIS — D631 Anemia in chronic kidney disease: Secondary | ICD-10-CM | POA: Diagnosis not present

## 2021-04-11 DIAGNOSIS — D649 Anemia, unspecified: Secondary | ICD-10-CM | POA: Diagnosis not present

## 2021-04-11 DIAGNOSIS — E039 Hypothyroidism, unspecified: Secondary | ICD-10-CM | POA: Diagnosis not present

## 2021-04-11 DIAGNOSIS — Z23 Encounter for immunization: Secondary | ICD-10-CM | POA: Diagnosis not present

## 2021-04-11 DIAGNOSIS — E782 Mixed hyperlipidemia: Secondary | ICD-10-CM | POA: Diagnosis not present

## 2021-10-06 DIAGNOSIS — D649 Anemia, unspecified: Secondary | ICD-10-CM | POA: Diagnosis not present

## 2021-10-06 DIAGNOSIS — N183 Chronic kidney disease, stage 3 unspecified: Secondary | ICD-10-CM | POA: Diagnosis not present

## 2021-10-06 DIAGNOSIS — E039 Hypothyroidism, unspecified: Secondary | ICD-10-CM | POA: Diagnosis not present

## 2021-10-10 DIAGNOSIS — E782 Mixed hyperlipidemia: Secondary | ICD-10-CM | POA: Diagnosis not present

## 2021-10-10 DIAGNOSIS — N183 Chronic kidney disease, stage 3 unspecified: Secondary | ICD-10-CM | POA: Diagnosis not present

## 2021-10-10 DIAGNOSIS — E039 Hypothyroidism, unspecified: Secondary | ICD-10-CM | POA: Diagnosis not present

## 2021-10-10 DIAGNOSIS — E0590 Thyrotoxicosis, unspecified without thyrotoxic crisis or storm: Secondary | ICD-10-CM | POA: Diagnosis not present

## 2021-10-10 DIAGNOSIS — Z13820 Encounter for screening for osteoporosis: Secondary | ICD-10-CM | POA: Diagnosis not present

## 2021-10-10 DIAGNOSIS — D649 Anemia, unspecified: Secondary | ICD-10-CM | POA: Diagnosis not present

## 2021-10-10 DIAGNOSIS — Z0001 Encounter for general adult medical examination with abnormal findings: Secondary | ICD-10-CM | POA: Diagnosis not present

## 2021-11-16 DIAGNOSIS — Z780 Asymptomatic menopausal state: Secondary | ICD-10-CM | POA: Diagnosis not present

## 2021-11-16 DIAGNOSIS — M8589 Other specified disorders of bone density and structure, multiple sites: Secondary | ICD-10-CM | POA: Diagnosis not present

## 2021-11-16 DIAGNOSIS — Z1231 Encounter for screening mammogram for malignant neoplasm of breast: Secondary | ICD-10-CM | POA: Diagnosis not present

## 2022-03-20 ENCOUNTER — Encounter (HOSPITAL_COMMUNITY): Payer: Self-pay | Admitting: Emergency Medicine

## 2022-03-20 ENCOUNTER — Inpatient Hospital Stay (HOSPITAL_COMMUNITY)
Admission: EM | Admit: 2022-03-20 | Discharge: 2022-03-24 | DRG: 312 | Disposition: A | Discharge status: Skilled Nursing Facility | Payer: Medicare HMO | Attending: Internal Medicine | Admitting: Internal Medicine

## 2022-03-20 ENCOUNTER — Other Ambulatory Visit: Payer: Self-pay

## 2022-03-20 DIAGNOSIS — E872 Acidosis, unspecified: Secondary | ICD-10-CM | POA: Diagnosis present

## 2022-03-20 DIAGNOSIS — R5381 Other malaise: Secondary | ICD-10-CM | POA: Diagnosis present

## 2022-03-20 DIAGNOSIS — N184 Chronic kidney disease, stage 4 (severe): Secondary | ICD-10-CM | POA: Diagnosis present

## 2022-03-20 DIAGNOSIS — D649 Anemia, unspecified: Secondary | ICD-10-CM | POA: Diagnosis present

## 2022-03-20 DIAGNOSIS — M47816 Spondylosis without myelopathy or radiculopathy, lumbar region: Secondary | ICD-10-CM | POA: Diagnosis present

## 2022-03-20 DIAGNOSIS — M810 Age-related osteoporosis without current pathological fracture: Secondary | ICD-10-CM | POA: Diagnosis present

## 2022-03-20 DIAGNOSIS — Z7982 Long term (current) use of aspirin: Secondary | ICD-10-CM

## 2022-03-20 DIAGNOSIS — Z79899 Other long term (current) drug therapy: Secondary | ICD-10-CM

## 2022-03-20 DIAGNOSIS — Z79890 Hormone replacement therapy (postmenopausal): Secondary | ICD-10-CM

## 2022-03-20 DIAGNOSIS — I951 Orthostatic hypotension: Principal | ICD-10-CM

## 2022-03-20 DIAGNOSIS — E039 Hypothyroidism, unspecified: Secondary | ICD-10-CM | POA: Diagnosis present

## 2022-03-20 DIAGNOSIS — R339 Retention of urine, unspecified: Secondary | ICD-10-CM | POA: Diagnosis not present

## 2022-03-20 DIAGNOSIS — E785 Hyperlipidemia, unspecified: Secondary | ICD-10-CM | POA: Diagnosis present

## 2022-03-20 DIAGNOSIS — R42 Dizziness and giddiness: Secondary | ICD-10-CM | POA: Diagnosis not present

## 2022-03-20 DIAGNOSIS — R627 Adult failure to thrive: Secondary | ICD-10-CM | POA: Diagnosis present

## 2022-03-20 DIAGNOSIS — F79 Unspecified intellectual disabilities: Secondary | ICD-10-CM | POA: Diagnosis present

## 2022-03-20 DIAGNOSIS — G8929 Other chronic pain: Secondary | ICD-10-CM | POA: Diagnosis present

## 2022-03-20 DIAGNOSIS — G319 Degenerative disease of nervous system, unspecified: Secondary | ICD-10-CM | POA: Diagnosis present

## 2022-03-20 LAB — BASIC METABOLIC PANEL
Anion gap: 9 (ref 5–15)
BUN: 53 mg/dL — ABNORMAL HIGH (ref 8–23)
CO2: 19 mmol/L — ABNORMAL LOW (ref 22–32)
Calcium: 7.8 mg/dL — ABNORMAL LOW (ref 8.9–10.3)
Chloride: 110 mmol/L (ref 98–111)
Creatinine, Ser: 2.36 mg/dL — ABNORMAL HIGH (ref 0.44–1.00)
GFR, Estimated: 22 mL/min — ABNORMAL LOW (ref >60)
Glucose, Bld: 111 mg/dL — ABNORMAL HIGH (ref 70–99)
Potassium: 4.1 mmol/L (ref 3.5–5.1)
Sodium: 138 mmol/L (ref 135–145)

## 2022-03-20 LAB — CBC
HCT: 27 % — ABNORMAL LOW (ref 36.0–46.0)
Hemoglobin: 9 g/dL — ABNORMAL LOW (ref 12.0–15.0)
MCH: 32.4 pg (ref 26.0–34.0)
MCHC: 33.3 g/dL (ref 30.0–36.0)
MCV: 97.1 fL (ref 80.0–100.0)
Platelets: 227 10*3/uL (ref 150–400)
RBC: 2.78 MIL/uL — ABNORMAL LOW (ref 3.87–5.11)
RDW: 12.9 % (ref 11.5–15.5)
WBC: 5.3 10*3/uL (ref 4.0–10.5)
nRBC: 0 % (ref 0.0–0.2)

## 2022-03-20 MED ORDER — SODIUM CHLORIDE 0.9 % IV BOLUS
1000.0000 mL | Freq: Once | INTRAVENOUS | Status: AC
  Administered 2022-03-20: 1000 mL via INTRAVENOUS

## 2022-03-20 NOTE — ED Triage Notes (Signed)
Pt to the ED from PCP office with dizziness.  Pt was given a liter of fluid in the office with no improvement.  Pt has stage 4 kidney disease.

## 2022-03-20 NOTE — ED Provider Notes (Signed)
[facility] EMERGENCY DEPARTMENT Provider Note   CSN: [id number] Arrival date & time: 03/20/22  1538     History  Chief Complaint  Patient presents with   Dizziness    Shawna [name] is a 66 y.o. female with a history including hypothyroidism, hyperlipidemia and chronic kidney disease stage IV, also iron deficiency anemia and intellectual disability presenting for evaluation of lightheadedness.  She describes feeling lightheaded with positional changes which started over the past 2 to 3 days.  She reports similar symptoms in the past which was thought to be secondary to dehydration and she endorses she has not been drinking fluids as much as she should.  She was seen by her PCP today in the office where she received a liter of fluid but she still felt lightheaded and was advised to come to the ED.  She denies chest pain, shortness of breath, has had no nausea or vomiting, denies diarrhea or decreased urinary frequency.  She also denies headaches, focal weakness, vision changes or other complaints.  The history is provided by the patient.       Home Medications Prior to Admission medications   Medication Sig Start Date End Date Taking? Authorizing Provider  aspirin 81 MG tablet Take 81 mg by mouth daily.      [provider]  calcium gluconate 500 MG tablet Take 500 mg by mouth 2 (two) times daily.      [provider]  fenofibrate (TRICOR) 48 MG tablet TAKE ONE (1) TABLET BY MOUTH EVERY DAY FOR CHOLESTEROL 03/31/20   [place], Modena Nunnery, MD  iron polysaccharides (NU-IRON) 150 MG capsule Take 1 capsule (150 mg total) by mouth daily. 10/06/16   Alycia Rossetti, MD  levothyroxine (SYNTHROID) 25 MCG tablet TAKE ONE-HALF TABLET BY MOUTH ALONG WITH50MCG DAILY TO GIVE A TOTAL OF 62.5MCG 11/25/20   Susy Frizzle, MD  levothyroxine (SYNTHROID) 50 MCG tablet TAKE ONE TABLET BY MOUTH ALONG WITH ONE-HALF TO THE 25MCG TO EQUAL 62.5MCG EVERY MORNING 11/25/20   Susy Frizzle, MD   Multiple Vitamins-Minerals (CENTRUM SILVER) CHEW Chew 1 tablet by mouth daily.      [provider]  oxyCODONE (OXY IR/ROXICODONE) 5 MG immediate release tablet Take 0.5 tablets (2.5 mg total) by mouth 2 (two) times daily as needed. 03/29/20   Alycia Rossetti, MD      Allergies    Bisphosphonates    Review of Systems   Review of Systems  Constitutional:  Negative for fever.  HENT:  Negative for congestion and sore throat.   Eyes: Negative.   Respiratory:  Negative for chest tightness and shortness of breath.   Cardiovascular:  Negative for chest pain.  Gastrointestinal:  Negative for abdominal pain, diarrhea, nausea and vomiting.  Genitourinary: Negative.  Negative for dysuria.  Musculoskeletal:  Negative for arthralgias, joint swelling and neck pain.  Skin: Negative.  Negative for rash and wound.  Neurological:  Positive for light-headedness. Negative for dizziness, weakness, numbness and headaches.  Psychiatric/Behavioral: Negative.    All other systems reviewed and are negative.   Physical Exam Updated Vital Signs BP (!) 125/92   Pulse 79   Temp 98.2 F (36.8 C) (Oral)   Resp 16   Ht 5\' 4"  (1.626 m)   Wt 48.1 kg   SpO2 100%   BMI 18.20 kg/m  Physical Exam Vitals and nursing note reviewed.  Constitutional:      Appearance: She is well-developed.  HENT:     Head: Normocephalic  and atraumatic.  Eyes:     Conjunctiva/sclera: Conjunctivae normal.  Cardiovascular:     Rate and Rhythm: Normal rate and regular rhythm.     Heart sounds: Normal heart sounds.  Pulmonary:     Effort: Pulmonary effort is normal.     Breath sounds: Normal breath sounds. No wheezing.  Abdominal:     General: Bowel sounds are normal.     Palpations: Abdomen is soft.     Tenderness: There is no abdominal tenderness.  Musculoskeletal:        General: Normal range of motion.     Cervical back: Normal range of motion.  Skin:    General: Skin is warm and dry.  Neurological:      General: No focal deficit present.     Mental Status: She is alert.     ED Results / Procedures / Treatments   Labs (all labs ordered are listed, but only abnormal results are displayed) Labs Reviewed  BASIC METABOLIC PANEL - Abnormal; Notable for the following components:      Result Value   CO2 19 (*)    Glucose, Bld 111 (*)    BUN 53 (*)    Creatinine, Ser 2.36 (*)    Calcium 7.8 (*)    GFR, Estimated 22 (*)    All other components within normal limits  CBC - Abnormal; Notable for the following components:   RBC 2.78 (*)    Hemoglobin 9.0 (*)    HCT 27.0 (*)    All other components within normal limits  URINALYSIS, ROUTINE W REFLEX MICROSCOPIC    EKG EKG Interpretation  Date/Time:  Monday March 20 2022 16:02:17 EDT Ventricular Rate:  87 PR Interval:  176 QRS Duration: 66 QT Interval:  342 QTC Calculation: 411 R Axis:   55 Text Interpretation: Normal sinus rhythm Normal ECG When compared with ECG of 25-Dec-2014 18:36, No significant change was found No acute changes No significant change since last tracing Confirmed by Varney Biles [phone]) on 03/20/2022 10:16:27 PM  Radiology No results found.  Procedures Procedures    Medications Ordered in ED Medications  sodium chloride 0.9 % bolus 1,000 mL (1,000 mLs Intravenous New Bag/Given 03/20/22 2237)  sodium chloride 0.9 % bolus 1,000 mL (1,000 mLs Intravenous New Bag/Given 03/20/22 2343)    ED Course/ Medical Decision Making/ A&P                           Medical Decision Making Patient presenting with lightheadedness and apparent significant dehydration, past medical history significant for chronic kidney disease stage IV.  Treated with IV fluids by PCP prior to arrival here.  She remains orthostatic upon first arrival blood pressure and pulse supine is 151/91 with a pulse rate of 81, standing she dropped to 93/67 with a pulse rate of 113.  IV fluids 2 L normal saline have been ordered.   Call placed to  [name], pt's sister who brought pt here and is her primary caregiver. She has episodes where she feels is gaining weight, despite weight loss and will deliberately cut back on intake.  Has lost weight recently due to this.  States she has been on medication (mididrine ??) in the past to help boost her blood pressures but not in recent years.    Currently pending completion of IV fluids, urinalysis, recheck orthostatic VS.  Probable dispo home if stable at that time.  Dr Sedonia Small assumes care.  Amount and/or Complexity of Data Reviewed Independent Historian: caregiver    Details: Per above Labs: ordered.    Details: Significant labs including a hemoglobin of 9, this is relatively stable in comparison to hemoglobin of 8.41-year ago.  She also has a creatinine of 2.36 today and a BUN of 53, her creatinine 1 year ago was 2.24.           Final Clinical Impression(s) / ED Diagnoses Final diagnoses:  None    Rx / DC Orders ED Discharge Orders     None         Landis Martins 03/21/22 5331    Varney Biles, MD 03/21/22 1840

## 2022-03-20 NOTE — ED Provider Notes (Incomplete)
[facility] EMERGENCY DEPARTMENT Provider Note   CSN: [id number] Arrival date & time: 03/20/22  1538     History {Add pertinent medical, surgical, social history, OB history to HPI:1} Chief Complaint  Patient presents with  . Dizziness    Shawna [name] is a 66 y.o. female with a history including hypothyroidism, hyperlipidemia and chronic kidney disease stage IV, also iron deficiency anemia presenting for evaluation of lightheadedness.  She describes feeling lightheaded with positional changes which started over the past 2 to 3 days.  She reports similar symptoms in the past which was thought to be secondary to dehydration and she endorses she has not been drinking fluids as much as she should.  She was seen by her PCP today in the office where she received a liter of fluid but she still felt lightheaded and was advised to come to the ED.  She denies chest pain, shortness of breath, has had no nausea or vomiting, denies diarrhea or decreased urinary frequency.  She also denies headaches, focal weakness, vision changes or other complaints.  The history is provided by the patient.       Home Medications Prior to Admission medications   Medication Sig Start Date End Date Taking? Authorizing Provider  aspirin 81 MG tablet Take 81 mg by mouth daily.      [provider]  calcium gluconate 500 MG tablet Take 500 mg by mouth 2 (two) times daily.      [provider]  fenofibrate (TRICOR) 48 MG tablet TAKE ONE (1) TABLET BY MOUTH EVERY DAY FOR CHOLESTEROL 03/31/20   [place], Modena Nunnery, MD  iron polysaccharides (NU-IRON) 150 MG capsule Take 1 capsule (150 mg total) by mouth daily. 10/06/16   Alycia Rossetti, MD  levothyroxine (SYNTHROID) 25 MCG tablet TAKE ONE-HALF TABLET BY MOUTH ALONG WITH50MCG DAILY TO GIVE A TOTAL OF 62.5MCG 11/25/20   Susy Frizzle, MD  levothyroxine (SYNTHROID) 50 MCG tablet TAKE ONE TABLET BY MOUTH ALONG WITH ONE-HALF TO THE 25MCG TO EQUAL 62.5MCG EVERY  MORNING 11/25/20   Susy Frizzle, MD  Multiple Vitamins-Minerals (CENTRUM SILVER) CHEW Chew 1 tablet by mouth daily.      [provider]  oxyCODONE (OXY IR/ROXICODONE) 5 MG immediate release tablet Take 0.5 tablets (2.5 mg total) by mouth 2 (two) times daily as needed. 03/29/20   Alycia Rossetti, MD      Allergies    Bisphosphonates    Review of Systems   Review of Systems  Constitutional:  Negative for fever.  HENT:  Negative for congestion and sore throat.   Eyes: Negative.   Respiratory:  Negative for chest tightness and shortness of breath.   Cardiovascular:  Negative for chest pain.  Gastrointestinal:  Negative for abdominal pain, diarrhea, nausea and vomiting.  Genitourinary: Negative.  Negative for dysuria.  Musculoskeletal:  Negative for arthralgias, joint swelling and neck pain.  Skin: Negative.  Negative for rash and wound.  Neurological:  Positive for light-headedness. Negative for dizziness, weakness, numbness and headaches.  Psychiatric/Behavioral: Negative.    All other systems reviewed and are negative.   Physical Exam Updated Vital Signs BP (!) 125/92   Pulse 79   Temp 98.2 F (36.8 C) (Oral)   Resp 16   Ht 5\' 4"  (1.626 m)   Wt 48.1 kg   SpO2 100%   BMI 18.20 kg/m  Physical Exam Vitals and nursing note reviewed.  Constitutional:      Appearance: She is well-developed.  HENT:  Head: Normocephalic and atraumatic.  Eyes:     Conjunctiva/sclera: Conjunctivae normal.  Cardiovascular:     Rate and Rhythm: Normal rate and regular rhythm.     Heart sounds: Normal heart sounds.  Pulmonary:     Effort: Pulmonary effort is normal.     Breath sounds: Normal breath sounds. No wheezing.  Abdominal:     General: Bowel sounds are normal.     Palpations: Abdomen is soft.     Tenderness: There is no abdominal tenderness.  Musculoskeletal:        General: Normal range of motion.     Cervical back: Normal range of motion.  Skin:    General: Skin  is warm and dry.  Neurological:     General: No focal deficit present.     Mental Status: She is alert.     ED Results / Procedures / Treatments   Labs (all labs ordered are listed, but only abnormal results are displayed) Labs Reviewed  BASIC METABOLIC PANEL - Abnormal; Notable for the following components:      Result Value   CO2 19 (*)    Glucose, Bld 111 (*)    BUN 53 (*)    Creatinine, Ser 2.36 (*)    Calcium 7.8 (*)    GFR, Estimated 22 (*)    All other components within normal limits  CBC - Abnormal; Notable for the following components:   RBC 2.78 (*)    Hemoglobin 9.0 (*)    HCT 27.0 (*)    All other components within normal limits  URINALYSIS, ROUTINE W REFLEX MICROSCOPIC    EKG EKG Interpretation  Date/Time:  Monday March 20 2022 16:02:17 EDT Ventricular Rate:  87 PR Interval:  176 QRS Duration: 66 QT Interval:  342 QTC Calculation: 411 R Axis:   55 Text Interpretation: Normal sinus rhythm Normal ECG When compared with ECG of 25-Dec-2014 18:36, No significant change was found No acute changes No significant change since last tracing Confirmed by Varney Biles [phone]) on 03/20/2022 10:16:27 PM  Radiology No results found.  Procedures Procedures  {Document cardiac monitor, telemetry assessment procedure when appropriate:1}  Medications Ordered in ED Medications  sodium chloride 0.9 % bolus 1,000 mL (1,000 mLs Intravenous New Bag/Given 03/20/22 2237)  sodium chloride 0.9 % bolus 1,000 mL (1,000 mLs Intravenous New Bag/Given 03/20/22 2343)    ED Course/ Medical Decision Making/ A&P                           Medical Decision Making Patient presenting with lightheadedness and apparent significant dehydration, past medical history significant for chronic kidney disease stage IV.  Treated with IV fluids by PCP prior to arrival here.  She remains orthostatic  Amount and/or Complexity of Data Reviewed Labs: ordered.     {Document critical care time  when appropriate:1} {Document review of labs and clinical decision tools ie heart score, Chads2Vasc2 etc:1}  {Document your independent review of radiology images, and any outside records:1} {Document your discussion with family members, caretakers, and with consultants:1} {Document social determinants of health affecting pt's care:1} {Document your decision making why or why not admission, treatments were needed:1} Final Clinical Impression(s) / ED Diagnoses Final diagnoses:  None    Rx / DC Orders ED Discharge Orders     None

## 2022-03-21 ENCOUNTER — Emergency Department (HOSPITAL_COMMUNITY): Payer: Medicare HMO

## 2022-03-21 DIAGNOSIS — M545 Low back pain, unspecified: Secondary | ICD-10-CM | POA: Diagnosis not present

## 2022-03-21 DIAGNOSIS — N184 Chronic kidney disease, stage 4 (severe): Secondary | ICD-10-CM | POA: Diagnosis not present

## 2022-03-21 DIAGNOSIS — R627 Adult failure to thrive: Secondary | ICD-10-CM

## 2022-03-21 DIAGNOSIS — E872 Acidosis, unspecified: Secondary | ICD-10-CM

## 2022-03-21 DIAGNOSIS — K6389 Other specified diseases of intestine: Secondary | ICD-10-CM | POA: Diagnosis not present

## 2022-03-21 DIAGNOSIS — R5381 Other malaise: Secondary | ICD-10-CM | POA: Diagnosis not present

## 2022-03-21 DIAGNOSIS — Z79899 Other long term (current) drug therapy: Secondary | ICD-10-CM | POA: Diagnosis not present

## 2022-03-21 DIAGNOSIS — E039 Hypothyroidism, unspecified: Secondary | ICD-10-CM | POA: Diagnosis not present

## 2022-03-21 DIAGNOSIS — G8929 Other chronic pain: Secondary | ICD-10-CM | POA: Diagnosis not present

## 2022-03-21 DIAGNOSIS — D649 Anemia, unspecified: Secondary | ICD-10-CM | POA: Diagnosis not present

## 2022-03-21 DIAGNOSIS — M48061 Spinal stenosis, lumbar region without neurogenic claudication: Secondary | ICD-10-CM | POA: Diagnosis not present

## 2022-03-21 DIAGNOSIS — R339 Retention of urine, unspecified: Secondary | ICD-10-CM | POA: Diagnosis not present

## 2022-03-21 DIAGNOSIS — M810 Age-related osteoporosis without current pathological fracture: Secondary | ICD-10-CM | POA: Diagnosis not present

## 2022-03-21 DIAGNOSIS — M47816 Spondylosis without myelopathy or radiculopathy, lumbar region: Secondary | ICD-10-CM | POA: Diagnosis not present

## 2022-03-21 DIAGNOSIS — Z79890 Hormone replacement therapy (postmenopausal): Secondary | ICD-10-CM | POA: Diagnosis not present

## 2022-03-21 DIAGNOSIS — F79 Unspecified intellectual disabilities: Secondary | ICD-10-CM | POA: Diagnosis not present

## 2022-03-21 DIAGNOSIS — I951 Orthostatic hypotension: Secondary | ICD-10-CM | POA: Diagnosis not present

## 2022-03-21 DIAGNOSIS — Z7982 Long term (current) use of aspirin: Secondary | ICD-10-CM | POA: Diagnosis not present

## 2022-03-21 DIAGNOSIS — E785 Hyperlipidemia, unspecified: Secondary | ICD-10-CM | POA: Diagnosis not present

## 2022-03-21 DIAGNOSIS — M8588 Other specified disorders of bone density and structure, other site: Secondary | ICD-10-CM | POA: Diagnosis not present

## 2022-03-21 DIAGNOSIS — G319 Degenerative disease of nervous system, unspecified: Secondary | ICD-10-CM | POA: Diagnosis not present

## 2022-03-21 LAB — URINALYSIS, ROUTINE W REFLEX MICROSCOPIC
Bilirubin Urine: NEGATIVE
Glucose, UA: NEGATIVE mg/dL
Hgb urine dipstick: NEGATIVE
Ketones, ur: NEGATIVE mg/dL
Leukocytes,Ua: NEGATIVE
Nitrite: NEGATIVE
Protein, ur: NEGATIVE mg/dL
Specific Gravity, Urine: 1.01 (ref 1.005–1.030)
pH: 5 (ref 5.0–8.0)

## 2022-03-21 LAB — TSH
TSH: 0.765 u[IU]/mL (ref 0.350–4.500)
TSH: 0.51 u[IU]/mL (ref 0.350–4.500)

## 2022-03-21 LAB — T4, FREE: Free T4: 0.95 ng/dL (ref 0.61–1.12)

## 2022-03-21 LAB — VITAMIN B12: Vitamin B-12: 504 pg/mL (ref 180–914)

## 2022-03-21 LAB — HIV ANTIBODY (ROUTINE TESTING W REFLEX): HIV Screen 4th Generation wRfx: NONREACTIVE

## 2022-03-21 LAB — CK: Total CK: 144 U/L (ref 38–234)

## 2022-03-21 LAB — FOLATE: Folate: 11 ng/mL (ref >5.9)

## 2022-03-21 MED ORDER — FENOFIBRATE 54 MG PO TABS
54.0000 mg | ORAL_TABLET | Freq: Every day | ORAL | Status: DC
  Filled 2022-03-21: qty 1

## 2022-03-21 MED ORDER — SODIUM CHLORIDE 0.9 % IV SOLN: INTRAVENOUS | Status: AC

## 2022-03-21 MED ORDER — ONDANSETRON HCL 4 MG PO TABS: 4.0000 mg | ORAL_TABLET | Freq: Four times a day (QID) | ORAL | Status: DC | PRN

## 2022-03-21 MED ORDER — ADULT MULTIVITAMIN W/MINERALS CH
1.0000 | ORAL_TABLET | Freq: Every day | ORAL | Status: DC
  Administered 2022-03-21 – 2022-03-24 (×4): 1 via ORAL
  Filled 2022-03-21 (×4): qty 1

## 2022-03-21 MED ORDER — POLYSACCHARIDE IRON COMPLEX 150 MG PO CAPS
150.0000 mg | ORAL_CAPSULE | Freq: Every day | ORAL | Status: DC
  Administered 2022-03-21 – 2022-03-24 (×4): 150 mg via ORAL
  Filled 2022-03-21 (×4): qty 1

## 2022-03-21 MED ORDER — SODIUM CHLORIDE 0.9 % IV BOLUS
500.0000 mL | Freq: Once | INTRAVENOUS | Status: AC
  Administered 2022-03-21: 500 mL via INTRAVENOUS

## 2022-03-21 MED ORDER — HEPARIN SODIUM (PORCINE) 5000 UNIT/ML IJ SOLN
5000.0000 [IU] | Freq: Three times a day (TID) | INTRAMUSCULAR | Status: DC
  Administered 2022-03-21 – 2022-03-24 (×10): 5000 [IU] via SUBCUTANEOUS
  Filled 2022-03-21 (×10): qty 1

## 2022-03-21 MED ORDER — ACETAMINOPHEN 325 MG PO TABS
650.0000 mg | ORAL_TABLET | Freq: Four times a day (QID) | ORAL | Status: DC | PRN
  Administered 2022-03-21 – 2022-03-24 (×11): 650 mg via ORAL
  Filled 2022-03-21 (×11): qty 2

## 2022-03-21 MED ORDER — LEVOTHYROXINE SODIUM 25 MCG PO TABS
12.5000 ug | ORAL_TABLET | Freq: Every day | ORAL | Status: DC
  Administered 2022-03-22 – 2022-03-24 (×3): 12.5 ug via ORAL
  Filled 2022-03-21 (×2): qty 1

## 2022-03-21 MED ORDER — LEVOTHYROXINE SODIUM 50 MCG PO TABS
50.0000 ug | ORAL_TABLET | Freq: Every day | ORAL | Status: DC
  Administered 2022-03-22 – 2022-03-24 (×3): 50 ug via ORAL
  Filled 2022-03-21 (×3): qty 1

## 2022-03-21 MED ORDER — ACETAMINOPHEN 650 MG RE SUPP: 650.0000 mg | Freq: Four times a day (QID) | RECTAL | Status: DC | PRN

## 2022-03-21 MED ORDER — MIDODRINE HCL 5 MG PO TABS
5.0000 mg | ORAL_TABLET | Freq: Three times a day (TID) | ORAL | Status: DC
  Administered 2022-03-21 – 2022-03-23 (×5): 5 mg via ORAL
  Filled 2022-03-21 (×5): qty 1

## 2022-03-21 MED ORDER — CALCIUM CARBONATE 1250 (500 CA) MG PO TABS
500.0000 mg | ORAL_TABLET | Freq: Two times a day (BID) | ORAL | Status: DC
  Administered 2022-03-21 – 2022-03-24 (×7): 1250 mg via ORAL
  Filled 2022-03-21 (×7): qty 1

## 2022-03-21 MED ORDER — ONDANSETRON HCL 4 MG/2ML IJ SOLN
4.0000 mg | Freq: Four times a day (QID) | INTRAMUSCULAR | Status: DC | PRN
  Administered 2022-03-22 – 2022-03-23 (×2): 4 mg via INTRAVENOUS
  Filled 2022-03-21 (×2): qty 2

## 2022-03-21 NOTE — Assessment & Plan Note (Signed)
Baseline creatinine 2.2-2.4 Patient follows Dr. Vanetta Mulders

## 2022-03-21 NOTE — ED Notes (Signed)
Hospitalist in room to assess patient.

## 2022-03-21 NOTE — Hospital Course (Addendum)
66 year old female with a history of cognitive delay, hypothyroidism, CKD stage IV, hyperlipidemia, iron deficiency presenting with dizziness, gait instability, and poor oral intake.  The patient is a poor historian, but is able to answer simple questions appropriately.  History is supplemented by the patient's sister with whom the patient lives.  Patient's sister relates that the patient has an altered mindset that she is overweight and often restricts her own caloric and fluid intake.  Sister says that the patient has had 2 previous prior episodes where she restricted her own oral intake resulting in weight loss and orthostasis and dizziness.  There is not been any new medications.  The patient has not had any headache, chest pain, shortness of breath, palpitations, nausea, vomiting, diarrhea, abdominal pain, dysuria. For 2 to 3 days, the patient has been complaining of lightheadedness and dizziness particularly with standing up.  She went to see her PCP.  The patient received a liter of fluids, but remained dizzy.  She was directed to the emergency department for further evaluation and treatment.  Sister states that the patient was previously on " some medicine to bring her blood pressure up" in the past, but was only on it temporarily. In the emergency department, the patient was orthostatic initially.  She received 2 L of normal saline.  The patient remained dizzy with ambulation.  Repeat orthostatics were performed on the morning of 03/21/2022, and the patient remained orthostatic.  The patient began complaining of low back pain.  MRI of the lumbar spine was performed and showed advanced facet arthropathy with some effacement of L5-S1.  There is mild disc bulge on the left without any nerve root impingement.  Overall there was mild DJD elsewhere in the lumbar spine without any other acute findings.  MRI of the brain was negative for any findings except for disproportionate cerebellar atrophy.  CT of the  abdomen and pelvis was negative for any acute findings.  BMP showed sodium 138, potassium 4.1, bicarbonate 19, serum creatinine 2.36.  WBC 5.3, hemoglobin 9.0, platelets 227,000.  EKG shows sinus rhythm without any ST-T wave changes.  UA was negative for pyuria. At baseline, the patient lives with her sister and is able to perform all of her activities of daily living without assistance up until this past week.

## 2022-03-21 NOTE — Assessment & Plan Note (Signed)
Remained orthostatic despite 2 L normal saline in the emergency department -BP went from 143/90>> 114/55 in am 9/26 -Continue IV fluids -Start midodrine -Repeat orthostatics in a.m. -Echo

## 2022-03-21 NOTE — H&P (Signed)
History and Physical    Patient: Shawna [name] ZOX:[id number] DOB: Feb 26, 1956 DOA: 03/20/2022 DOS: the patient was seen and examined on 03/21/2022 PCP: Celene Squibb, MD  Patient coming from: Home  Chief Complaint:  Chief Complaint  Patient presents with   Dizziness   HPI: Shawna [name] is a 66 year old female with a history of cognitive delay, hypothyroidism, CKD stage IV, hyperlipidemia, iron deficiency presenting with dizziness, gait instability, and poor oral intake.  The patient is a poor historian, but is able to answer simple questions appropriately.  History is supplemented by the patient's sister with whom the patient lives.  Patient's sister relates that the patient has an altered mindset that she is overweight and often restricts her own caloric and fluid intake.  Sister says that the patient has had 2 previous prior episodes where she restricted her own oral intake resulting in weight loss and orthostasis and dizziness.  There is not been any new medications.  The patient has not had any headache, chest pain, shortness of breath, palpitations, nausea, vomiting, diarrhea, abdominal pain, dysuria. For 2 to 3 days, the patient has been complaining of lightheadedness and dizziness particularly with standing up.  She went to see her PCP.  The patient received a liter of fluids, but remained dizzy.  She was directed to the emergency department for further evaluation and treatment.  Sister states that the patient was previously on " some medicine to bring her blood pressure up" in the past, but was only on it temporarily. In the emergency department, the patient was orthostatic initially.  She received 2 L of normal saline.  The patient remained dizzy with ambulation.  Repeat orthostatics were performed on the morning of 03/21/2022, and the patient remained orthostatic.  The patient began complaining of low back pain.  MRI of the lumbar spine was performed and showed advanced facet arthropathy with some  effacement of L5-S1.  There is mild disc bulge on the left without any nerve root impingement.  Overall there was mild DJD elsewhere in the lumbar spine without any other acute findings.  MRI of the brain was negative for any findings except for disproportionate cerebellar atrophy.  CT of the abdomen and pelvis was negative for any acute findings.  BMP showed sodium 138, potassium 4.1, bicarbonate 19, serum creatinine 2.36.  WBC 5.3, hemoglobin 9.0, platelets 227,000.  EKG shows sinus rhythm without any ST-T wave changes.  UA was negative for pyuria. At baseline, the patient lives with her sister and is able to perform all of her activities of daily living without assistance up until this past week.  Review of Systems: As mentioned in the history of present illness. All other systems reviewed and are negative. Past Medical History:  Diagnosis Date   Arthritis    Chronic kidney disease    Chronic pain    Hyperlipidemia    Iron deficiency anemia    Osteoporosis    s/p forteo   Thyroid disease    Past Surgical History:  Procedure Laterality Date   CHOLECYSTECTOMY     JOINT REPLACEMENT     right hip x 3 surgeries   Social History:  reports that she has never smoked. She has never used smokeless tobacco. She reports that she does not drink alcohol and does not use drugs.  Allergies  Allergen Reactions   Bisphosphonates     Irritation of abdominal lining and oropharynx    History reviewed. No pertinent family history.  Prior to Admission medications  Medication Sig Start Date End Date Taking? Authorizing Provider  calcium gluconate 500 MG tablet Take 500 mg by mouth 2 (two) times daily.     Yes [provider]  fenofibrate (TRICOR) 48 MG tablet TAKE ONE (1) TABLET BY MOUTH EVERY DAY FOR CHOLESTEROL 03/31/20  Yes [place], Modena Nunnery, MD  iron polysaccharides (NU-IRON) 150 MG capsule Take 1 capsule (150 mg total) by mouth daily. 10/06/16  Yes , Modena Nunnery, MD  levothyroxine  (SYNTHROID) 25 MCG tablet TAKE ONE-HALF TABLET BY MOUTH ALONG WITH50MCG DAILY TO GIVE A TOTAL OF 62.5MCG 11/25/20  Yes Susy Frizzle, MD  levothyroxine (SYNTHROID) 50 MCG tablet TAKE ONE TABLET BY MOUTH ALONG WITH ONE-HALF TO THE 25MCG TO EQUAL 62.5MCG EVERY MORNING 11/25/20  Yes Susy Frizzle, MD  Multiple Vitamins-Minerals (CENTRUM SILVER) CHEW Chew 1 tablet by mouth daily.     Yes [provider]  oxyCODONE (OXY IR/ROXICODONE) 5 MG immediate release tablet Take 0.5 tablets (2.5 mg total) by mouth 2 (two) times daily as needed. Patient not taking: Reported on 03/21/2022 03/29/20   Alycia Rossetti, MD    Physical Exam: Vitals:   03/21/22 1132 03/21/22 1238 03/21/22 1330 03/21/22 1430  BP: 135/74 135/75 134/86   Pulse: 84 75 87   Resp: 18 17 18    Temp:    98.2 F (36.8 C)  TempSrc:    Oral  SpO2: 99% 93% 95%   Weight:      Height:       GENERAL:  A&O x 3, NAD, well developed, cooperative, follows commands HEENT: [place]/AT, No thrush, No icterus, No oral ulcers Neck:  No neck mass, No meningismus, soft, supple CV: RRR, no S3, no S4, no rub, no JVD Lungs:  CTA, no wheeze, no rhonchi, good air movement Abd: soft/NT +BS, nondistended Ext: No edema, no lymphangitis, no cyanosis, no rashes Neuro:  CN II-XII intact, strength 4/5 in RUE, RLE, strength 4-/5 LUE, LLE; sensation intact bilateral; no dysmetria; babinski equivocal  Data Reviewed: Results noted above in the history  Assessment and Plan: * Orthostatic hypotension Remained orthostatic despite 2 L normal saline in the emergency department -BP went from 143/90>> 114/55 in am 9/26 -Continue IV fluids -Start midodrine -Repeat orthostatics in a.m. -Echo  Metabolic acidosis Secondary to CKD May need to start bicarbonate  Failure to thrive in adult UA negative for pyuria TSH Q22 Folic acid PT evaluation  Chronic kidney disease (CKD), stage IV (severe) (HCC) Baseline creatinine 2.2-2.4 Patient follows Dr. Vanetta Mulders  Hyperlipidemia Continue Tricor  Chronic pain Patient has chronic right hip pain for which she intermittently takes NSAIDs -Avoid NSAIDs given CKD -MRI lumbar spine negative for cauda equina or infectious process or cord compression  Hypothyroid Continue Synthroid      Advance Care Planning: FULL  Consults: none  Family Communication: sister updated 9/26  Severity of Illness: The appropriate patient status for this patient is INPATIENT. Inpatient status is judged to be reasonable and necessary in order to provide the required intensity of service to ensure the patient's safety. The patient's presenting symptoms, physical exam findings, and initial radiographic and laboratory data in the context of their chronic comorbidities is felt to place them at high risk for further clinical deterioration. Furthermore, it is not anticipated that the patient will be medically stable for discharge from the hospital within 2 midnights of admission.   * I certify that at the point of admission it is my clinical judgment that the patient  will require inpatient hospital care spanning beyond 2 midnights from the point of admission due to high intensity of service, high risk for further deterioration and high frequency of surveillance required.*  Author: Orson Eva, MD 03/21/2022 2:40 PM  For on call review [email or web address].

## 2022-03-21 NOTE — Progress Notes (Signed)
Notified provider of cramping in right leg.

## 2022-03-21 NOTE — ED Notes (Signed)
Pt states she cannot void, bladder distended. RN bladder scanned pt, showed 999+. Dr. Sedonia Small made aware.

## 2022-03-21 NOTE — Assessment & Plan Note (Signed)
Patient has chronic right hip pain for which she intermittently takes NSAIDs -Avoid NSAIDs given CKD -MRI lumbar spine negative for cauda equina or infectious process or cord compression

## 2022-03-21 NOTE — Assessment & Plan Note (Signed)
Secondary to CKD May need to start bicarbonate

## 2022-03-21 NOTE — Assessment & Plan Note (Signed)
Continue Synthroid °

## 2022-03-21 NOTE — Discharge Instructions (Addendum)
Make sure you are drinking plenty of fluids to avoid dehydration. Plan to see your primary MD or return here if your symptoms return or worsen.  Keep the Foley catheter in place.  Follow-up with urology.  Determine if it can be removed he had significant urinary retention here.  The MRI work-up without any acute findings.  Also make an appointment follow-up with your doctor.

## 2022-03-21 NOTE — ED Provider Notes (Addendum)
  Provider Note MRN:  [id number]  Arrival date & time: 03/21/22    ED Course and Medical Decision Making  Assumed care from PA Norwood Idol at shift change.  Patient has been restricting calories and fluids recently and is having some lightheadedness especially with position change, orthostatic here in the emergency department.  Otherwise very reassuring exam, providing fluids and will reassess, anticipating discharge.  After fluids patient was reassessed, looked well, while maybe she was ready for discharge.  We attempted to ambulate her and she became very dizzy, had to sit back down.  Still too symptomatic for discharge.  Then it became clear that patient was retaining urine, unable to urinate.  Large distended bladder with greater than 900 cc on bladder scan.  History of cognitive impairment, was not really complaining of any discomfort.  Urinalysis without evidence of infection.  On reassessment patient does complain of back pain, right hip pain, movement of her right leg is limited, seems to be due to pain.  Concern for cauda equina or myelopathy is present, CT imaging does not show any obvious intra-abdominal reason for the urinary retention, of the lumbar spine is showing some abnormalities and so we will obtain an MRI to exclude myelopathy, cauda equina, epidural abscess etc.  Signed out to oncoming provider at shift change.  Procedures  Final Clinical Impressions(s) / ED Diagnoses     ICD-10-CM   1. Orthostatic hypotension  I95.1     2. Urinary retention  R33.9       ED Discharge Orders     None        Barth Kirks. [place] Small, [place] [email or web address]    Maudie Flakes, MD 03/21/22 [id number]    Maudie Flakes, MD 03/21/22 [phone]

## 2022-03-21 NOTE — ED Provider Notes (Addendum)
MRI head and MRI lumbar spine area without any acute findings or anything suggestive of spinal cord infection or abscess or central disc herniation.  MRI brain without any acute findings.  Patient should be stable for discharge home.   Fredia Sorrow, MD 03/21/22 1209   Patient has the urinary retention so has Foley catheter in place did receive a lot of IV fluids.  Patient still having significant orthostasis.  And and very weak and unsteady on her feet.  Feel that patient probably needs admission may need movement to rehab.   Fredia Sorrow, MD 03/21/22 1311

## 2022-03-21 NOTE — Assessment & Plan Note (Signed)
Continue Tricor 

## 2022-03-21 NOTE — Assessment & Plan Note (Signed)
UA negative for pyuria TSH P70 Folic acid PT evaluation

## 2022-03-22 ENCOUNTER — Inpatient Hospital Stay (HOSPITAL_COMMUNITY): Payer: Medicare HMO

## 2022-03-22 DIAGNOSIS — I951 Orthostatic hypotension: Secondary | ICD-10-CM

## 2022-03-22 LAB — ECHOCARDIOGRAM COMPLETE
Area-P 1/2: 2.99 cm2
Height: 64 in
S' Lateral: 2.5 cm
Weight: 1654.33 oz

## 2022-03-22 LAB — BASIC METABOLIC PANEL
Anion gap: 6 (ref 5–15)
BUN: 25 mg/dL — ABNORMAL HIGH (ref 8–23)
CO2: 18 mmol/L — ABNORMAL LOW (ref 22–32)
Calcium: 6.6 mg/dL — ABNORMAL LOW (ref 8.9–10.3)
Chloride: 115 mmol/L — ABNORMAL HIGH (ref 98–111)
Creatinine, Ser: 1.36 mg/dL — ABNORMAL HIGH (ref 0.44–1.00)
GFR, Estimated: 43 mL/min — ABNORMAL LOW (ref >60)
Glucose, Bld: 105 mg/dL — ABNORMAL HIGH (ref 70–99)
Potassium: 3.4 mmol/L — ABNORMAL LOW (ref 3.5–5.1)
Sodium: 139 mmol/L (ref 135–145)

## 2022-03-22 LAB — CBC
HCT: 23.1 % — ABNORMAL LOW (ref 36.0–46.0)
Hemoglobin: 7.6 g/dL — ABNORMAL LOW (ref 12.0–15.0)
MCH: 32.5 pg (ref 26.0–34.0)
MCHC: 32.9 g/dL (ref 30.0–36.0)
MCV: 98.7 fL (ref 80.0–100.0)
Platelets: 152 10*3/uL (ref 150–400)
RBC: 2.34 MIL/uL — ABNORMAL LOW (ref 3.87–5.11)
RDW: 12.9 % (ref 11.5–15.5)
WBC: 6.7 10*3/uL (ref 4.0–10.5)
nRBC: 0 % (ref 0.0–0.2)

## 2022-03-22 LAB — MAGNESIUM: Magnesium: 0.8 mg/dL — CL (ref 1.7–2.4)

## 2022-03-22 MED ORDER — MAGNESIUM SULFATE 4 GM/100ML IV SOLN
4.0000 g | Freq: Once | INTRAVENOUS | Status: AC
  Administered 2022-03-22: 4 g via INTRAVENOUS
  Filled 2022-03-22: qty 100

## 2022-03-22 NOTE — Progress Notes (Signed)
*  PRELIMINARY RESULTS* Echocardiogram 2D Echocardiogram has been performed.  Shawna [name] 03/22/2022, 3:27 PM

## 2022-03-22 NOTE — Evaluation (Signed)
Physical Therapy Evaluation Patient Details Name: Shawna [name] MRN: [id number] DOB: [birth year] Today's Date: 03/22/2022  History of Present Illness  Shawna [name] is a 66 year old female with a history of cognitive delay, hypothyroidism, CKD stage IV, hyperlipidemia, iron deficiency presenting with dizziness, gait instability, and poor oral intake.  Admitted 03/20/22 with orthostatic hypotension; failure to thrive; and Metabolic acidosis.  Clinical Impression  Pt admitted with above diagnosis. Limited mobility assessment due to symptomatic orthostatic hypotension (see comments below). Anticipate patient will return to baseline independence with mobility once orthostatics controlled. Currently recommend OPPT at d/c however if she fails to progress we may need to make other arrangements. (Will continue to monitor safety and progression with mobility during admission. Lives with her sister. Pt currently with functional limitations due to the deficits listed below (see PT Problem List). Pt will benefit from skilled PT to increase their independence and safety with mobility to allow discharge to the venue listed below.          Recommendations for follow up therapy are one component of a multi-disciplinary discharge planning process, led by the attending physician.  Recommendations may be updated based on patient status, additional functional criteria and insurance authorization.  Follow Up Recommendations Outpatient PT (Anticipated - if fails to progress, consider HHPT with assist available from sister)      Assistance Recommended at Discharge Intermittent Supervision/Assistance  Patient can return home with the following  A little help with walking and/or transfers;A little help with bathing/dressing/bathroom;Assistance with cooking/housework;Assist for transportation;Help with stairs or ramp for entrance    Equipment Recommendations  (TBD - pending progress; may require RW)  Recommendations for Other  Services       Functional Status Assessment Patient has had a recent decline in their functional status and demonstrates the ability to make significant improvements in function in a reasonable and predictable amount of time.     Precautions / Restrictions Precautions Precautions: Fall Precaution Comments: Monitor BP (orthostatic) Restrictions Weight Bearing Restrictions: No      Mobility  Bed Mobility Overal bed mobility: Needs Assistance Bed Mobility: Supine to Sit, Sit to Supine     Supine to sit: Supervision Sit to supine: Min assist   General bed mobility comments: SLow and effortful movement, cues for technique, able to rise to EOB without assist but needs some LE support back into bed today.    Transfers Overall transfer level: Needs assistance Equipment used: Rolling walker (2 wheels) Transfers: Sit to/from Stand Sit to Stand: Min guard           General transfer comment: Min guard for safety, slow to rise, reports dizziness but able to tolerate standing long enough for BP to be checked (see below.)    Ambulation/Gait               General Gait Details: Omitted due to symptomatic orthostatics  Stairs            Wheelchair Mobility    Modified Rankin (Stroke Patients Only)       Balance Overall balance assessment: Mild deficits observed, not formally tested                                           Pertinent Vitals/Pain Pain Assessment Pain Assessment: 0-10 Pain Score: 10-Worst pain ever Pain Location: Right hip (mild on Lt hip too) (reports chronic) Pain Descriptors / Indicators:  Aching Pain Intervention(s): Monitored during session, Limited activity within patient's tolerance, Premedicated before session, Repositioned    Home Living Family/patient expects to be discharged to:: Private residence Living Arrangements: Other relatives Available Help at Discharge: Family;Available PRN/intermittently Type of Home:  House Home Access: Stairs to enter Entrance Stairs-Rails: Right Entrance Stairs-Number of Steps: 3 Alternate Level Stairs-Number of Steps: 13 Home Layout: Two level;Bed/bath upstairs Home Equipment: Cane - single point      Prior Function Prior Level of Function : Independent/Modified Independent             Mobility Comments: Reports independent at baseline, no falls, no AD use. ADLs Comments: IND     Hand Dominance   Dominant Hand: Right    Extremity/Trunk Assessment   Upper Extremity Assessment Upper Extremity Assessment: Defer to OT evaluation    Lower Extremity Assessment Lower Extremity Assessment: Generalized weakness       Communication   Communication: No difficulties  Cognition Arousal/Alertness: Awake/alert Behavior During Therapy: WFL for tasks assessed/performed Overall Cognitive Status: History of cognitive impairments - at baseline                                 General Comments: Oriented except states she is at [place] of month, year, self and situation.        General Comments General comments (skin integrity, edema, etc.): Orthostatics: Supine 128/95 HR 81; seated 115/57 HR 71; Standing 109/71 HR 91 (symptomatic with each change in position.) SpO2 100% After returning to bed for several minutes BP 135/80 HR 77.    Exercises General Exercises - Lower Extremity Ankle Circles/Pumps: AROM, Both, 10 reps, Supine Quad Sets: Strengthening, Both, 10 reps, Supine Gluteal Sets: Strengthening, Both, 10 reps, Supine Heel Slides: AROM, Both, 5 reps, Supine Hip ABduction/ADduction: AROM, Both, 5 reps, Supine   Assessment/Plan    PT Assessment Patient needs continued PT services  PT Problem List Decreased strength;Decreased range of motion;Decreased activity tolerance;Decreased balance;Decreased mobility;Decreased knowledge of use of DME;Decreased cognition;Cardiopulmonary status limiting activity;Pain       PT  Treatment Interventions DME instruction;Gait training;Stair training;Functional mobility training;Therapeutic activities;Therapeutic exercise;Balance training;Neuromuscular re-education;Patient/family education    PT Goals (Current goals can be found in the Care Plan section)  Acute Rehab PT Goals Patient Stated Goal: Feel better, get stronger PT Goal Formulation: With patient Time For Goal Achievement: 04/05/22 Potential to Achieve Goals: Good    Frequency Min 3X/week     Co-evaluation               AM-PAC PT "6 Clicks" Mobility  Outcome Measure Help needed turning from your back to your side while in a flat bed without using bedrails?: None Help needed moving from lying on your back to sitting on the side of a flat bed without using bedrails?: A Little Help needed moving to and from a bed to a chair (including a wheelchair)?: A Little Help needed standing up from a chair using your arms (e.g., wheelchair or bedside chair)?: A Little Help needed to walk in hospital room?: A Little Help needed climbing 3-5 steps with a railing? : A Lot 6 Click Score: 18    End of Session   Activity Tolerance: Treatment limited secondary to medical complications (Comment) (Orthostatic hypotension) Patient left: in bed;with call bell/phone within reach;with bed alarm set   PT Visit Diagnosis: Unsteadiness on feet (R26.81);Muscle weakness (generalized) (M62.81);Difficulty in walking, not elsewhere  classified (R26.2);Dizziness and giddiness (R42);Adult, failure to thrive (R62.7);Pain Pain - Right/Left: Right Pain - part of body: Hip    Time: 1003-1031 PT Time Calculation (min) (ACUTE ONLY): 28 min   Charges:   PT Evaluation $PT Eval Low Complexity: 1 Low PT Treatments $Therapeutic Activity: 8-22 mins        Candie Mile, PT, DPT Physical Therapist Acute Rehabilitation Services [place] 03/22/2022, 10:36 AM

## 2022-03-22 NOTE — Progress Notes (Signed)
PROGRESS NOTE    Shawna [name]  MEQ:[id number] DOB: Jan 22, 1956 DOA: 03/20/2022 PCP: Celene Squibb, MD   Brief Narrative:    Shawna [name] is a 66 year old female with a history of cognitive delay, hypothyroidism, CKD stage IV, hyperlipidemia, iron deficiency presenting with dizziness, gait instability, and poor oral intake.  She appears to restrict her on caloric and fluid intake and was noted to be orthostatic.  She also had weakness and low back pain for which she underwent MRI evaluation which was nonspecific.  She has been started on IV fluid and midodrine and continues to remain symptomatic.  She is hypomagnesemic which is being replaced and 2D echocardiogram is pending.  PT recommending outpatient physical therapy.  Assessment & Plan:   Principal Problem:   Orthostatic hypotension Active Problems:   Hypothyroid   Chronic pain   Hyperlipidemia   Chronic kidney disease (CKD), stage IV (severe) (HCC)   Failure to thrive in adult   Metabolic acidosis  Assessment and Plan:   Orthostatic hypotension Remained orthostatic despite 2 L normal saline in the emergency department -BP went from 143/90>> 114/55 in am 9/26 -Continue IV fluids -Start midodrine -Repeat orthostatics in a.m. -Echo ordered and pending -PT recommending OPPT   Metabolic acidosis Secondary to CKD Start bicarbonate  Severe hypomagnesemia Replete and reevaluate in a.m.   Failure to thrive in adult UA negative for pyuria TSH WNL W97 WNL Folic acid WNL PT evaluation with outpt PT   Chronic kidney disease (CKD), stage IV (severe) (HCC) Baseline creatinine 2.2-2.4 Patient follows Dr. Vanetta Mulders   Hyperlipidemia Continue Tricor   Chronic pain Patient has chronic right hip pain for which she intermittently takes NSAIDs -Avoid NSAIDs given CKD -MRI lumbar spine negative for cauda equina or infectious process or cord compression   Hypothyroid Continue Synthroid     DVT prophylaxis:Heparin Code  Status: Full Family Communication: None at bedside Disposition Plan:  Status is: Inpatient Remains inpatient appropriate because: Need for IVF   Consultants:  None  Procedures:  None  Antimicrobials:  None   Subjective: Patient seen and evaluated today and is complaining of being nauseous and has poor oral intake.  She has ongoing gait instability and dizziness upon standing.  Objective: Vitals:   03/21/22 1430 03/21/22 1604 03/21/22 1921 03/22/22 1137  BP:  (!) 150/75 (!) 147/83 118/72  Pulse:  94 87 76  Resp:  18 19 (!) 22  Temp: 98.2 F (36.8 C) 98.5 F (36.9 C) 98.6 F (37 C) 98 F (36.7 C)  TempSrc: Oral  Oral Oral  SpO2:  92% 99% 97%  Weight:  46.9 kg    Height:  5\' 4"  (1.626 m)      Intake/Output Summary (Last 24 hours) at 03/22/2022 1230 Last data filed at 03/22/2022 9892 Gross per 24 hour  Intake 981.39 ml  Output 1800 ml  Net -818.61 ml   Filed Weights   03/20/22 1553 03/21/22 1604  Weight: 48.1 kg 46.9 kg    Examination:  General exam: Appears calm and comfortable  Respiratory system: Clear to auscultation. Respiratory effort normal. Cardiovascular system: S1 & S2 heard, RRR.  Gastrointestinal system: Abdomen is soft Central nervous system: Alert and awake Extremities: No edema Skin: No significant lesions noted Psychiatry: Flat affect.    Data Reviewed: I have personally reviewed following labs and imaging studies  CBC: Recent Labs  Lab 03/20/22 1645 03/22/22 0755  WBC 5.3 6.7  HGB 9.0* 7.6*  HCT 27.0* 23.1*  MCV 97.1 98.7  PLT 227 505   Basic Metabolic Panel: Recent Labs  Lab 03/20/22 1645 03/22/22 0755  NA 138 139  K 4.1 3.4*  CL 110 115*  CO2 19* 18*  GLUCOSE 111* 105*  BUN 53* 25*  CREATININE 2.36* 1.36*  CALCIUM 7.8* 6.6*  MG  --  0.8*   GFR: Estimated Creatinine Clearance: 30.5 mL/min (A) (by C-G formula based on SCr of 1.36 mg/dL (H)). Liver Function Tests: No results for input(s): "AST", "ALT", "ALKPHOS",  "BILITOT", "PROT", "ALBUMIN" in the last 168 hours. No results for input(s): "LIPASE", "AMYLASE" in the last 168 hours. No results for input(s): "AMMONIA" in the last 168 hours. Coagulation Profile: No results for input(s): "INR", "PROTIME" in the last 168 hours. Cardiac Enzymes: Recent Labs  Lab 03/21/22 1634  CKTOTAL 144   BNP (last 3 results) No results for input(s): "PROBNP" in the last 8760 hours. HbA1C: No results for input(s): "HGBA1C" in the last 72 hours. CBG: No results for input(s): "GLUCAP" in the last 168 hours. Lipid Profile: No results for input(s): "CHOL", "HDL", "LDLCALC", "TRIG", "CHOLHDL", "LDLDIRECT" in the last 72 hours. Thyroid Function Tests: Recent Labs    03/21/22 1634  TSH 0.510  FREET4 0.95   Anemia Panel: Recent Labs    03/21/22 1634  VITAMINB12 504  FOLATE 11.0   Sepsis Labs: No results for input(s): "PROCALCITON", "LATICACIDVEN" in the last 168 hours.  No results found for this or any previous visit (from the past 240 hour(s)).       Radiology Studies: MR BRAIN WO CONTRAST  Result Date: 03/21/2022 CLINICAL DATA:  Neuro deficit, stroke suspected EXAM: MRI HEAD WITHOUT CONTRAST TECHNIQUE: Multiplanar, multiecho pulse sequences of the brain and surrounding structures were obtained without intravenous contrast. COMPARISON:  CT head 08/19/2010, brain MRI 08/27/2007 FINDINGS: Brain: There is no acute intracranial hemorrhage, extra-axial fluid collection, or acute infarct. Supratentorial parenchymal volume is normal. There is disproportionate cerebellar atrophy. Disproportionate cerebellar atrophy was also present on the study from 2009 and has not significantly changed in the interim. The ventricles are normal in size. Gray-white differentiation is preserved. There is patchy FLAIR signal abnormality in the supratentorial white matter which is nonspecific but likely reflects sequela of chronic small vessel ischemic change There is no mass lesion.   There is no mass effect or midline shift. Vascular: Normal flow voids. Skull and upper cervical spine: Normal marrow signal. Sinuses/Orbits: There is mild mucosal thickening in the paranasal sinuses. The globes and orbits are unremarkable. Other: None. IMPRESSION: 1. No acute intracranial pathology. 2. Disproportionate cerebellar atrophy is not significantly changed since 2009. Electronically Signed   By: Valetta Mole M.D.   On: 03/21/2022 11:31   MR LUMBAR SPINE WO CONTRAST  Result Date: 03/21/2022 CLINICAL DATA:  Low back pain EXAM: MRI LUMBAR SPINE WITHOUT CONTRAST TECHNIQUE: Multiplanar, multisequence MR imaging of the lumbar spine was performed. No intravenous contrast was administered. COMPARISON:  Same-day CT lumbar spine FINDINGS: Segmentation: Standard; the lowest formed disc space is designated L5-S1. Alignment:  Normal. Vertebrae: Vertebral body heights are preserved. Marrow signal is somewhat heterogeneous but within normal limits. There is no suspicious marrow signal abnormality or marrow edema. Conus medullaris and cauda equina: Conus extends to the L1-L2 level. Conus and cauda equina appear normal. Paraspinal and other soft tissues: Unremarkable. Disc levels: There is overall mild multilevel disc degeneration throughout the lumbar spine T12-L1: Imaged in the sagittal projection only. There is a small central disc protrusion without significant spinal canal or neural foraminal  stenosis L1-L2: There is a mild disc bulge eccentric to the right and mild facet arthropathy resulting in mild right and no significant left neural foraminal stenosis and no significant spinal canal stenosis L2-L3: There is a mild disc bulge eccentric to the right and mild right worse than left facet arthropathy without significant spinal canal or neural foraminal stenosis L3-L4: There is a mild disc bulge and mild bilateral facet arthropathy without significant spinal canal or neural foraminal stenosis L4-L5: There is a mild  disc bulge eccentric to the left and mild facet arthropathy resulting in mild left and no significant right neural foraminal stenosis and no significant spinal canal stenosis. L5-S1: There is advanced left facet arthropathy with a small effusion and a minimal disc bulge eccentric to the left resulting in mild left and no significant right neural foraminal stenosis and no significant spinal canal stenosis. Mild left subarticular zone narrowing without evidence of nerve root impingement. IMPRESSION: 1. Advanced facet arthropathy with a small effusion at L5-S1. Minimal disc bulge eccentric to the left at this level with mild left subarticular zone narrowing but no evidence of nerve root impingement. 2. Overall mild degenerative changes elsewhere in the lumbar spine as above without evidence of nerve root impingement. 3. No acute finding. Electronically Signed   By: Valetta Mole M.D.   On: 03/21/2022 11:27   CT L-SPINE NO CHARGE  Result Date: 03/21/2022 CLINICAL DATA:  66 year old female with unexplained urinary retention. EXAM: CT LUMBAR SPINE WITHOUT CONTRAST TECHNIQUE: Technique: Multiplanar CT images of the lumbar spine were reconstructed from contemporary CT of the Abdomen and Pelvis. RADIATION DOSE REDUCTION: This exam was performed according to the departmental dose-optimization program which includes automated exposure control, adjustment of the mA and/or kV according to patient size and/or use of iterative reconstruction technique. CONTRAST:  None COMPARISON:  CT Abdomen and Pelvis today are reported separately. And CT Abdomen and Pelvis 01/24/2018. FINDINGS: Segmentation: Normal. Alignment: Lumbar lordosis appears stable since 2019. No spondylolisthesis. No scoliosis. 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 Osteopenia. No acute osseous abnormality identified. Intact visible sacrum and SI joints. Paraspinal and other soft tissues: Abdomen and pelvis are  detailed separately. The lumbar paraspinal soft tissues are within normal limits. Disc levels: T12-L1:  Negative. L1-L2: Mild circumferential disc bulging eccentric to the right. No spinal stenosis. Mild right L1 foraminal stenosis. L2-L3: Mild circumferential disc bulging. Mild facet hypertrophy. No spinal stenosis. Borderline to mild L2 foraminal stenosis. L3-L4: Mild disc bulging. Mild posterior element hypertrophy. No stenosis. L4-L5: Mild disc bulging. Mild to moderate facet and ligament flavum hypertrophy. No significant spinal stenosis. Borderline to mild L4 foraminal stenosis. L5-S1: Circumferential disc bulge with a broad-based posterior component eccentric to the left on series 7, image 88. Moderate facet hypertrophy greater on the left. No significant spinal stenosis. But mild or moderate left lateral recess stenosis (left S1 nerve level). Only mild left L5 foraminal stenosis. IMPRESSION: 1. No acute osseous abnormality and no CT evidence of lumbar spinal stenosis. 2. Leftward disc degeneration at L5-S1 and left greater than right facet hypertrophy. Mild to moderate left lateral recess stenosis and mild left foraminal stenosis at that level. Query left S1 and/or L5 radiculitis. 3. CT Abdomen and Pelvis today reported separately. Electronically Signed   By: Genevie Ann M.D.   On: 03/21/2022 05:27   CT ABDOMEN PELVIS WO CONTRAST  Result Date: 03/21/2022 CLINICAL DATA:  66 year old female with unexplained urinary retention. EXAM: CT ABDOMEN AND PELVIS WITHOUT  CONTRAST TECHNIQUE: Multidetector CT imaging of the abdomen and pelvis was performed following the standard protocol without IV contrast. RADIATION DOSE REDUCTION: This exam was performed according to the departmental dose-optimization program which includes automated exposure control, adjustment of the mA and/or kV according to patient size and/or use of iterative reconstruction technique. COMPARISON:  Lumbar spine CT today reported separately. CT  Abdomen and Pelvis 01/24/2018. FINDINGS: Lower chest: Chronic pectus excavatum. Increased cardiac size since 2019, mild-to-moderate cardiomegaly now. No pericardial effusion. Possible trace pleural fluid at both lung bases. Otherwise mild lung base atelectasis. Hepatobiliary: Chronically absent gallbladder. Negative noncontrast liver. Pancreas: Negative. Spleen: Negative; occasional small chronic calcified splenic granulomas. Adrenals/Urinary Tract: Normal adrenal glands. Noncontrast kidneys appear symmetric and nonobstructed. No nephrolithiasis or pararenal inflammation. Both ureters also appear unremarkable. Streak artifact in the pelvis from right hip arthroplasty. The bladder appears decompressed by a Foley catheter. Stomach/Bowel: Retained gas and stool in the rectum. Smaller caliber upstream sigmoid colon is redundant with mild retained gas and stool. Similar retained stool in the transverse and descending colon. Similar retained stool in redundant right colon. No large bowel inflammation identified. Cecum partially located in the pelvis with streak artifact there from hip arthroplasty. Appendix not delineated. No dilated small bowel. No free air or free fluid. Stomach and duodenum appear unremarkable. Vascular/Lymphatic: Aortoiliac calcified atherosclerosis. Normal caliber abdominal aorta. No lymphadenopathy identified. Reproductive: Limited pelvic visualization due to streak artifact from right hip arthroplasty. Uterus appears to be diminutive or absent. Ovaries not identified. Other: No pelvic free fluid. Musculoskeletal: Pectus excavatum. Lumbar spine is detailed separately. Chronic right hip arthroplasty. No acute or suspicious osseous lesion identified. IMPRESSION: 1. No acute or inflammatory process identified on this noncontrast exam. Limited visualization of the pelvis due to chronic right hip arthroplasty streak artifact. The bladder appears to be decompressed by Foley catheter. No evidence of  urinary calculus or obstructive uropathy. 2. Lumbar Spine CT is reported separately. 3. Chronic pectus excavatum with new cardiomegaly since 2019. Possible trace pleural the fusions, but otherwise mild lung base atelectasis. 4. Aortic Atherosclerosis (ICD10-I70.0). Electronically Signed   By: Genevie Ann M.D.   On: 03/21/2022 05:23        Scheduled Meds:  calcium carbonate  500 mg of elemental calcium Oral BID WC   heparin  5,000 Units Subcutaneous Q8H   iron polysaccharides  150 mg Oral Daily   levothyroxine  12.5 mcg Oral Q0600   levothyroxine  50 mcg Oral Q0600   midodrine  5 mg Oral TID WC   multivitamin with minerals  1 tablet Oral Daily   Continuous Infusions:  sodium chloride 75 mL/hr at 03/22/22 0607     LOS: 1 day    Time spent: 35 minutes    Shawna [name] Darleen Crocker, DO [facility]  If 7PM-7AM, please contact night-coverage [email or web address] 03/22/2022, 12:30 PM

## 2022-03-22 NOTE — TOC Progression Note (Signed)
Transition of Care [facility]) - Progression Note    Patient Details  Name: Tristy Udovich MRN: [id number] Date of Birth: 10/07/1955  Transition of [facility] CM/SW Contact  Boneta Lucks, RN Phone Number: 03/22/2022, 4:33 PM  Clinical Narrative:   TOC spoke with patient sister. Patient is weak and dizzy today. Sister states she can not walk and can not be left home alone. She is asking for PT reevaluate. Her first choice is [facility].       Barriers to Discharge: Continued Medical Work up  Expected Discharge Plan and Services

## 2022-03-23 DIAGNOSIS — I951 Orthostatic hypotension: Secondary | ICD-10-CM | POA: Diagnosis not present

## 2022-03-23 LAB — BASIC METABOLIC PANEL
Anion gap: 5 (ref 5–15)
BUN: 22 mg/dL (ref 8–23)
CO2: 17 mmol/L — ABNORMAL LOW (ref 22–32)
Calcium: 7 mg/dL — ABNORMAL LOW (ref 8.9–10.3)
Chloride: 118 mmol/L — ABNORMAL HIGH (ref 98–111)
Creatinine, Ser: 1.31 mg/dL — ABNORMAL HIGH (ref 0.44–1.00)
GFR, Estimated: 45 mL/min — ABNORMAL LOW (ref >60)
Glucose, Bld: 69 mg/dL — ABNORMAL LOW (ref 70–99)
Potassium: 3.6 mmol/L (ref 3.5–5.1)
Sodium: 140 mmol/L (ref 135–145)

## 2022-03-23 LAB — CBC
HCT: 23.7 % — ABNORMAL LOW (ref 36.0–46.0)
Hemoglobin: 7.7 g/dL — ABNORMAL LOW (ref 12.0–15.0)
MCH: 32.8 pg (ref 26.0–34.0)
MCHC: 32.5 g/dL (ref 30.0–36.0)
MCV: 100.9 fL — ABNORMAL HIGH (ref 80.0–100.0)
Platelets: 159 10*3/uL (ref 150–400)
RBC: 2.35 MIL/uL — ABNORMAL LOW (ref 3.87–5.11)
RDW: 13.2 % (ref 11.5–15.5)
WBC: 6.9 10*3/uL (ref 4.0–10.5)
nRBC: 0 % (ref 0.0–0.2)

## 2022-03-23 LAB — RETICULOCYTES
Immature Retic Fract: 16.2 % — ABNORMAL HIGH (ref 2.3–15.9)
RBC.: 2.35 MIL/uL — ABNORMAL LOW (ref 3.87–5.11)
Retic Count, Absolute: 42.8 10*3/uL (ref 19.0–186.0)
Retic Ct Pct: 1.8 % (ref 0.4–3.1)

## 2022-03-23 LAB — IRON AND TIBC
Iron: 12 ug/dL — ABNORMAL LOW (ref 28–170)
Saturation Ratios: 4 % — ABNORMAL LOW (ref 10.4–31.8)
TIBC: 293 ug/dL (ref 250–450)
UIBC: 281 ug/dL

## 2022-03-23 LAB — VITAMIN B12: Vitamin B-12: 822 pg/mL (ref 180–914)

## 2022-03-23 LAB — MAGNESIUM: Magnesium: 1.9 mg/dL (ref 1.7–2.4)

## 2022-03-23 LAB — FERRITIN: Ferritin: 391 ng/mL — ABNORMAL HIGH (ref 11–307)

## 2022-03-23 LAB — FOLATE: Folate: 16.2 ng/mL (ref >5.9)

## 2022-03-23 LAB — GLUCOSE, CAPILLARY: Glucose-Capillary: 108 mg/dL — ABNORMAL HIGH (ref 70–99)

## 2022-03-23 MED ORDER — MIDODRINE HCL 5 MG PO TABS
10.0000 mg | ORAL_TABLET | Freq: Three times a day (TID) | ORAL | Status: DC
  Administered 2022-03-23 – 2022-03-24 (×4): 10 mg via ORAL
  Filled 2022-03-23 (×5): qty 2

## 2022-03-23 MED ORDER — CHLORHEXIDINE GLUCONATE CLOTH 2 % EX PADS
6.0000 | MEDICATED_PAD | Freq: Every day | CUTANEOUS | Status: DC
  Administered 2022-03-23 – 2022-03-24 (×2): 6 via TOPICAL

## 2022-03-23 MED ORDER — POLYETHYLENE GLYCOL 3350 17 G PO PACK
17.0000 g | PACK | Freq: Once | ORAL | Status: AC
  Administered 2022-03-23: 17 g via ORAL
  Filled 2022-03-23: qty 1

## 2022-03-23 MED ORDER — SODIUM BICARBONATE 650 MG PO TABS
650.0000 mg | ORAL_TABLET | Freq: Three times a day (TID) | ORAL | Status: DC
  Administered 2022-03-23 – 2022-03-24 (×4): 650 mg via ORAL
  Filled 2022-03-23 (×4): qty 1

## 2022-03-23 NOTE — Care Management Important Message (Signed)
Important Message  Patient Details  Name: Shawna [name] MRN: [id number] Date of Birth: July 17, 1955   Medicare Important Message Given:  N/A - LOS <3 / Initial given by admissions     Tommy Medal 03/23/2022, 10:50 AM

## 2022-03-23 NOTE — Progress Notes (Signed)
Notified Dr. Manuella Ghazi of patient's PVCs on tele monitor. Patient has no s/s symptoms of distress.

## 2022-03-23 NOTE — Plan of Care (Signed)
  Problem: Acute Rehab PT Goals(only PT should resolve) Goal: Pt Will Go Supine/Side To Sit Outcome: Progressing Flowsheets (Taken 03/23/2022 1415) Pt will go Supine/Side to Sit: with modified independence Goal: Patient Will Transfer Sit To/From Stand Outcome: Progressing Flowsheets (Taken 03/23/2022 1415) Patient will transfer sit to/from stand:  with supervision  with modified independence Goal: Pt Will Transfer Bed To Chair/Chair To Bed Outcome: Progressing Flowsheets (Taken 03/23/2022 1415) Pt will Transfer Bed to Chair/Chair to Bed:  min guard assist  with supervision Goal: Pt Will Ambulate Outcome: Progressing Flowsheets (Taken 03/23/2022 1415) Pt will Ambulate:  with min guard assist  with minimal assist  50 feet  with rolling walker   Zigmund Gottron, SPT

## 2022-03-23 NOTE — Progress Notes (Signed)
Called to patient's room.  Patient reports feeling like she is going to pass out. Vitals stable. Patient laying in bed. Color of skin unchanged from earlier. Notified Dr. Manuella Ghazi.

## 2022-03-23 NOTE — Progress Notes (Signed)
PROGRESS NOTE    Shawna [name]  ZSW:[id number] DOB: [birth year] DOA: 03/20/2022 PCP: Celene Squibb, MD   Brief Narrative:    Shawna [name] is a 66 year old female with a history of cognitive delay, hypothyroidism, CKD stage IV, hyperlipidemia, iron deficiency presenting with dizziness, gait instability, and poor oral intake.  She appears to restrict her on caloric and fluid intake and was noted to be orthostatic.  She also had weakness and low back pain for which she underwent MRI evaluation which was nonspecific.  She has been started on IV fluid and midodrine and continues to remain symptomatic.  She is hypomagnesemic which is being replaced and 2D echocardiogram is pending.  PT recommending SNF at this point.  Assessment & Plan:   Principal Problem:   Orthostatic hypotension Active Problems:   Hypothyroid   Chronic pain   Hyperlipidemia   Chronic kidney disease (CKD), stage IV (severe) (HCC)   Failure to thrive in adult   Metabolic acidosis  Assessment and Plan:   Orthostatic hypotension on admission with persistent dizziness Remained orthostatic despite 2 L normal saline in the emergency department -BP went from 143/90>> 114/55 in am 9/26 -Brain MRI and lumbar spine MRI on 9/26 with no significant findings aside from marked cerebellar atrophy which could be contributing to her ongoing dizziness issues -stopped IVF -Started midodrine and increase dose 9/28 -PT recommending SNF   Metabolic acidosis Secondary to CKD Start bicarbonate and monitor  Worsening anemia Check anemia panel; could be symptomatic from this Transfuse for H<7 Monitor CBC No overt bleeding noted   Severe hypomagnesemia Repleted, continue to monitor   Failure to thrive in adult UA negative for pyuria TSH WNL D22 WNL Folic acid WNL PT evaluation with outpt PT   Chronic kidney disease (CKD), stage IV (severe) (HCC) Baseline creatinine 2.2-2.4 Patient follows Dr. Vanetta Mulders    Hyperlipidemia Continue Tricor   Chronic pain Patient has chronic right hip pain for which she intermittently takes NSAIDs -Avoid NSAIDs given CKD -MRI lumbar spine negative for cauda equina or infectious process or cord compression   Hypothyroid Continue Synthroid     DVT prophylaxis:Heparin Code Status: Full Family Communication: Sister at bedside 9/28 Disposition Plan:  Status is: Inpatient Remains inpatient appropriate because: Need for IVF     Consultants:  None   Procedures:  None   Antimicrobials:  None    Subjective: Patient seen and evaluated today with ongoing dizziness with ambulation.  Objective: Vitals:   03/22/22 1137 03/22/22 2158 03/23/22 0521 03/23/22 0552  BP: 118/72 (!) 141/81 (!) 99/53 (!) 110/54  Pulse: 76 80 (!) 47 (!) 58  Resp: (!) 22 20    Temp: 98 F (36.7 C) 99 F (37.2 C) 98 F (36.7 C)   TempSrc: Oral Oral Oral   SpO2: 97% 98% 100%   Weight:      Height:        Intake/Output Summary (Last 24 hours) at 03/23/2022 1218 Last data filed at 03/23/2022 0900 Gross per 24 hour  Intake 1680.28 ml  Output 600 ml  Net 1080.28 ml   Filed Weights   03/20/22 1553 03/21/22 1604  Weight: 48.1 kg 46.9 kg    Examination:  General exam: Appears calm and comfortable  Respiratory system: Clear to auscultation. Respiratory effort normal. Cardiovascular system: S1 & S2 heard, RRR.  Gastrointestinal system: Abdomen is soft Central nervous system: Alert and awake Extremities: No edema Skin: No significant lesions noted Psychiatry: Flat affect.    Data Reviewed:  I have personally reviewed following labs and imaging studies  CBC: Recent Labs  Lab 03/20/22 1645 03/22/22 0755 03/23/22 0449  WBC 5.3 6.7 6.9  HGB 9.0* 7.6* 7.7*  HCT 27.0* 23.1* 23.7*  MCV 97.1 98.7 100.9*  PLT 227 152 951   Basic Metabolic Panel: Recent Labs  Lab 03/20/22 1645 03/22/22 0755 03/23/22 0449  NA 138 139 140  K 4.1 3.4* 3.6  CL 110 115* 118*   CO2 19* 18* 17*  GLUCOSE 111* 105* 69*  BUN 53* 25* 22  CREATININE 2.36* 1.36* 1.31*  CALCIUM 7.8* 6.6* 7.0*  MG  --  0.8* 1.9   GFR: Estimated Creatinine Clearance: 31.7 mL/min (A) (by C-G formula based on SCr of 1.31 mg/dL (H)). Liver Function Tests: No results for input(s): "AST", "ALT", "ALKPHOS", "BILITOT", "PROT", "ALBUMIN" in the last 168 hours. No results for input(s): "LIPASE", "AMYLASE" in the last 168 hours. No results for input(s): "AMMONIA" in the last 168 hours. Coagulation Profile: No results for input(s): "INR", "PROTIME" in the last 168 hours. Cardiac Enzymes: Recent Labs  Lab 03/21/22 1634  CKTOTAL 144   BNP (last 3 results) No results for input(s): "PROBNP" in the last 8760 hours. HbA1C: No results for input(s): "HGBA1C" in the last 72 hours. CBG: No results for input(s): "GLUCAP" in the last 168 hours. Lipid Profile: No results for input(s): "CHOL", "HDL", "LDLCALC", "TRIG", "CHOLHDL", "LDLDIRECT" in the last 72 hours. Thyroid Function Tests: Recent Labs    03/21/22 1634  TSH 0.510  FREET4 0.95   Anemia Panel: Recent Labs    03/21/22 1634  VITAMINB12 504  FOLATE 11.0   Sepsis Labs: No results for input(s): "PROCALCITON", "LATICACIDVEN" in the last 168 hours.  No results found for this or any previous visit (from the past 240 hour(s)).       Radiology Studies: ECHOCARDIOGRAM COMPLETE  Result Date: 03/22/2022    ECHOCARDIOGRAM REPORT   Patient Name:   Shawna [name] Date of Exam: 03/22/2022 Medical Rec #:  [id number]  Height:       64.0 in Accession #:    [id number] Weight:       103.4 lb Date of Birth:  [birth year]   BSA:          1.478 m Patient Age:    53 years   BP:           118/72 mmHg Patient Gender: F          HR:           76 bpm. Exam Location:  [facility] Procedure: 2D Echo, Cardiac Doppler and Color Doppler Indications:    Orthostatic hypotension [458.0.ICD-9-CM]  History:        Patient has no prior history of Echocardiogram  examinations.                 Risk Factors:Dyslipidemia.  Sonographer:    Alvino Chapel RCS Referring Phys: [phone] DAVID TAT IMPRESSIONS  1. Left ventricular ejection fraction, by estimation, is 60 to 65%. The left ventricle has normal function. The left ventricle has no regional wall motion abnormalities. Left ventricular diastolic parameters are consistent with Grade I diastolic dysfunction (impaired relaxation).  2. Right ventricular systolic function is normal. The right ventricular size is normal. There is normal pulmonary artery systolic pressure.  3. Left atrial size was mildly dilated.  4. The mitral valve is normal in structure. Mild mitral valve regurgitation. No evidence of mitral stenosis.  5. The aortic valve is normal  in structure. Aortic valve regurgitation is not visualized. No aortic stenosis is present.  6. The inferior vena cava is normal in size with greater than 50% respiratory variability, suggesting right atrial pressure of 3 mmHg. FINDINGS  Left Ventricle: Left ventricular ejection fraction, by estimation, is 60 to 65%. The left ventricle has normal function. The left ventricle has no regional wall motion abnormalities. The left ventricular internal cavity size was normal in size. There is  no left ventricular hypertrophy. Left ventricular diastolic parameters are consistent with Grade I diastolic dysfunction (impaired relaxation). Right Ventricle: The right ventricular size is normal. No increase in right ventricular wall thickness. Right ventricular systolic function is normal. There is normal pulmonary artery systolic pressure. The tricuspid regurgitant velocity is 2.49 m/s, and  with an assumed right atrial pressure of 3 mmHg, the estimated right ventricular systolic pressure is 94.8 mmHg. Left Atrium: Left atrial size was mildly dilated. Right Atrium: Right atrial size was normal in size. Pericardium: There is no evidence of pericardial effusion. Mitral Valve: The mitral valve is normal in  structure. Mild mitral valve regurgitation. No evidence of mitral valve stenosis. Tricuspid Valve: The tricuspid valve is normal in structure. Tricuspid valve regurgitation is not demonstrated. No evidence of tricuspid stenosis. Aortic Valve: The aortic valve is normal in structure. Aortic valve regurgitation is not visualized. No aortic stenosis is present. Pulmonic Valve: The pulmonic valve was normal in structure. Pulmonic valve regurgitation is not visualized. No evidence of pulmonic stenosis. Aorta: The aortic root is normal in size and structure. Venous: The inferior vena cava is normal in size with greater than 50% respiratory variability, suggesting right atrial pressure of 3 mmHg. IAS/Shunts: No atrial level shunt detected by color flow Doppler.  LEFT VENTRICLE PLAX 2D LVIDd:         3.50 cm   Diastology LVIDs:         2.50 cm   LV e' medial:    6.42 cm/s LV PW:         1.00 cm   LV E/e' medial:  10.0 LV IVS:        1.00 cm   LV e' lateral:   12.20 cm/s LVOT diam:     1.80 cm   LV E/e' lateral: 5.3 LV SV:         59 LV SV Index:   40 LVOT Area:     2.54 cm  RIGHT VENTRICLE RV S prime:     12.50 cm/s TAPSE (M-mode): 1.9 cm LEFT ATRIUM             Index        RIGHT ATRIUM           Index LA diam:        3.55 cm 2.40 cm/m   RA Area:     13.30 cm LA Vol (A2C):   69.3 ml 46.89 ml/m  RA Volume:   28.70 ml  19.42 ml/m LA Vol (A4C):   52.4 ml 35.45 ml/m LA Biplane Vol: 63.5 ml 42.96 ml/m  AORTIC VALVE LVOT Vmax:   95.90 cm/s LVOT Vmean:  68.700 cm/s LVOT VTI:    0.231 m  AORTA Ao Root diam: 3.40 cm MITRAL VALVE               TRICUSPID VALVE MV Area (PHT): 2.99 cm    TR Peak grad:   24.8 mmHg MV Decel Time: 254 msec    TR Vmax:  249.00 cm/s MV E velocity: 64.30 cm/s MV A velocity: 67.90 cm/s  SHUNTS MV E/A ratio:  0.95        Systemic VTI:  0.23 m                            Systemic Diam: 1.80 cm Candee Furbish MD Electronically signed by Candee Furbish MD Signature Date/Time: 03/22/2022/4:31:01 PM    Final          Scheduled Meds:  calcium carbonate  500 mg of elemental calcium Oral BID WC   Chlorhexidine Gluconate Cloth  6 each Topical Daily   heparin  5,000 Units Subcutaneous Q8H   iron polysaccharides  150 mg Oral Daily   levothyroxine  12.5 mcg Oral Q0600   levothyroxine  50 mcg Oral Q0600   midodrine  5 mg Oral TID WC   multivitamin with minerals  1 tablet Oral Daily   sodium bicarbonate  650 mg Oral TID     LOS: 2 days    Time spent: 35 minutes    Wing Gfeller Darleen Crocker, DO [facility]  If 7PM-7AM, please contact night-coverage [email or web address] 03/23/2022, 12:18 PM

## 2022-03-23 NOTE — TOC Progression Note (Signed)
Transition of Care [facility]) - Progression Note    Patient Details  Name: Shawna [name] MRN: [id number] Date of Birth: January 16, 1956  [facility]) CM/SW Contact  Boneta Lucks, RN Phone Number: 03/23/2022, 11:39 AM  Clinical Narrative:   PT is now saying SNF. Daughter wanted [facility], they do not accept [employer]. Sent out for bed offers. Will start INS Auth when we have a confirmed bed. They will not take Auth with facility.   Expected Discharge Plan: Skilled Nursing Facility Barriers to Discharge: No SNF bed, Insurance Authorization  Expected Discharge Plan and Services Expected Discharge Plan: [place]

## 2022-03-23 NOTE — NC FL2 (Signed)
[place] MEDICAID FL2 LEVEL OF CARE SCREENING TOOL     IDENTIFICATION  Patient Name: Shawna [name] Birthdate: 1956-02-14 Sex: female Admission Date (Current Location): 03/20/2022  [facility] and [place] Number:  [employer] and Address:  [facility] [address], [place]      Provider Number: [id number]  Attending Physician Name and Address:  Rodena Goldmann, DO  Relative Name and Phone Number:  Pugh,Elizabeth (Sister)    [phone]    Current Level of Care: Hospital Recommended Level of Care: [place] Prior Approval Number:    Date Approved/Denied:   PASRR Number: [id number]  Discharge Plan: SNF    Current Diagnoses: Patient Active Problem List   Diagnosis Date Noted   Orthostatic hypotension 03/21/2022   Failure to thrive in adult 44/81/8563   Metabolic acidosis 14/97/0263   Chronic kidney disease (CKD), stage IV (severe) ([place]) 09/03/2015   Anemia 06/14/2015   Loss of weight 08/26/2013   Hyperlipidemia 02/08/2012   Osteoporosis 08/10/2011   Chronic pain 01/27/2011   Intellectual disability 01/27/2011   Hypothyroid 01/26/2011    Orientation RESPIRATION BLADDER Height & Weight     Self, Time, Situation, Place  Normal External catheter, Continent Weight: 46.9 kg Height:  5\' 4"  (162.6 cm)  BEHAVIORAL SYMPTOMS/MOOD NEUROLOGICAL BOWEL NUTRITION STATUS      Continent Diet (See DC Summary)  AMBULATORY STATUS COMMUNICATION OF NEEDS Skin   Extensive Assist Verbally Bruising                       Personal Care Assistance Level of Assistance  Bathing, Feeding, Dressing Bathing Assistance: Maximum assistance Feeding assistance: Limited assistance Dressing Assistance: Maximum assistance     Functional Limitations Info  Sight, Hearing, Speech Sight Info: Adequate Hearing Info: Adequate Speech Info: Adequate    SPECIAL CARE FACTORS FREQUENCY  PT (By licensed PT)     PT Frequency: 5 times a week               Contractures Contractures Info: Not present    Additional Factors Info  Code Status, Allergies Code Status Info: FULL Allergies Info: Bisphosphonates           Current Medications (03/23/2022):  This is the current hospital active medication list Current Facility-Administered Medications  Medication Dose Route Frequency Provider Last Rate Last Admin   acetaminophen (TYLENOL) tablet 650 mg  650 mg Oral Q6H PRN Tat, David, MD   650 mg at 03/22/22 2206   Or   acetaminophen (TYLENOL) suppository 650 mg  650 mg Rectal Q6H PRN Tat, Shanon Brow, MD       calcium carbonate (OS-CAL - dosed in mg of elemental calcium) tablet 1,250 mg  500 mg of elemental calcium Oral BID WC Orson Eva, MD   1,250 mg at 03/23/22 0846   Chlorhexidine Gluconate Cloth 2 % PADS 6 each  6 each Topical Daily Heath Lark D, DO   6 each at 03/23/22 0846   heparin injection 5,000 Units  5,000 Units Subcutaneous Franco Collet, MD   5,000 Units at 03/23/22 0518   iron polysaccharides (NIFEREX) capsule 150 mg  150 mg Oral Daily Tat, Shanon Brow, MD   150 mg at 03/23/22 0846   levothyroxine (SYNTHROID) tablet 12.5 mcg  12.5 mcg Oral Q0600 Orson Eva, MD   12.5 mcg at 03/23/22 0519   levothyroxine (SYNTHROID) tablet 50 mcg  50 mcg Oral Q0600 Orson Eva, MD   50 mcg at  03/23/22 0518   midodrine (PROAMATINE) tablet 5 mg  5 mg Oral TID WC Orson Eva, MD   5 mg at 03/23/22 0846   multivitamin with minerals tablet 1 tablet  1 tablet Oral Daily Tat, David, MD   1 tablet at 03/23/22 0846   ondansetron (ZOFRAN) tablet 4 mg  4 mg Oral Q6H PRN Tat, Shanon Brow, MD       Or   ondansetron [facility]) injection 4 mg  4 mg Intravenous Q6H PRN Orson Eva, MD   4 mg at 03/22/22 1147     Discharge Medications: Please see discharge summary for a list of discharge medications.  Relevant Imaging Results:  Relevant Lab Results:   Additional Information SS# [id number]  Boneta Lucks, RN

## 2022-03-23 NOTE — Progress Notes (Signed)
Bladder scan less than 147ml.

## 2022-03-23 NOTE — Progress Notes (Signed)
Physical Therapy Treatment Patient Details Name: Shawna [name] MRN: [id number] DOB: November 29, 1955 Today's Date: 03/23/2022   History of Present Illness Shawna [name] is a 66 year old female with a history of cognitive delay, hypothyroidism, CKD stage IV, hyperlipidemia, iron deficiency presenting with dizziness, gait instability, and poor oral intake.  Admitted 03/20/22 with orthostatic hypotension; failure to thrive; and Metabolic acidosis.    PT Comments    REASSESSMENT: Patient with supine BP reading of 110/53 mmHg in supine, demonstrating slow labored movement for sitting up at bedside. Patient with c/o dizziness upon sitting likely due to orthostatic hypotension with BP at 110/66 mmHg in sitting. Patient unable to stand long enough for a standing BP reading, but patient able to take a few labored steps forward/backward with RW before having to sit with BP at 90/60 mmHg in sitting following exertion. Patient tolerated sitting up in chair after therapy. Patient will benefit from continued skilled physical therapy in hospital and recommended venue below to increase strength, balance, endurance for safe ADLs and gait.    Recommendations for follow up therapy are one component of a multi-disciplinary discharge planning process, led by the attending physician.  Recommendations may be updated based on patient status, additional functional criteria and insurance authorization.  Follow Up Recommendations  Skilled nursing-short term rehab (<3 hours/day) Can patient physically be transported by private vehicle: Yes   Assistance Recommended at Discharge Intermittent Supervision/Assistance  Patient can return home with the following A little help with bathing/dressing/bathroom;Help with stairs or ramp for entrance;Assistance with cooking/housework;A lot of help with walking and/or transfers   Equipment Recommendations  None recommended by PT    Recommendations for Other Services       Precautions /  Restrictions Precautions Precautions: Fall Restrictions Weight Bearing Restrictions: No     Mobility  Bed Mobility Overal bed mobility: Needs Assistance Bed Mobility: Supine to Sit     Supine to sit: Supervision, Min guard     General bed mobility comments: slow, labored movement with c/o dizziness upon sitting    Transfers Overall transfer level: Needs assistance Equipment used: Rolling walker (2 wheels) Transfers: Sit to/from Stand, Bed to chair/wheelchair/BSC Sit to Stand: Min guard, Min assist   Step pivot transfers: Min guard, Min assist       General transfer comment: Patient needed to sit back down after first sit to stand, unable to take BP in standing, but after resting patient able to take a few steps and then sit in chair    Ambulation/Gait Ambulation/Gait assistance: Min assist, Mod assist Gait Distance (Feet): 12 Feet Assistive device: Rolling walker (2 wheels) Gait Pattern/deviations: Decreased step length - right, Decreased step length - left, Decreased stride length, Trunk flexed Gait velocity: decreased     General Gait Details: patient able to take several steps forward and backward in room, very unsteady on feet, requires use of RW for safety   Stairs             Wheelchair Mobility    Modified Rankin (Stroke Patients Only)       Balance Overall balance assessment: Needs assistance Sitting-balance support: Feet supported, Bilateral upper extremity supported Sitting balance-Leahy Scale: Fair Sitting balance - Comments: fair/good seated EOB     Standing balance-Leahy Scale: Fair Standing balance comment: fair/poor requiring use of RW                            Cognition Arousal/Alertness: Awake/alert Behavior During Therapy: [facility]  for tasks assessed/performed Overall Cognitive Status: History of cognitive impairments - at baseline                                          Exercises      General  Comments        Pertinent Vitals/Pain Pain Assessment Pain Assessment: Faces Faces Pain Scale: Hurts a little bit Pain Intervention(s): Limited activity within patient's tolerance, Monitored during session, Repositioned    Home Living                          Prior Function            PT Goals (current goals can now be found in the care plan section) Acute Rehab PT Goals Patient Stated Goal: return home with family to assist PT Goal Formulation: With patient Time For Goal Achievement: 04/06/22 Potential to Achieve Goals: Good Progress towards PT goals: Progressing toward goals    Frequency    Min 3X/week      PT Plan Discharge plan needs to be updated    Co-evaluation              AM-PAC PT "6 Clicks" Mobility   Outcome Measure  Help needed turning from your back to your side while in a flat bed without using bedrails?: None Help needed moving from lying on your back to sitting on the side of a flat bed without using bedrails?: A Little Help needed moving to and from a bed to a chair (including a wheelchair)?: A Lot Help needed standing up from a chair using your arms (e.g., wheelchair or bedside chair)?: A Lot Help needed to walk in hospital room?: A Lot Help needed climbing 3-5 steps with a railing? : A Lot 6 Click Score: 15    End of Session   Activity Tolerance: Patient tolerated treatment well;Patient limited by fatigue;Treatment limited secondary to medical complications (Comment) (Symptomatic orthostatic hypotension) Patient left: in bed;with call bell/phone within reach Nurse Communication: Mobility status;Other (comment) (Positional blood pressure readings) PT Visit Diagnosis: Unsteadiness on feet (R26.81);Muscle weakness (generalized) (M62.81);Other abnormalities of gait and mobility (R26.89)     Time: 3709-6438 PT Time Calculation (min) (ACUTE ONLY): 31 min  Charges:  $Therapeutic Activity: 23-37 mins                     Zigmund Gottron, SPT

## 2022-03-24 DIAGNOSIS — R69 Illness, unspecified: Secondary | ICD-10-CM | POA: Diagnosis not present

## 2022-03-24 DIAGNOSIS — I951 Orthostatic hypotension: Secondary | ICD-10-CM | POA: Diagnosis not present

## 2022-03-24 DIAGNOSIS — Z7401 Bed confinement status: Secondary | ICD-10-CM | POA: Diagnosis not present

## 2022-03-24 DIAGNOSIS — D649 Anemia, unspecified: Secondary | ICD-10-CM | POA: Diagnosis not present

## 2022-03-24 DIAGNOSIS — M859 Disorder of bone density and structure, unspecified: Secondary | ICD-10-CM | POA: Diagnosis not present

## 2022-03-24 DIAGNOSIS — R5381 Other malaise: Secondary | ICD-10-CM | POA: Diagnosis not present

## 2022-03-24 DIAGNOSIS — Z681 Body mass index (BMI) 19 or less, adult: Secondary | ICD-10-CM | POA: Diagnosis not present

## 2022-03-24 DIAGNOSIS — N183 Chronic kidney disease, stage 3 unspecified: Secondary | ICD-10-CM | POA: Diagnosis not present

## 2022-03-24 DIAGNOSIS — N184 Chronic kidney disease, stage 4 (severe): Secondary | ICD-10-CM | POA: Diagnosis not present

## 2022-03-24 DIAGNOSIS — E039 Hypothyroidism, unspecified: Secondary | ICD-10-CM | POA: Diagnosis not present

## 2022-03-24 DIAGNOSIS — M1990 Unspecified osteoarthritis, unspecified site: Secondary | ICD-10-CM | POA: Diagnosis not present

## 2022-03-24 DIAGNOSIS — G8929 Other chronic pain: Secondary | ICD-10-CM | POA: Diagnosis not present

## 2022-03-24 DIAGNOSIS — E785 Hyperlipidemia, unspecified: Secondary | ICD-10-CM | POA: Diagnosis not present

## 2022-03-24 DIAGNOSIS — Z743 Need for continuous supervision: Secondary | ICD-10-CM | POA: Diagnosis not present

## 2022-03-24 DIAGNOSIS — D631 Anemia in chronic kidney disease: Secondary | ICD-10-CM | POA: Diagnosis not present

## 2022-03-24 DIAGNOSIS — R627 Adult failure to thrive: Secondary | ICD-10-CM | POA: Diagnosis not present

## 2022-03-24 DIAGNOSIS — R531 Weakness: Secondary | ICD-10-CM | POA: Diagnosis not present

## 2022-03-24 DIAGNOSIS — M479 Spondylosis, unspecified: Secondary | ICD-10-CM | POA: Diagnosis not present

## 2022-03-24 DIAGNOSIS — E46 Unspecified protein-calorie malnutrition: Secondary | ICD-10-CM | POA: Diagnosis not present

## 2022-03-24 DIAGNOSIS — M810 Age-related osteoporosis without current pathological fracture: Secondary | ICD-10-CM | POA: Diagnosis not present

## 2022-03-24 LAB — BASIC METABOLIC PANEL
Anion gap: 5 (ref 5–15)
BUN: 20 mg/dL (ref 8–23)
CO2: 21 mmol/L — ABNORMAL LOW (ref 22–32)
Calcium: 7.5 mg/dL — ABNORMAL LOW (ref 8.9–10.3)
Chloride: 113 mmol/L — ABNORMAL HIGH (ref 98–111)
Creatinine, Ser: 1.25 mg/dL — ABNORMAL HIGH (ref 0.44–1.00)
GFR, Estimated: 48 mL/min — ABNORMAL LOW (ref >60)
Glucose, Bld: 89 mg/dL (ref 70–99)
Potassium: 3.5 mmol/L (ref 3.5–5.1)
Sodium: 139 mmol/L (ref 135–145)

## 2022-03-24 LAB — CBC
HCT: 22.8 % — ABNORMAL LOW (ref 36.0–46.0)
Hemoglobin: 7.5 g/dL — ABNORMAL LOW (ref 12.0–15.0)
MCH: 32.9 pg (ref 26.0–34.0)
MCHC: 32.9 g/dL (ref 30.0–36.0)
MCV: 100 fL (ref 80.0–100.0)
Platelets: 168 10*3/uL (ref 150–400)
RBC: 2.28 MIL/uL — ABNORMAL LOW (ref 3.87–5.11)
RDW: 13.2 % (ref 11.5–15.5)
WBC: 4.5 10*3/uL (ref 4.0–10.5)
nRBC: 0 % (ref 0.0–0.2)

## 2022-03-24 LAB — MAGNESIUM: Magnesium: 1.6 mg/dL — ABNORMAL LOW (ref 1.7–2.4)

## 2022-03-24 MED ORDER — ENSURE MAX PROTEIN PO LIQD
11.0000 [oz_av] | Freq: Every day | ORAL | Status: DC
  Administered 2022-03-24: 11 [oz_av] via ORAL

## 2022-03-24 MED ORDER — ENSURE MAX PROTEIN PO LIQD: 11.0000 [oz_av] | Freq: Every day | ORAL | 0 refills | Status: AC

## 2022-03-24 MED ORDER — SODIUM BICARBONATE 650 MG PO TABS: 650.0000 mg | ORAL_TABLET | Freq: Three times a day (TID) | ORAL | 0 refills | Status: AC

## 2022-03-24 MED ORDER — MIDODRINE HCL 10 MG PO TABS: 10.0000 mg | ORAL_TABLET | Freq: Three times a day (TID) | ORAL | 0 refills | Status: AC

## 2022-03-24 NOTE — TOC Transition Note (Signed)
Transition of Care [facility]) - CM/SW Discharge Note   Patient Details  Name: Shawna [name] MRN: [id number] Date of Birth: 1956/01/12  [facility]) CM/SW Contact:  Boneta Lucks, RN Phone Number: 03/24/2022, 2:58 PM   Clinical Narrative:    [facility] called, patient has traditional medicare and [employer] as supplement. They will accept her today. Requested DC summary asap, MD completed promptly and TOC sent in the hub. RN called report. Confirmed with Abigail Butts they received, Patient will go to [name]. EMS scheduled.     Final next level of care: Skilled Nursing Facility Barriers to Discharge: Barriers Resolved   Patient Goals and CMS Choice Patient states their goals for this hospitalization and ongoing recovery are:: Agreeable to SNF CMS Medicare.gov Compare Post Acute Care list provided to:: Patient Represenative (must comment) Choice offered to / list presented to : Sibling  Discharge Placement                Patient to be transferred to facility by: EMS Name of family member notified: Sister Patient and family notified of of transfer: 03/24/22  Discharge Plan and [place], [place]

## 2022-03-24 NOTE — Progress Notes (Signed)
Physical Therapy Treatment Patient Details Name: Shawna [name] MRN: [id number] DOB: 07-15-1955 Today's Date: 03/24/2022   History of Present Illness Shawna [name] is a 66 year old female with a history of cognitive delay, hypothyroidism, CKD stage IV, hyperlipidemia, iron deficiency presenting with dizziness, gait instability, and poor oral intake.  Admitted 03/20/22 with orthostatic hypotension; failure to thrive; and Metabolic acidosis.    PT Comments    Some progress made, still limited due to symptomatic orthostatics (see below). Pt tolerated short distance ambulation in room with RW. Reviewed exercises to be performed during admission to slow muscular atrophy due to immobility. Patient will continue to benefit from skilled physical therapy services to further improve independence with functional mobility.    Recommendations for follow up therapy are one component of a multi-disciplinary discharge planning process, led by the attending physician.  Recommendations may be updated based on patient status, additional functional criteria and insurance authorization.  Follow Up Recommendations  Skilled nursing-short term rehab (<3 hours/day) Can patient physically be transported by private vehicle: Yes   Assistance Recommended at Discharge Intermittent Supervision/Assistance  Patient can return home with the following A little help with bathing/dressing/bathroom;Help with stairs or ramp for entrance;Assistance with cooking/housework;A lot of help with walking and/or transfers   Equipment Recommendations  None recommended by PT    Recommendations for Other Services       Precautions / Restrictions Precautions Precautions: Fall Precaution Comments: Monitor BP (orthostatic) Restrictions Weight Bearing Restrictions: No     Mobility  Bed Mobility               General bed mobility comments: Sitting in recliner when therapist entered room    Transfers Overall transfer level: Needs  assistance Equipment used: Rolling walker (2 wheels) Transfers: Sit to/from Stand Sit to Stand: Min guard           General transfer comment: Min guard for safety, able to perform x2 today from recliner. Slow to rise but steady with RW for support upon standing. Cues for hand placement.    Ambulation/Gait Ambulation/Gait assistance: Min guard Gait Distance (Feet): 10 Feet Assistive device: Rolling walker (2 wheels) Gait Pattern/deviations: Decreased step length - right, Decreased step length - left, Decreased stride length, Trunk flexed Gait velocity: decreased     General Gait Details: Min guard for safety, closely monitoring symptoms and HR (up to 97 with short distance.) Mild dizziness reported, resoves quickly upon sitting. Performed with RW in room ,cues for proximity to device.   Stairs             Wheelchair Mobility    Modified Rankin (Stroke Patients Only)       Balance Overall balance assessment: Needs assistance Sitting-balance support: Feet supported, Bilateral upper extremity supported Sitting balance-Leahy Scale: Fair Sitting balance - Comments: fair/good seated EOB     Standing balance-Leahy Scale: Fair Standing balance comment: fair/poor requiring use of RW                            Cognition Arousal/Alertness: Awake/alert Behavior During Therapy: WFL for tasks assessed/performed Overall Cognitive Status: History of cognitive impairments - at baseline                                          Exercises General Exercises - Lower Extremity Ankle Circles/Pumps: AROM, Both, 10 reps, Supine Gluteal  Sets: Strengthening, Both, 10 reps, Supine Heel Slides: AROM, Both, Supine, 10 reps Hip ABduction/ADduction: AROM, Both, Supine, 10 reps Hip Flexion/Marching: Strengthening, Both, 10 reps, Seated    General Comments General comments (skin integrity, edema, etc.): Orthostatics: Reclined in chair BP 162/95 HR 70; sitting  edge of chair BP 169/98 HR 75 (dizzy); Standing BP 127/72 HR 87 (very dizzy) but tolerated standing approx 1 min for BP to be obtained.      Pertinent Vitals/Pain Pain Assessment Pain Assessment: No/denies pain Pain Intervention(s): Monitored during session, Repositioned    Home Living                          Prior Function            PT Goals (current goals can now be found in the care plan section) Acute Rehab PT Goals Patient Stated Goal: return home with family to assist PT Goal Formulation: With patient Time For Goal Achievement: 04/06/22 Potential to Achieve Goals: Good Progress towards PT goals: Progressing toward goals    Frequency    Min 3X/week      PT Plan Current plan remains appropriate    Co-evaluation              AM-PAC PT "6 Clicks" Mobility   Outcome Measure  Help needed turning from your back to your side while in a flat bed without using bedrails?: None Help needed moving from lying on your back to sitting on the side of a flat bed without using bedrails?: A Little Help needed moving to and from a bed to a chair (including a wheelchair)?: A Little Help needed standing up from a chair using your arms (e.g., wheelchair or bedside chair)?: A Little Help needed to walk in hospital room?: A Little Help needed climbing 3-5 steps with a railing? : A Lot 6 Click Score: 18    End of Session Equipment Utilized During Treatment: Gait belt Activity Tolerance: Treatment limited secondary to medical complications (Comment) (Symptomatic orthostatic hypotension) Patient left: with call bell/phone within reach;in chair   PT Visit Diagnosis: Unsteadiness on feet (R26.81);Muscle weakness (generalized) (M62.81);Other abnormalities of gait and mobility (R26.89) Pain - Right/Left: Right Pain - part of body: Hip     Time: 3435-6861 PT Time Calculation (min) (ACUTE ONLY): 16 min  Charges:  $Therapeutic Activity: 8-22 mins                      Candie Mile, PT, DPT Physical Therapist [facility] [place]   03/24/2022, 12:12 PM

## 2022-03-24 NOTE — Progress Notes (Signed)
Patient left in stable condition via stretcher with EMS shortly after report was given. Prior nurse called report to [facility] in [place].

## 2022-03-24 NOTE — Progress Notes (Signed)
Initial Nutrition Assessment  DOCUMENTATION CODES:      INTERVENTION:  Regular diet   Snacks ad lib  Ensure MAX daily  NUTRITION DIAGNOSIS:   Predicted suboptimal nutrient intake related to chronic illness as evidenced by mild muscle depletion, moderate muscle depletion.   GOAL:  Patient will meet greater than or equal to 90% of their needs  MONITOR:  PO intake, Supplement acceptance, Labs  REASON FOR ASSESSMENT:   Malnutrition Screening Tool    ASSESSMENT: Patient is a 66 yo underweight female with hx of cognitive impairment, CKD-IV, iron deficiency anemia, failure to thrive, severe hypomagnesemia and chronic pain. Poor appetite reported.   Patient is up in chair. She ate blueberry muffin and sausage with carton of milk for breakfast. She doesn't like eggs or coffee. Able to feed herself. Lives with her sister. Patient affirms 3 meals daily at home. Difficult historian.   Weights: no acute change - range of 47-48 kg the past 2 years. Suspect chronic undernutrition/malnutrition.   Medications: Niferex, Synthroid, MVI     Latest Ref Rng & Units 03/24/2022    6:17 AM 03/23/2022    4:49 AM 03/22/2022    7:55 AM  BMP  Glucose 70 - 99 mg/dL 89  69  105   BUN 8 - 23 mg/dL 20  22  25    Creatinine 0.44 - 1.00 mg/dL 1.25  1.31  1.36   Sodium 135 - 145 mmol/L 139  140  139   Potassium 3.5 - 5.1 mmol/L 3.5  3.6  3.4   Chloride 98 - 111 mmol/L 113  118  115   CO2 22 - 32 mmol/L 21  17  18    Calcium 8.9 - 10.3 mg/dL 7.5  7.0  6.6      NUTRITION - FOCUSED PHYSICAL EXAM:  Nutrition-Focused physical exam completed. Findings are minimal fat reserves, mild temporal, moderate clavicle muscle depletion, and mild edema.    Diet Order:   Diet Order             Diet regular Room service appropriate? Yes; Fluid consistency: Thin  Diet effective now                   EDUCATION NEEDS:  Not appropriate for education at this time  Skin:  Skin Assessment: Reviewed RN  Assessment  Last BM:  9/29 medium type 2  Height:   Ht Readings from Last 1 Encounters:  03/21/22 5\' 4"  (1.626 m)    Weight:   Wt Readings from Last 1 Encounters:  03/21/22 46.9 kg    Ideal Body Weight:   55 kg  BMI:  Body mass index is 17.75 kg/m.  Estimated Nutritional Needs:   Kcal:  1600-1700  Protein:  78-82 gr  Fluid:  1500 ml daily   Colman Cater MS,RD,CSG,LDN Contact: Shea Evans

## 2022-03-24 NOTE — Discharge Summary (Signed)
Physician Discharge Summary  Shawna [name] ZOX:[id number] DOB: January 17, 1956 DOA: 03/20/2022  PCP: Celene Squibb, MD  Admit date: 03/20/2022  Discharge date: 03/24/2022  Admitted From:Home  Disposition:  SNF  Recommendations for Outpatient Follow-up:  Follow up with PCP in 1-2 weeks Continue on midodrine as noted below for orthostasis Continue sodium bicarbonate for metabolic acidosis in the setting of stage IV CKD and follow-up with nephrology outpatient Would recommend eventual neurology/ENT follow-up if dizziness persist despite rehabilitation  Home Health: None  Equipment/Devices: None  Discharge Condition:Stable  CODE STATUS: Full  Diet recommendation: Heart Healthy  Brief/Interim Summary: Shawna [name] is a 66 year old female with a history of cognitive delay, hypothyroidism, CKD stage IV, hyperlipidemia, iron deficiency presenting with dizziness, gait instability, and poor oral intake.  She appears to restrict her on caloric and fluid intake and was noted to be orthostatic.  She also had weakness and low back pain for which she underwent MRI evaluation which was nonspecific aside from some marked cerebellar atrophy which could be contributing to her ongoing dizziness issues..  She has been started on IV fluid and midodrine and continues to remain symptomatic.  2D echocardiogram was performed with preserved LV function.  She was seen by physical therapy with recommendations to go to rehab facility on account of her significant weakness and debility as well as dizziness issues which make her a very high fall risk.  She continues to remain on midodrine with stable blood pressure readings and no longer is orthostatic.  Discharge Diagnoses:  Principal Problem:   Orthostatic hypotension Active Problems:   Hypothyroid   Chronic pain   Hyperlipidemia   Chronic kidney disease (CKD), stage IV (severe) (HCC)   Failure to thrive in adult   Metabolic acidosis  Principal discharge diagnosis:  Persistent dizziness with initial orthostasis and noted marked cerebellar atrophy on brain MRI.  Discharge Instructions  Discharge Instructions     Diet - low sodium heart healthy   Complete by: As directed    Increase activity slowly   Complete by: As directed       Allergies as of 03/24/2022       Reactions   Bisphosphonates    Irritation of abdominal lining and oropharynx        Medication List     STOP taking these medications    oxyCODONE 5 MG immediate release tablet Commonly known as: Oxy IR/ROXICODONE       TAKE these medications    calcium gluconate 500 MG tablet Take 500 mg by mouth 2 (two) times daily.   Centrum Silver Chew Chew 1 tablet by mouth daily.   Ensure Max Protein Liqd Take 330 mLs (11 oz total) by mouth daily. Start taking on: March 25, 2022   fenofibrate 48 MG tablet Commonly known as: TRICOR TAKE ONE (1) TABLET BY MOUTH EVERY DAY FOR CHOLESTEROL   iron polysaccharides 150 MG capsule Commonly known as: Nu-Iron Take 1 capsule (150 mg total) by mouth daily.   levothyroxine 25 MCG tablet Commonly known as: SYNTHROID TAKE ONE-HALF TABLET BY MOUTH ALONG WITH50MCG DAILY TO GIVE A TOTAL OF 62.5MCG   levothyroxine 50 MCG tablet Commonly known as: SYNTHROID TAKE ONE TABLET BY MOUTH ALONG WITH ONE-HALF TO THE 25MCG TO EQUAL 62.5MCG EVERY MORNING   midodrine 10 MG tablet Commonly known as: PROAMATINE Take 1 tablet (10 mg total) by mouth 3 (three) times daily with meals.   sodium bicarbonate 650 MG tablet Take 1 tablet (650 mg total) by mouth 3 (  three) times daily.        Contact information for follow-up providers     Celene Squibb, MD. Schedule an appointment as soon as possible for a visit .   Specialty: Internal Medicine Why: As needed Contact information: [facility] [address] [phone]         Stoneking, Reece Leader., MD. Schedule an appointment as soon as possible for a visit .   Specialty:  Urology Contact information: [place] [address] [phone]              Contact information for after-discharge care     [facility] .   Service: Skilled Nursing Contact information: [place] [phone]                    Allergies  Allergen Reactions   Bisphosphonates     Irritation of abdominal lining and oropharynx    Consultations: None   Procedures/Studies: ECHOCARDIOGRAM COMPLETE  Result Date: 03/22/2022    ECHOCARDIOGRAM REPORT   Patient Name:   Shawna [name] Date of Exam: 03/22/2022 Medical Rec #:  [id number]  Height:       64.0 in Accession #:    [id number] Weight:       103.4 lb Date of Birth:  05-May-1956   BSA:          1.478 m Patient Age:    66 years   BP:           118/72 mmHg Patient Gender: F          HR:           76 bpm. Exam Location:  [facility] Procedure: 2D Echo, Cardiac Doppler and Color Doppler Indications:    Orthostatic hypotension [458.0.ICD-9-CM]  History:        Patient has no prior history of Echocardiogram examinations.                 Risk Factors:Dyslipidemia.  Sonographer:    Alvino Chapel RCS Referring Phys: [phone] DAVID TAT IMPRESSIONS  1. Left ventricular ejection fraction, by estimation, is 60 to 65%. The left ventricle has normal function. The left ventricle has no regional wall motion abnormalities. Left ventricular diastolic parameters are consistent with Grade I diastolic dysfunction (impaired relaxation).  2. Right ventricular systolic function is normal. The right ventricular size is normal. There is normal pulmonary artery systolic pressure.  3. Left atrial size was mildly dilated.  4. The mitral valve is normal in structure. Mild mitral valve regurgitation. No evidence of mitral stenosis.  5. The aortic valve is normal in structure. Aortic valve regurgitation is not visualized. No aortic stenosis is present.  6. The  inferior vena cava is normal in size with greater than 50% respiratory variability, suggesting right atrial pressure of 3 mmHg. FINDINGS  Left Ventricle: Left ventricular ejection fraction, by estimation, is 60 to 65%. The left ventricle has normal function. The left ventricle has no regional wall motion abnormalities. The left ventricular internal cavity size was normal in size. There is  no left ventricular hypertrophy. Left ventricular diastolic parameters are consistent with Grade I diastolic dysfunction (impaired relaxation). Right Ventricle: The right ventricular size is normal. No increase in right ventricular wall thickness. Right ventricular systolic function is normal. There is normal pulmonary artery systolic pressure. The tricuspid regurgitant velocity is 2.49 m/s, and  with an assumed right atrial pressure of 3 mmHg, the estimated right ventricular systolic pressure is 39.0 mmHg. Left Atrium: Left atrial size was mildly dilated. Right Atrium: Right atrial size was normal in size. Pericardium: There is no evidence of pericardial effusion. Mitral Valve: The mitral valve is normal in structure. Mild mitral valve regurgitation. No evidence of mitral valve stenosis. Tricuspid Valve: The tricuspid valve is normal in structure. Tricuspid valve regurgitation is not demonstrated. No evidence of tricuspid stenosis. Aortic Valve: The aortic valve is normal in structure. Aortic valve regurgitation is not visualized. No aortic stenosis is present. Pulmonic Valve: The pulmonic valve was normal in structure. Pulmonic valve regurgitation is not visualized. No evidence of pulmonic stenosis. Aorta: The aortic root is normal in size and structure. Venous: The inferior vena cava is normal in size with greater than 50% respiratory variability, suggesting right atrial pressure of 3 mmHg. IAS/Shunts: No atrial level shunt detected by color flow Doppler.  LEFT VENTRICLE PLAX 2D LVIDd:         3.50 cm   Diastology LVIDs:          2.50 cm   LV e' medial:    6.42 cm/s LV PW:         1.00 cm   LV E/e' medial:  10.0 LV IVS:        1.00 cm   LV e' lateral:   12.20 cm/s LVOT diam:     1.80 cm   LV E/e' lateral: 5.3 LV SV:         59 LV SV Index:   40 LVOT Area:     2.54 cm  RIGHT VENTRICLE RV S prime:     12.50 cm/s TAPSE (M-mode): 1.9 cm LEFT ATRIUM             Index        RIGHT ATRIUM           Index LA diam:        3.55 cm 2.40 cm/m   RA Area:     13.30 cm LA Vol (A2C):   69.3 ml 46.89 ml/m  RA Volume:   28.70 ml  19.42 ml/m LA Vol (A4C):   52.4 ml 35.45 ml/m LA Biplane Vol: 63.5 ml 42.96 ml/m  AORTIC VALVE LVOT Vmax:   95.90 cm/s LVOT Vmean:  68.700 cm/s LVOT VTI:    0.231 m  AORTA Ao Root diam: 3.40 cm MITRAL VALVE               TRICUSPID VALVE MV Area (PHT): 2.99 cm    TR Peak grad:   24.8 mmHg MV Decel Time: 254 msec    TR Vmax:        249.00 cm/s MV E velocity: 64.30 cm/s MV A velocity: 67.90 cm/s  SHUNTS MV E/A ratio:  0.95        Systemic VTI:  0.23 m                            Systemic Diam: 1.80 cm Candee Furbish MD Electronically signed by Candee Furbish MD Signature Date/Time: 03/22/2022/4:31:01 PM    Final    MR BRAIN WO CONTRAST  Result Date: 03/21/2022 CLINICAL DATA:  Neuro deficit, stroke suspected EXAM: MRI HEAD WITHOUT CONTRAST TECHNIQUE: Multiplanar, multiecho pulse sequences of the brain and surrounding structures were obtained without intravenous contrast. COMPARISON:  CT head 08/19/2010, brain MRI 08/27/2007 FINDINGS: Brain: There is  no acute intracranial hemorrhage, extra-axial fluid collection, or acute infarct. Supratentorial parenchymal volume is normal. There is disproportionate cerebellar atrophy. Disproportionate cerebellar atrophy was also present on the study from 2009 and has not significantly changed in the interim. The ventricles are normal in size. Gray-white differentiation is preserved. There is patchy FLAIR signal abnormality in the supratentorial white matter which is nonspecific but likely reflects  sequela of chronic small vessel ischemic change There is no mass lesion.  There is no mass effect or midline shift. Vascular: Normal flow voids. Skull and upper cervical spine: Normal marrow signal. Sinuses/Orbits: There is mild mucosal thickening in the paranasal sinuses. The globes and orbits are unremarkable. Other: None. IMPRESSION: 1. No acute intracranial pathology. 2. Disproportionate cerebellar atrophy is not significantly changed since 2009. Electronically Signed   By: Valetta Mole M.D.   On: 03/21/2022 11:31   MR LUMBAR SPINE WO CONTRAST  Result Date: 03/21/2022 CLINICAL DATA:  Low back pain EXAM: MRI LUMBAR SPINE WITHOUT CONTRAST TECHNIQUE: Multiplanar, multisequence MR imaging of the lumbar spine was performed. No intravenous contrast was administered. COMPARISON:  Same-day CT lumbar spine FINDINGS: Segmentation: Standard; the lowest formed disc space is designated L5-S1. Alignment:  Normal. Vertebrae: Vertebral body heights are preserved. Marrow signal is somewhat heterogeneous but within normal limits. There is no suspicious marrow signal abnormality or marrow edema. Conus medullaris and cauda equina: Conus extends to the L1-L2 level. Conus and cauda equina appear normal. Paraspinal and other soft tissues: Unremarkable. Disc levels: There is overall mild multilevel disc degeneration throughout the lumbar spine T12-L1: Imaged in the sagittal projection only. There is a small central disc protrusion without significant spinal canal or neural foraminal stenosis L1-L2: There is a mild disc bulge eccentric to the right and mild facet arthropathy resulting in mild right and no significant left neural foraminal stenosis and no significant spinal canal stenosis L2-L3: There is a mild disc bulge eccentric to the right and mild right worse than left facet arthropathy without significant spinal canal or neural foraminal stenosis L3-L4: There is a mild disc bulge and mild bilateral facet arthropathy without  significant spinal canal or neural foraminal stenosis L4-L5: There is a mild disc bulge eccentric to the left and mild facet arthropathy resulting in mild left and no significant right neural foraminal stenosis and no significant spinal canal stenosis. L5-S1: There is advanced left facet arthropathy with a small effusion and a minimal disc bulge eccentric to the left resulting in mild left and no significant right neural foraminal stenosis and no significant spinal canal stenosis. Mild left subarticular zone narrowing without evidence of nerve root impingement. IMPRESSION: 1. Advanced facet arthropathy with a small effusion at L5-S1. Minimal disc bulge eccentric to the left at this level with mild left subarticular zone narrowing but no evidence of nerve root impingement. 2. Overall mild degenerative changes elsewhere in the lumbar spine as above without evidence of nerve root impingement. 3. No acute finding. Electronically Signed   By: Valetta Mole M.D.   On: 03/21/2022 11:27   CT L-SPINE NO CHARGE  Result Date: 03/21/2022 CLINICAL DATA:  66 year old female with unexplained urinary retention. EXAM: CT LUMBAR SPINE WITHOUT CONTRAST TECHNIQUE: Technique: Multiplanar CT images of the lumbar spine were reconstructed from contemporary CT of the Abdomen and Pelvis. RADIATION DOSE REDUCTION: This exam was performed according to the departmental dose-optimization program which includes automated exposure control, adjustment of the mA and/or kV according to patient size and/or use of iterative reconstruction technique. CONTRAST:  None COMPARISON:  CT Abdomen and Pelvis today are reported separately. And CT Abdomen and Pelvis 01/24/2018. FINDINGS: Segmentation: Normal. Alignment: Lumbar lordosis appears stable since 2019. No spondylolisthesis. No scoliosis. 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 Osteopenia. No acute osseous abnormality identified. Intact visible sacrum  and SI joints. Paraspinal and other soft tissues: Abdomen and pelvis are detailed separately. The lumbar paraspinal soft tissues are within normal limits. Disc levels: T12-L1:  Negative. L1-L2: Mild circumferential disc bulging eccentric to the right. No spinal stenosis. Mild right L1 foraminal stenosis. L2-L3: Mild circumferential disc bulging. Mild facet hypertrophy. No spinal stenosis. Borderline to mild L2 foraminal stenosis. L3-L4: Mild disc bulging. Mild posterior element hypertrophy. No stenosis. L4-L5: Mild disc bulging. Mild to moderate facet and ligament flavum hypertrophy. No significant spinal stenosis. Borderline to mild L4 foraminal stenosis. L5-S1: Circumferential disc bulge with a broad-based posterior component eccentric to the left on series 7, image 88. Moderate facet hypertrophy greater on the left. No significant spinal stenosis. But mild or moderate left lateral recess stenosis (left S1 nerve level). Only mild left L5 foraminal stenosis. IMPRESSION: 1. No acute osseous abnormality and no CT evidence of lumbar spinal stenosis. 2. Leftward disc degeneration at L5-S1 and left greater than right facet hypertrophy. Mild to moderate left lateral recess stenosis and mild left foraminal stenosis at that level. Query left S1 and/or L5 radiculitis. 3. CT Abdomen and Pelvis today reported separately. Electronically Signed   By: Genevie Ann M.D.   On: 03/21/2022 05:27   CT ABDOMEN PELVIS WO CONTRAST  Result Date: 03/21/2022 CLINICAL DATA:  66 year old female with unexplained urinary retention. EXAM: CT ABDOMEN AND PELVIS WITHOUT CONTRAST TECHNIQUE: Multidetector CT imaging of the abdomen and pelvis was performed following the standard protocol without IV contrast. RADIATION DOSE REDUCTION: This exam was performed according to the departmental dose-optimization program which includes automated exposure control, adjustment of the mA and/or kV according to patient size and/or use of iterative reconstruction  technique. COMPARISON:  Lumbar spine CT today reported separately. CT Abdomen and Pelvis 01/24/2018. FINDINGS: Lower chest: Chronic pectus excavatum. Increased cardiac size since 2019, mild-to-moderate cardiomegaly now. No pericardial effusion. Possible trace pleural fluid at both lung bases. Otherwise mild lung base atelectasis. Hepatobiliary: Chronically absent gallbladder. Negative noncontrast liver. Pancreas: Negative. Spleen: Negative; occasional small chronic calcified splenic granulomas. Adrenals/Urinary Tract: Normal adrenal glands. Noncontrast kidneys appear symmetric and nonobstructed. No nephrolithiasis or pararenal inflammation. Both ureters also appear unremarkable. Streak artifact in the pelvis from right hip arthroplasty. The bladder appears decompressed by a Foley catheter. Stomach/Bowel: Retained gas and stool in the rectum. Smaller caliber upstream sigmoid colon is redundant with mild retained gas and stool. Similar retained stool in the transverse and descending colon. Similar retained stool in redundant right colon. No large bowel inflammation identified. Cecum partially located in the pelvis with streak artifact there from hip arthroplasty. Appendix not delineated. No dilated small bowel. No free air or free fluid. Stomach and duodenum appear unremarkable. Vascular/Lymphatic: Aortoiliac calcified atherosclerosis. Normal caliber abdominal aorta. No lymphadenopathy identified. Reproductive: Limited pelvic visualization due to streak artifact from right hip arthroplasty. Uterus appears to be diminutive or absent. Ovaries not identified. Other: No pelvic free fluid. Musculoskeletal: Pectus excavatum. Lumbar spine is detailed separately. Chronic right hip arthroplasty. No acute or suspicious osseous lesion identified. IMPRESSION: 1. No acute or inflammatory process identified on this noncontrast exam. Limited visualization of the pelvis due to chronic right hip arthroplasty streak artifact. The  bladder appears  to be decompressed by Foley catheter. No evidence of urinary calculus or obstructive uropathy. 2. Lumbar Spine CT is reported separately. 3. Chronic pectus excavatum with new cardiomegaly since 2019. Possible trace pleural the fusions, but otherwise mild lung base atelectasis. 4. Aortic Atherosclerosis (ICD10-I70.0). Electronically Signed   By: Genevie Ann M.D.   On: 03/21/2022 05:23     Discharge Exam: Vitals:   03/24/22 0527 03/24/22 1322  BP: 128/61 (!) 181/96  Pulse: 76 66  Resp: 18 20  Temp: 97.6 F (36.4 C) 97.8 F (36.6 C)  SpO2: 98% 100%   Vitals:   03/23/22 1327 03/23/22 2041 03/24/22 0527 03/24/22 1322  BP: 129/77 104/68 128/61 (!) 181/96  Pulse: 85 91 76 66  Resp: 16 20 18 20   Temp: (!) 97.5 F (36.4 C) 98.3 F (36.8 C) 97.6 F (36.4 C) 97.8 F (36.6 C)  TempSrc: Oral  Oral Oral  SpO2: 99% 97% 98% 100%  Weight:      Height:        General: Pt is alert, awake, not in acute distress Cardiovascular: RRR, S1/S2 +, no rubs, no gallops Respiratory: CTA bilaterally, no wheezing, no rhonchi Abdominal: Soft, NT, ND, bowel sounds + Extremities: no edema, no cyanosis    The results of significant diagnostics from this hospitalization (including imaging, microbiology, ancillary and laboratory) are listed below for reference.     Microbiology: No results found for this or any previous visit (from the past 240 hour(s)).   Labs: BNP (last 3 results) No results for input(s): "BNP" in the last 8760 hours. Basic Metabolic Panel: Recent Labs  Lab 03/20/22 1645 03/22/22 0755 03/23/22 0449 03/24/22 0617  NA 138 139 140 139  K 4.1 3.4* 3.6 3.5  CL 110 115* 118* 113*  CO2 19* 18* 17* 21*  GLUCOSE 111* 105* 69* 89  BUN 53* 25* 22 20  CREATININE 2.36* 1.36* 1.31* 1.25*  CALCIUM 7.8* 6.6* 7.0* 7.5*  MG  --  0.8* 1.9 1.6*   Liver Function Tests: No results for input(s): "AST", "ALT", "ALKPHOS", "BILITOT", "PROT", "ALBUMIN" in the last 168 hours. No  results for input(s): "LIPASE", "AMYLASE" in the last 168 hours. No results for input(s): "AMMONIA" in the last 168 hours. CBC: Recent Labs  Lab 03/20/22 1645 03/22/22 0755 03/23/22 0449 03/24/22 0617  WBC 5.3 6.7 6.9 4.5  HGB 9.0* 7.6* 7.7* 7.5*  HCT 27.0* 23.1* 23.7* 22.8*  MCV 97.1 98.7 100.9* 100.0  PLT 227 152 159 168   Cardiac Enzymes: Recent Labs  Lab 03/21/22 1634  CKTOTAL 144   BNP: Invalid input(s): "POCBNP" CBG: Recent Labs  Lab 03/23/22 1833  GLUCAP 108*   D-Dimer No results for input(s): "DDIMER" in the last 72 hours. Hgb A1c No results for input(s): "HGBA1C" in the last 72 hours. Lipid Profile No results for input(s): "CHOL", "HDL", "LDLCALC", "TRIG", "CHOLHDL", "LDLDIRECT" in the last 72 hours. Thyroid function studies Recent Labs    03/21/22 1634  TSH 0.510   Anemia work up Recent Labs    03/21/22 1634 03/23/22 0449 03/23/22 1318  VITAMINB12 504  --  822  FOLATE 11.0  --  16.2  FERRITIN  --   --  391*  TIBC  --   --  293  IRON  --   --  12*  RETICCTPCT  --  1.8  --    Urinalysis    Component Value Date/Time   COLORURINE YELLOW 03/21/2022 [place] 03/21/2022 0311  LABSPEC 1.010 03/21/2022 0311   PHURINE 5.0 03/21/2022 0311   GLUCOSEU NEGATIVE 03/21/2022 0311   HGBUR NEGATIVE 03/21/2022 0311   BILIRUBINUR NEGATIVE 03/21/2022 0311   KETONESUR NEGATIVE 03/21/2022 0311   PROTEINUR NEGATIVE 03/21/2022 0311   UROBILINOGEN 1.0 01/01/2015 1825   NITRITE NEGATIVE 03/21/2022 0311   LEUKOCYTESUR NEGATIVE 03/21/2022 0311   Sepsis Labs Recent Labs  Lab 03/20/22 1645 03/22/22 0755 03/23/22 0449 03/24/22 0617  WBC 5.3 6.7 6.9 4.5   Microbiology No results found for this or any previous visit (from the past 240 hour(s)).   Time coordinating discharge: 35 minutes  SIGNED:   Rodena Goldmann, DO [facility] 03/24/2022, 2:35 PM  If 7PM-7AM, please contact night-coverage [email or web address]

## 2022-03-24 NOTE — TOC Progression Note (Signed)
Transition of Care [facility]) - Progression Note    Patient Details  Name: Shawna [name] MRN: [id number] Date of Birth: 09-11-1955  [facility] CM/SW Contact  Boneta Lucks, RN Phone Number: 03/24/2022, 11:53 AM  Clinical Narrative:   Tonia [facility] with [facility] made a bed offer. Sister accepted. [employer] Auth started. They will admit over the weekend. TOC will follow for Auth.   Expected Discharge Plan: Skilled Nursing Facility Barriers to Discharge: No SNF bed, Insurance Authorization  Expected Discharge Plan and Services Expected Discharge Plan: [place]

## 2022-03-24 NOTE — Progress Notes (Signed)
Got patient up to [facility] twice during shift and she only had a BM each time, states she felt she had to void but could not. Bladder scan showing 322ml. Notified Dr. Clearence Ped to make aware, verbal order given for in and out cath. In and out performed by Howie Ill NT-3 assisted by this nurse. Sterile insertion maintained. 400cc's of clear yellow urine obtained. Will continue to monitor.

## 2022-03-24 NOTE — Care Management Important Message (Signed)
Important Message  Patient Details  Name: Shawna [name] MRN: [id number] Date of Birth: 1955/10/03   Medicare Important Message Given:  Yes     Tommy Medal 03/24/2022, 11:16 AM

## 2022-03-24 NOTE — Progress Notes (Signed)
Pt was bladder scanned the tech noted that it was 39ml showing. In and Out patient using sterile technique able to obtain 435ml clear yellow urine.

## 2022-03-28 DIAGNOSIS — M810 Age-related osteoporosis without current pathological fracture: Secondary | ICD-10-CM | POA: Diagnosis not present

## 2022-03-28 DIAGNOSIS — I951 Orthostatic hypotension: Secondary | ICD-10-CM | POA: Diagnosis not present

## 2022-03-28 DIAGNOSIS — N183 Chronic kidney disease, stage 3 unspecified: Secondary | ICD-10-CM | POA: Diagnosis not present

## 2022-03-28 DIAGNOSIS — D631 Anemia in chronic kidney disease: Secondary | ICD-10-CM | POA: Diagnosis not present

## 2022-03-28 DIAGNOSIS — E785 Hyperlipidemia, unspecified: Secondary | ICD-10-CM | POA: Diagnosis not present

## 2022-03-28 DIAGNOSIS — R69 Illness, unspecified: Secondary | ICD-10-CM | POA: Diagnosis not present

## 2022-03-28 DIAGNOSIS — M1990 Unspecified osteoarthritis, unspecified site: Secondary | ICD-10-CM | POA: Diagnosis not present

## 2022-03-28 DIAGNOSIS — E46 Unspecified protein-calorie malnutrition: Secondary | ICD-10-CM | POA: Diagnosis not present

## 2022-03-28 DIAGNOSIS — R5381 Other malaise: Secondary | ICD-10-CM | POA: Diagnosis not present

## 2022-03-28 DIAGNOSIS — E039 Hypothyroidism, unspecified: Secondary | ICD-10-CM | POA: Diagnosis not present

## 2022-03-31 DIAGNOSIS — E46 Unspecified protein-calorie malnutrition: Secondary | ICD-10-CM | POA: Diagnosis not present

## 2022-03-31 DIAGNOSIS — Z681 Body mass index (BMI) 19 or less, adult: Secondary | ICD-10-CM | POA: Diagnosis not present

## 2022-03-31 DIAGNOSIS — M479 Spondylosis, unspecified: Secondary | ICD-10-CM | POA: Diagnosis not present

## 2022-04-06 DIAGNOSIS — K922 Gastrointestinal hemorrhage, unspecified: Secondary | ICD-10-CM | POA: Diagnosis not present

## 2022-04-06 DIAGNOSIS — Z681 Body mass index (BMI) 19 or less, adult: Secondary | ICD-10-CM | POA: Diagnosis not present

## 2022-04-06 DIAGNOSIS — Z79899 Other long term (current) drug therapy: Secondary | ICD-10-CM | POA: Diagnosis not present

## 2022-04-06 DIAGNOSIS — E876 Hypokalemia: Secondary | ICD-10-CM | POA: Diagnosis not present

## 2022-04-06 DIAGNOSIS — R112 Nausea with vomiting, unspecified: Secondary | ICD-10-CM | POA: Diagnosis not present

## 2022-04-06 DIAGNOSIS — D62 Acute posthemorrhagic anemia: Secondary | ICD-10-CM | POA: Diagnosis not present

## 2022-04-06 DIAGNOSIS — N184 Chronic kidney disease, stage 4 (severe): Secondary | ICD-10-CM | POA: Diagnosis not present

## 2022-04-06 DIAGNOSIS — E43 Unspecified severe protein-calorie malnutrition: Secondary | ICD-10-CM | POA: Diagnosis not present

## 2022-04-06 DIAGNOSIS — R634 Abnormal weight loss: Secondary | ICD-10-CM | POA: Diagnosis not present

## 2022-04-06 DIAGNOSIS — R197 Diarrhea, unspecified: Secondary | ICD-10-CM | POA: Diagnosis not present

## 2022-04-06 DIAGNOSIS — E785 Hyperlipidemia, unspecified: Secondary | ICD-10-CM | POA: Diagnosis not present

## 2022-04-06 DIAGNOSIS — R1084 Generalized abdominal pain: Secondary | ICD-10-CM | POA: Diagnosis not present

## 2022-04-06 DIAGNOSIS — D7389 Other diseases of spleen: Secondary | ICD-10-CM | POA: Diagnosis not present

## 2022-04-06 DIAGNOSIS — R69 Illness, unspecified: Secondary | ICD-10-CM | POA: Diagnosis not present

## 2022-04-06 DIAGNOSIS — M810 Age-related osteoporosis without current pathological fracture: Secondary | ICD-10-CM | POA: Diagnosis not present

## 2022-04-06 DIAGNOSIS — N3289 Other specified disorders of bladder: Secondary | ICD-10-CM | POA: Diagnosis not present

## 2022-04-06 DIAGNOSIS — K449 Diaphragmatic hernia without obstruction or gangrene: Secondary | ICD-10-CM | POA: Diagnosis not present

## 2022-04-06 DIAGNOSIS — E871 Hypo-osmolality and hyponatremia: Secondary | ICD-10-CM | POA: Diagnosis not present

## 2022-04-06 DIAGNOSIS — K529 Noninfective gastroenteritis and colitis, unspecified: Secondary | ICD-10-CM | POA: Diagnosis not present

## 2022-04-06 DIAGNOSIS — I469 Cardiac arrest, cause unspecified: Secondary | ICD-10-CM | POA: Diagnosis not present

## 2022-04-06 DIAGNOSIS — Z9049 Acquired absence of other specified parts of digestive tract: Secondary | ICD-10-CM | POA: Diagnosis not present

## 2022-04-06 DIAGNOSIS — R55 Syncope and collapse: Secondary | ICD-10-CM | POA: Diagnosis not present

## 2022-04-06 DIAGNOSIS — I951 Orthostatic hypotension: Secondary | ICD-10-CM | POA: Diagnosis not present

## 2022-04-06 DIAGNOSIS — R627 Adult failure to thrive: Secondary | ICD-10-CM | POA: Diagnosis not present

## 2022-04-06 DIAGNOSIS — E039 Hypothyroidism, unspecified: Secondary | ICD-10-CM | POA: Diagnosis not present

## 2022-04-06 DIAGNOSIS — K5730 Diverticulosis of large intestine without perforation or abscess without bleeding: Secondary | ICD-10-CM | POA: Diagnosis not present

## 2022-04-06 DIAGNOSIS — R1110 Vomiting, unspecified: Secondary | ICD-10-CM | POA: Diagnosis not present

## 2022-04-06 DIAGNOSIS — Z743 Need for continuous supervision: Secondary | ICD-10-CM | POA: Diagnosis not present

## 2022-04-06 DIAGNOSIS — R110 Nausea: Secondary | ICD-10-CM | POA: Diagnosis not present

## 2022-04-06 DIAGNOSIS — K7689 Other specified diseases of liver: Secondary | ICD-10-CM | POA: Diagnosis not present

## 2022-04-06 DIAGNOSIS — Z79890 Hormone replacement therapy (postmenopausal): Secondary | ICD-10-CM | POA: Diagnosis not present

## 2022-04-07 DIAGNOSIS — D62 Acute posthemorrhagic anemia: Secondary | ICD-10-CM | POA: Diagnosis not present

## 2022-04-07 DIAGNOSIS — K922 Gastrointestinal hemorrhage, unspecified: Secondary | ICD-10-CM | POA: Diagnosis not present

## 2022-04-07 DIAGNOSIS — N184 Chronic kidney disease, stage 4 (severe): Secondary | ICD-10-CM | POA: Diagnosis not present

## 2022-04-07 DIAGNOSIS — I951 Orthostatic hypotension: Secondary | ICD-10-CM | POA: Diagnosis not present

## 2022-04-07 DIAGNOSIS — R634 Abnormal weight loss: Secondary | ICD-10-CM | POA: Diagnosis not present

## 2022-04-07 DIAGNOSIS — K529 Noninfective gastroenteritis and colitis, unspecified: Secondary | ICD-10-CM | POA: Diagnosis not present

## 2022-04-07 DIAGNOSIS — E039 Hypothyroidism, unspecified: Secondary | ICD-10-CM | POA: Diagnosis not present

## 2022-04-07 DIAGNOSIS — R112 Nausea with vomiting, unspecified: Secondary | ICD-10-CM | POA: Diagnosis not present

## 2022-04-07 DIAGNOSIS — R627 Adult failure to thrive: Secondary | ICD-10-CM | POA: Diagnosis not present

## 2022-04-08 DIAGNOSIS — R627 Adult failure to thrive: Secondary | ICD-10-CM | POA: Diagnosis not present

## 2022-04-08 DIAGNOSIS — K922 Gastrointestinal hemorrhage, unspecified: Secondary | ICD-10-CM | POA: Diagnosis not present

## 2022-04-08 DIAGNOSIS — R634 Abnormal weight loss: Secondary | ICD-10-CM | POA: Diagnosis not present

## 2022-04-08 DIAGNOSIS — E039 Hypothyroidism, unspecified: Secondary | ICD-10-CM | POA: Diagnosis not present

## 2022-04-08 DIAGNOSIS — N184 Chronic kidney disease, stage 4 (severe): Secondary | ICD-10-CM | POA: Diagnosis not present

## 2022-04-08 DIAGNOSIS — K529 Noninfective gastroenteritis and colitis, unspecified: Secondary | ICD-10-CM | POA: Diagnosis not present

## 2022-04-08 DIAGNOSIS — D62 Acute posthemorrhagic anemia: Secondary | ICD-10-CM | POA: Diagnosis not present

## 2022-04-08 DIAGNOSIS — I951 Orthostatic hypotension: Secondary | ICD-10-CM | POA: Diagnosis not present

## 2022-04-09 DIAGNOSIS — K529 Noninfective gastroenteritis and colitis, unspecified: Secondary | ICD-10-CM | POA: Diagnosis not present

## 2022-04-09 DIAGNOSIS — D62 Acute posthemorrhagic anemia: Secondary | ICD-10-CM | POA: Diagnosis not present

## 2022-04-09 DIAGNOSIS — I951 Orthostatic hypotension: Secondary | ICD-10-CM | POA: Diagnosis not present

## 2022-04-09 DIAGNOSIS — K922 Gastrointestinal hemorrhage, unspecified: Secondary | ICD-10-CM | POA: Diagnosis not present

## 2022-04-09 DIAGNOSIS — E039 Hypothyroidism, unspecified: Secondary | ICD-10-CM | POA: Diagnosis not present

## 2022-04-09 DIAGNOSIS — R634 Abnormal weight loss: Secondary | ICD-10-CM | POA: Diagnosis not present

## 2022-04-09 DIAGNOSIS — N184 Chronic kidney disease, stage 4 (severe): Secondary | ICD-10-CM | POA: Diagnosis not present

## 2022-04-09 DIAGNOSIS — R627 Adult failure to thrive: Secondary | ICD-10-CM | POA: Diagnosis not present

## 2022-04-10 DIAGNOSIS — E039 Hypothyroidism, unspecified: Secondary | ICD-10-CM | POA: Diagnosis not present

## 2022-04-10 DIAGNOSIS — I951 Orthostatic hypotension: Secondary | ICD-10-CM | POA: Diagnosis not present

## 2022-04-10 DIAGNOSIS — K529 Noninfective gastroenteritis and colitis, unspecified: Secondary | ICD-10-CM | POA: Diagnosis not present

## 2022-04-10 DIAGNOSIS — R634 Abnormal weight loss: Secondary | ICD-10-CM | POA: Diagnosis not present

## 2022-04-10 DIAGNOSIS — R627 Adult failure to thrive: Secondary | ICD-10-CM | POA: Diagnosis not present

## 2022-04-10 DIAGNOSIS — D62 Acute posthemorrhagic anemia: Secondary | ICD-10-CM | POA: Diagnosis not present

## 2022-04-10 DIAGNOSIS — N184 Chronic kidney disease, stage 4 (severe): Secondary | ICD-10-CM | POA: Diagnosis not present

## 2022-04-10 DIAGNOSIS — K922 Gastrointestinal hemorrhage, unspecified: Secondary | ICD-10-CM | POA: Diagnosis not present

## 2022-04-11 DIAGNOSIS — R634 Abnormal weight loss: Secondary | ICD-10-CM | POA: Diagnosis not present

## 2022-04-11 DIAGNOSIS — N184 Chronic kidney disease, stage 4 (severe): Secondary | ICD-10-CM | POA: Diagnosis not present

## 2022-04-11 DIAGNOSIS — K529 Noninfective gastroenteritis and colitis, unspecified: Secondary | ICD-10-CM | POA: Diagnosis not present

## 2022-04-11 DIAGNOSIS — E039 Hypothyroidism, unspecified: Secondary | ICD-10-CM | POA: Diagnosis not present

## 2022-04-11 DIAGNOSIS — D62 Acute posthemorrhagic anemia: Secondary | ICD-10-CM | POA: Diagnosis not present

## 2022-04-11 DIAGNOSIS — K922 Gastrointestinal hemorrhage, unspecified: Secondary | ICD-10-CM | POA: Diagnosis not present

## 2022-04-11 DIAGNOSIS — I951 Orthostatic hypotension: Secondary | ICD-10-CM | POA: Diagnosis not present

## 2022-04-11 DIAGNOSIS — R627 Adult failure to thrive: Secondary | ICD-10-CM | POA: Diagnosis not present

## 2022-04-12 DIAGNOSIS — E039 Hypothyroidism, unspecified: Secondary | ICD-10-CM | POA: Diagnosis not present

## 2022-04-12 DIAGNOSIS — R634 Abnormal weight loss: Secondary | ICD-10-CM | POA: Diagnosis not present

## 2022-04-12 DIAGNOSIS — D62 Acute posthemorrhagic anemia: Secondary | ICD-10-CM | POA: Diagnosis not present

## 2022-04-12 DIAGNOSIS — K529 Noninfective gastroenteritis and colitis, unspecified: Secondary | ICD-10-CM | POA: Diagnosis not present

## 2022-04-12 DIAGNOSIS — K922 Gastrointestinal hemorrhage, unspecified: Secondary | ICD-10-CM | POA: Diagnosis not present

## 2022-04-12 DIAGNOSIS — I951 Orthostatic hypotension: Secondary | ICD-10-CM | POA: Diagnosis not present

## 2022-04-12 DIAGNOSIS — N184 Chronic kidney disease, stage 4 (severe): Secondary | ICD-10-CM | POA: Diagnosis not present

## 2022-04-12 DIAGNOSIS — R627 Adult failure to thrive: Secondary | ICD-10-CM | POA: Diagnosis not present

## 2022-04-13 DIAGNOSIS — I951 Orthostatic hypotension: Secondary | ICD-10-CM | POA: Diagnosis not present

## 2022-04-13 DIAGNOSIS — E876 Hypokalemia: Secondary | ICD-10-CM | POA: Diagnosis not present

## 2022-04-13 DIAGNOSIS — R627 Adult failure to thrive: Secondary | ICD-10-CM | POA: Diagnosis not present

## 2022-04-13 DIAGNOSIS — D62 Acute posthemorrhagic anemia: Secondary | ICD-10-CM | POA: Diagnosis not present

## 2022-04-13 DIAGNOSIS — K529 Noninfective gastroenteritis and colitis, unspecified: Secondary | ICD-10-CM | POA: Diagnosis not present

## 2022-04-13 DIAGNOSIS — R110 Nausea: Secondary | ICD-10-CM | POA: Diagnosis not present

## 2022-04-13 DIAGNOSIS — E871 Hypo-osmolality and hyponatremia: Secondary | ICD-10-CM | POA: Diagnosis not present

## 2022-04-13 DIAGNOSIS — N184 Chronic kidney disease, stage 4 (severe): Secondary | ICD-10-CM | POA: Diagnosis not present

## 2022-04-14 DIAGNOSIS — R627 Adult failure to thrive: Secondary | ICD-10-CM | POA: Diagnosis not present

## 2022-04-14 DIAGNOSIS — E876 Hypokalemia: Secondary | ICD-10-CM | POA: Diagnosis not present

## 2022-04-14 DIAGNOSIS — D62 Acute posthemorrhagic anemia: Secondary | ICD-10-CM | POA: Diagnosis not present

## 2022-04-14 DIAGNOSIS — I951 Orthostatic hypotension: Secondary | ICD-10-CM | POA: Diagnosis not present

## 2022-04-14 DIAGNOSIS — E871 Hypo-osmolality and hyponatremia: Secondary | ICD-10-CM | POA: Diagnosis not present

## 2022-04-14 DIAGNOSIS — R110 Nausea: Secondary | ICD-10-CM | POA: Diagnosis not present

## 2022-04-14 DIAGNOSIS — N184 Chronic kidney disease, stage 4 (severe): Secondary | ICD-10-CM | POA: Diagnosis not present

## 2022-04-14 DIAGNOSIS — K529 Noninfective gastroenteritis and colitis, unspecified: Secondary | ICD-10-CM | POA: Diagnosis not present

## 2022-04-15 DIAGNOSIS — R110 Nausea: Secondary | ICD-10-CM | POA: Diagnosis not present

## 2022-04-15 DIAGNOSIS — N184 Chronic kidney disease, stage 4 (severe): Secondary | ICD-10-CM | POA: Diagnosis not present

## 2022-04-15 DIAGNOSIS — E871 Hypo-osmolality and hyponatremia: Secondary | ICD-10-CM | POA: Diagnosis not present

## 2022-04-15 DIAGNOSIS — K529 Noninfective gastroenteritis and colitis, unspecified: Secondary | ICD-10-CM | POA: Diagnosis not present

## 2022-04-15 DIAGNOSIS — D62 Acute posthemorrhagic anemia: Secondary | ICD-10-CM | POA: Diagnosis not present

## 2022-04-15 DIAGNOSIS — I951 Orthostatic hypotension: Secondary | ICD-10-CM | POA: Diagnosis not present

## 2022-04-15 DIAGNOSIS — R627 Adult failure to thrive: Secondary | ICD-10-CM | POA: Diagnosis not present

## 2022-04-15 DIAGNOSIS — E876 Hypokalemia: Secondary | ICD-10-CM | POA: Diagnosis not present

## 2022-04-16 DIAGNOSIS — I951 Orthostatic hypotension: Secondary | ICD-10-CM | POA: Diagnosis not present

## 2022-04-16 DIAGNOSIS — N184 Chronic kidney disease, stage 4 (severe): Secondary | ICD-10-CM | POA: Diagnosis not present

## 2022-04-16 DIAGNOSIS — R627 Adult failure to thrive: Secondary | ICD-10-CM | POA: Diagnosis not present

## 2022-04-16 DIAGNOSIS — E876 Hypokalemia: Secondary | ICD-10-CM | POA: Diagnosis not present

## 2022-04-16 DIAGNOSIS — R110 Nausea: Secondary | ICD-10-CM | POA: Diagnosis not present

## 2022-04-16 DIAGNOSIS — D62 Acute posthemorrhagic anemia: Secondary | ICD-10-CM | POA: Diagnosis not present

## 2022-04-16 DIAGNOSIS — E871 Hypo-osmolality and hyponatremia: Secondary | ICD-10-CM | POA: Diagnosis not present

## 2022-04-16 DIAGNOSIS — K529 Noninfective gastroenteritis and colitis, unspecified: Secondary | ICD-10-CM | POA: Diagnosis not present

## 2022-04-17 DIAGNOSIS — K529 Noninfective gastroenteritis and colitis, unspecified: Secondary | ICD-10-CM | POA: Diagnosis not present

## 2022-04-17 DIAGNOSIS — N184 Chronic kidney disease, stage 4 (severe): Secondary | ICD-10-CM | POA: Diagnosis not present

## 2022-04-17 DIAGNOSIS — E871 Hypo-osmolality and hyponatremia: Secondary | ICD-10-CM | POA: Diagnosis not present

## 2022-04-17 DIAGNOSIS — I951 Orthostatic hypotension: Secondary | ICD-10-CM | POA: Diagnosis not present

## 2022-04-17 DIAGNOSIS — E876 Hypokalemia: Secondary | ICD-10-CM | POA: Diagnosis not present

## 2022-04-17 DIAGNOSIS — R627 Adult failure to thrive: Secondary | ICD-10-CM | POA: Diagnosis not present

## 2022-04-17 DIAGNOSIS — D62 Acute posthemorrhagic anemia: Secondary | ICD-10-CM | POA: Diagnosis not present

## 2022-04-17 DIAGNOSIS — R110 Nausea: Secondary | ICD-10-CM | POA: Diagnosis not present

## 2022-04-18 DIAGNOSIS — R627 Adult failure to thrive: Secondary | ICD-10-CM | POA: Diagnosis not present

## 2022-04-18 DIAGNOSIS — K529 Noninfective gastroenteritis and colitis, unspecified: Secondary | ICD-10-CM | POA: Diagnosis not present

## 2022-04-18 DIAGNOSIS — E876 Hypokalemia: Secondary | ICD-10-CM | POA: Diagnosis not present

## 2022-04-18 DIAGNOSIS — I951 Orthostatic hypotension: Secondary | ICD-10-CM | POA: Diagnosis not present

## 2022-04-18 DIAGNOSIS — D62 Acute posthemorrhagic anemia: Secondary | ICD-10-CM | POA: Diagnosis not present

## 2022-04-18 DIAGNOSIS — N184 Chronic kidney disease, stage 4 (severe): Secondary | ICD-10-CM | POA: Diagnosis not present

## 2022-04-18 DIAGNOSIS — R110 Nausea: Secondary | ICD-10-CM | POA: Diagnosis not present

## 2022-04-18 DIAGNOSIS — E871 Hypo-osmolality and hyponatremia: Secondary | ICD-10-CM | POA: Diagnosis not present

## 2022-04-19 DIAGNOSIS — D62 Acute posthemorrhagic anemia: Secondary | ICD-10-CM | POA: Diagnosis not present

## 2022-04-19 DIAGNOSIS — K529 Noninfective gastroenteritis and colitis, unspecified: Secondary | ICD-10-CM | POA: Diagnosis not present

## 2022-04-19 DIAGNOSIS — E876 Hypokalemia: Secondary | ICD-10-CM | POA: Diagnosis not present

## 2022-04-19 DIAGNOSIS — R627 Adult failure to thrive: Secondary | ICD-10-CM | POA: Diagnosis not present

## 2022-04-19 DIAGNOSIS — E871 Hypo-osmolality and hyponatremia: Secondary | ICD-10-CM | POA: Diagnosis not present

## 2022-04-19 DIAGNOSIS — N184 Chronic kidney disease, stage 4 (severe): Secondary | ICD-10-CM | POA: Diagnosis not present

## 2022-04-19 DIAGNOSIS — R110 Nausea: Secondary | ICD-10-CM | POA: Diagnosis not present

## 2022-04-19 DIAGNOSIS — I951 Orthostatic hypotension: Secondary | ICD-10-CM | POA: Diagnosis not present

## 2022-04-20 DIAGNOSIS — I951 Orthostatic hypotension: Secondary | ICD-10-CM | POA: Diagnosis not present

## 2022-04-20 DIAGNOSIS — R110 Nausea: Secondary | ICD-10-CM | POA: Diagnosis not present

## 2022-04-20 DIAGNOSIS — K529 Noninfective gastroenteritis and colitis, unspecified: Secondary | ICD-10-CM | POA: Diagnosis not present

## 2022-04-20 DIAGNOSIS — E876 Hypokalemia: Secondary | ICD-10-CM | POA: Diagnosis not present

## 2022-04-20 DIAGNOSIS — D62 Acute posthemorrhagic anemia: Secondary | ICD-10-CM | POA: Diagnosis not present

## 2022-04-20 DIAGNOSIS — R627 Adult failure to thrive: Secondary | ICD-10-CM | POA: Diagnosis not present

## 2022-04-20 DIAGNOSIS — E871 Hypo-osmolality and hyponatremia: Secondary | ICD-10-CM | POA: Diagnosis not present

## 2022-04-20 DIAGNOSIS — N184 Chronic kidney disease, stage 4 (severe): Secondary | ICD-10-CM | POA: Diagnosis not present

## 2022-04-27 ENCOUNTER — Emergency Department (HOSPITAL_COMMUNITY): Payer: Medicare HMO

## 2022-04-27 ENCOUNTER — Other Ambulatory Visit: Payer: Self-pay

## 2022-04-27 ENCOUNTER — Encounter (HOSPITAL_COMMUNITY): Payer: Self-pay | Admitting: Emergency Medicine

## 2022-04-27 ENCOUNTER — Emergency Department (HOSPITAL_COMMUNITY)
Admission: EM | Admit: 2022-04-27 | Discharge: 2022-04-30 | Disposition: A | Discharge status: Home / Self Care | Payer: Medicare HMO | Attending: Emergency Medicine | Admitting: Emergency Medicine

## 2022-04-27 DIAGNOSIS — E876 Hypokalemia: Secondary | ICD-10-CM | POA: Diagnosis not present

## 2022-04-27 DIAGNOSIS — N189 Chronic kidney disease, unspecified: Secondary | ICD-10-CM | POA: Diagnosis not present

## 2022-04-27 DIAGNOSIS — R109 Unspecified abdominal pain: Secondary | ICD-10-CM | POA: Diagnosis not present

## 2022-04-27 DIAGNOSIS — R944 Abnormal results of kidney function studies: Secondary | ICD-10-CM | POA: Diagnosis not present

## 2022-04-27 DIAGNOSIS — G319 Degenerative disease of nervous system, unspecified: Secondary | ICD-10-CM | POA: Diagnosis not present

## 2022-04-27 DIAGNOSIS — R2689 Other abnormalities of gait and mobility: Secondary | ICD-10-CM | POA: Diagnosis not present

## 2022-04-27 DIAGNOSIS — R1084 Generalized abdominal pain: Secondary | ICD-10-CM | POA: Insufficient documentation

## 2022-04-27 DIAGNOSIS — R531 Weakness: Secondary | ICD-10-CM | POA: Diagnosis present

## 2022-04-27 DIAGNOSIS — R269 Unspecified abnormalities of gait and mobility: Secondary | ICD-10-CM | POA: Diagnosis not present

## 2022-04-27 DIAGNOSIS — D649 Anemia, unspecified: Secondary | ICD-10-CM

## 2022-04-27 DIAGNOSIS — E039 Hypothyroidism, unspecified: Secondary | ICD-10-CM | POA: Diagnosis not present

## 2022-04-27 DIAGNOSIS — R42 Dizziness and giddiness: Secondary | ICD-10-CM | POA: Diagnosis not present

## 2022-04-27 DIAGNOSIS — Z79899 Other long term (current) drug therapy: Secondary | ICD-10-CM | POA: Diagnosis not present

## 2022-04-27 DIAGNOSIS — R2681 Unsteadiness on feet: Secondary | ICD-10-CM

## 2022-04-27 DIAGNOSIS — R600 Localized edema: Secondary | ICD-10-CM | POA: Insufficient documentation

## 2022-04-27 DIAGNOSIS — R296 Repeated falls: Secondary | ICD-10-CM | POA: Diagnosis not present

## 2022-04-27 LAB — URINALYSIS, ROUTINE W REFLEX MICROSCOPIC
Bilirubin Urine: NEGATIVE
Glucose, UA: NEGATIVE mg/dL
Ketones, ur: NEGATIVE mg/dL
Leukocytes,Ua: NEGATIVE
Nitrite: NEGATIVE
Protein, ur: 100 mg/dL — AB
Specific Gravity, Urine: 1.02 (ref 1.005–1.030)
pH: 6 (ref 5.0–8.0)

## 2022-04-27 LAB — URINALYSIS, MICROSCOPIC (REFLEX)

## 2022-04-27 LAB — CBC
HCT: 22.8 % — ABNORMAL LOW (ref 36.0–46.0)
Hemoglobin: 7.2 g/dL — ABNORMAL LOW (ref 12.0–15.0)
MCH: 31.6 pg (ref 26.0–34.0)
MCHC: 31.6 g/dL (ref 30.0–36.0)
MCV: 100 fL (ref 80.0–100.0)
Platelets: 444 10*3/uL — ABNORMAL HIGH (ref 150–400)
RBC: 2.28 MIL/uL — ABNORMAL LOW (ref 3.87–5.11)
RDW: 14.1 % (ref 11.5–15.5)
WBC: 6.9 10*3/uL (ref 4.0–10.5)
nRBC: 0 % (ref 0.0–0.2)

## 2022-04-27 LAB — BASIC METABOLIC PANEL
Anion gap: 12 (ref 5–15)
BUN: 20 mg/dL (ref 8–23)
CO2: 21 mmol/L — ABNORMAL LOW (ref 22–32)
Calcium: 8.1 mg/dL — ABNORMAL LOW (ref 8.9–10.3)
Chloride: 106 mmol/L (ref 98–111)
Creatinine, Ser: 1.24 mg/dL — ABNORMAL HIGH (ref 0.44–1.00)
GFR, Estimated: 48 mL/min — ABNORMAL LOW (ref >60)
Glucose, Bld: 95 mg/dL (ref 70–99)
Potassium: 3 mmol/L — ABNORMAL LOW (ref 3.5–5.1)
Sodium: 139 mmol/L (ref 135–145)

## 2022-04-27 MED ORDER — SODIUM CHLORIDE 0.9 % IV SOLN
1000.0000 mL | INTRAVENOUS | Status: DC
  Administered 2022-04-27: 1000 mL via INTRAVENOUS

## 2022-04-27 MED ORDER — ACETAMINOPHEN 160 MG/5ML PO SOLN
650.0000 mg | Freq: Once | ORAL | Status: AC
  Administered 2022-04-27: 650 mg via ORAL
  Filled 2022-04-27: qty 20.3

## 2022-04-27 MED ORDER — ACETAMINOPHEN 325 MG PO TABS: 650.0000 mg | ORAL_TABLET | Freq: Once | ORAL | Status: DC

## 2022-04-27 MED ORDER — IOHEXOL 9 MG/ML PO SOLN
ORAL | Status: AC
  Filled 2022-04-27: qty 1000

## 2022-04-27 MED ORDER — SODIUM CHLORIDE 0.9 % IV BOLUS (SEPSIS)
500.0000 mL | Freq: Once | INTRAVENOUS | Status: AC
  Administered 2022-04-27: 500 mL via INTRAVENOUS

## 2022-04-27 NOTE — ED Triage Notes (Addendum)
Pt lives with daughter. Doesn't walk. Has been trying to walk and has fallen multiple times in last 3 days. Denies hitting head or loc/bruising. Pt a/o to most. Here with brother. States dizziness is chronic for last  weeks. Pt pale. Moving all extremities.  Family wanting placement

## 2022-04-27 NOTE — ED Notes (Signed)
Pt to CT

## 2022-04-27 NOTE — ED Notes (Signed)
Edp in room  

## 2022-04-27 NOTE — ED Provider Notes (Signed)
[facility] EMERGENCY DEPARTMENT Provider Note   CSN: [id number] Arrival date & time: 04/27/22  1622     History  Chief Complaint  Patient presents with   Dizziness   Weakness    Shawna [name] is a 66 y.o. female.   Dizziness Associated symptoms: weakness   Weakness Associated symptoms: dizziness      Patient presented to the ER for evaluation of weakness and falls.  Patient has history of hypothyroidism chronic kidney disease, hyperlipidemia, iron deficiency, gait instability, poor p.o. intake.  Has been having issues with recurrent falls.  She has been seen in the hospital for this previously patient was admitted the end of September for orthostatic hypotension.  Patient did go to a rehab facility following her discharge.  She had been started on midodrine to help with orthostatic hypotension.  Patient has had recurrent falls several times in the last few days.  She has been trying to walk and fall.  She gets lightheaded.  It is not specific when she is trying to get up or stand.  She has not noticed any trouble with her speech.  She denies any acute injuries after the fall.  Home Medications Prior to Admission medications   Medication Sig Start Date End Date Taking? Authorizing Provider  acetaminophen (TYLENOL) 325 MG tablet Take 650 mg by mouth every 6 (six) hours as needed for moderate pain. 04/20/22  Yes [provider]  calcium carbonate (TUMS - DOSED IN MG ELEMENTAL CALCIUM) 500 MG chewable tablet Chew 2 tablets by mouth daily.   Yes [provider]  fenofibrate (TRICOR) 48 MG tablet TAKE ONE (1) TABLET BY MOUTH EVERY DAY FOR CHOLESTEROL 03/31/20  Yes Purple Sage, Modena Nunnery, MD  fludrocortisone (FLORINEF) 0.1 MG tablet Take 0.1 mg by mouth daily. 04/21/22 04/21/23 Yes [provider]  levothyroxine (SYNTHROID) 50 MCG tablet TAKE ONE TABLET BY MOUTH ALONG WITH ONE-HALF TO THE 25MCG TO EQUAL 62.5MCG EVERY MORNING 11/25/20  Yes Susy Frizzle, MD  midodrine  (PROAMATINE) 10 MG tablet Take 10 mg by mouth 3 (three) times daily. 04/20/22  Yes [provider]  Multiple Vitamins-Minerals (CENTRUM SILVER) CHEW Chew 1 tablet by mouth daily.     Yes [provider]  ondansetron (ZOFRAN) 4 MG tablet Take 4 mg by mouth every 8 (eight) hours as needed for vomiting or nausea. 04/20/22  Yes [provider]  pantoprazole (PROTONIX) 40 MG tablet Take 40 mg by mouth daily. 04/21/22  Yes [provider]  senna (SENOKOT) 8.6 MG tablet Take 1 tablet by mouth daily as needed for constipation.   Yes [provider]  traMADol (ULTRAM) 50 MG tablet Take 50 mg by mouth 3 (three) times daily as needed. 04/20/22  Yes [provider]      Allergies    Bisphosphonates    Review of Systems   Review of Systems  Neurological:  Positive for dizziness and weakness.    Physical Exam Updated Vital Signs BP (!) 156/96   Pulse 65   Temp 97.9 F (36.6 C)   Resp 17   Ht 1.626 m (5\' 4" )   Wt 47 kg   SpO2 95%   BMI 17.79 kg/m  Physical Exam Vitals and nursing note reviewed.  Constitutional:      Appearance: She is ill-appearing.     Comments: Underweight  HENT:     Head: Normocephalic and atraumatic.     Right Ear: External ear normal.     Left Ear: External  ear normal.  Eyes:     General: No scleral icterus.       Right eye: No discharge.        Left eye: No discharge.     Conjunctiva/sclera: Conjunctivae normal.  Neck:     Trachea: No tracheal deviation.  Cardiovascular:     Rate and Rhythm: Normal rate and regular rhythm.  Pulmonary:     Effort: Pulmonary effort is normal. No respiratory distress.     Breath sounds: Normal breath sounds. No stridor. No wheezing or rales.  Abdominal:     General: Bowel sounds are normal. There is no distension.     Palpations: Abdomen is soft.     Tenderness: There is abdominal tenderness. There is no guarding or rebound.     Comments: Generalized tenderness   Musculoskeletal:        General: No tenderness or deformity.     Cervical back: Neck supple.     Right lower leg: Edema present.     Left lower leg: Edema present.  Skin:    General: Skin is warm and dry.     Findings: No rash.  Neurological:     General: No focal deficit present.     Mental Status: She is alert.     Cranial Nerves: No cranial nerve deficit (no facial droop, extraocular movements intact, no slurred speech).     Sensory: No sensory deficit.     Motor: Weakness and tremor present. No abnormal muscle tone or seizure activity.     Comments: Equal grip strength bilaterally, patient able to lift both legs off  Psychiatric:        Mood and Affect: Mood normal.     ED Results / Procedures / Treatments   Labs (all labs ordered are listed, but only abnormal results are displayed) Labs Reviewed  BASIC METABOLIC PANEL - Abnormal; Notable for the following components:      Result Value   Potassium 3.0 (*)    CO2 21 (*)    Creatinine, Ser 1.24 (*)    Calcium 8.1 (*)    GFR, Estimated 48 (*)    All other components within normal limits  CBC - Abnormal; Notable for the following components:   RBC 2.28 (*)    Hemoglobin 7.2 (*)    HCT 22.8 (*)    Platelets 444 (*)    All other components within normal limits  URINALYSIS, ROUTINE W REFLEX MICROSCOPIC - Abnormal; Notable for the following components:   Hgb urine dipstick TRACE (*)    Protein, ur 100 (*)    All other components within normal limits  URINALYSIS, MICROSCOPIC (REFLEX) - Abnormal; Notable for the following components:   Bacteria, UA RARE (*)    All other components within normal limits    EKG None  Radiology CT Head Wo Contrast  Result Date: 04/27/2022 CLINICAL DATA:  Neuro deficit, acute, stroke suspected. Multiple falls. EXAM: CT HEAD WITHOUT CONTRAST TECHNIQUE: Contiguous axial images were obtained from the base of the skull through the vertex without intravenous contrast. RADIATION DOSE REDUCTION:  This exam was performed according to the departmental dose-optimization program which includes automated exposure control, adjustment of the mA and/or kV according to patient size and/or use of iterative reconstruction technique. COMPARISON:  None Available. FINDINGS: Brain: Mild parenchymal volume loss is present, asymmetrically more severe involving the cerebellum. Mild periventricular white matter changes are present likely reflecting the sequela of small vessel ischemia. No evidence of acute intracranial hemorrhage or  infarct. No abnormal mass effect or midline shift. No abnormal intra or extra-axial mass lesion or fluid collection. Ventricular size is normal. Vascular: No hyperdense vessel or unexpected calcification. Skull: Normal. Negative for fracture or focal lesion. Sinuses/Orbits: No acute finding. Other: Mastoid air cells and middle ear cavities are clear IMPRESSION: 1. No acute intracranial hemorrhage or infarct. 2. Mild parenchymal volume loss, asymmetrically more severe involving the cerebellum. 3. Mild senescent change. Electronically Signed   By: Fidela Salisbury M.D.   On: 04/27/2022 23:25   CT ABDOMEN PELVIS WO CONTRAST  Result Date: 04/27/2022 CLINICAL DATA:  Abdominal pain, acute, nonlocalized. Multiple falls. EXAM: CT ABDOMEN AND PELVIS WITHOUT CONTRAST TECHNIQUE: Multidetector CT imaging of the abdomen and pelvis was performed following the standard protocol without IV contrast. RADIATION DOSE REDUCTION: This exam was performed according to the departmental dose-optimization program which includes automated exposure control, adjustment of the mA and/or kV according to patient size and/or use of iterative reconstruction technique. COMPARISON:  03/21/2022 FINDINGS: Lower chest: Pectus deformity. Mild cardiomegaly is present. At least mild right coronary artery calcification. Hypoattenuation of the cardiac blood pool in keeping with moderate anemia. Small right and trace left pleural effusions  have developed. Hepatobiliary: Subtle nodularity of the hepatic contour in keeping with changes of underlying cirrhosis. No intrahepatic mass identified. Cholecystectomy has been performed. Pancreas: Unremarkable Spleen: Unremarkable Adrenals/Urinary Tract: The adrenal glands are unremarkable. The kidneys are normal on this noncontrast examination. The bladder is moderately distended but is otherwise unremarkable. Stomach/Bowel: The stomach, small bowel, and large bowel are unremarkable. The appendix is not clearly identified and may be absent. No free intraperitoneal gas or fluid. Vascular/Lymphatic: Aortic atherosclerosis. No enlarged abdominal or pelvic lymph nodes. Reproductive: Uterus and bilateral adnexa are unremarkable. Other: No abdominal wall hernia or abnormality. No abdominopelvic ascites. Musculoskeletal: Surgical changes of right total hip arthroplasty are identified. 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 No superimposed acute fracture or dislocation. Mildly dysplastic appearance of the left hip with superimposed moderate degenerative arthritis with joint space narrowing and subchondral sclerosis. No acute bone abnormality. IMPRESSION: 1. No acute intra-abdominal pathology identified. No definite radiographic explanation for the patient's reported symptoms. 2. Morphologic changes in keeping with underlying cirrhosis. No intrahepatic mass identified on this limited exam. 3. Mild cardiomegaly. Mild right coronary artery calcification. 4. New small right and trace left pleural effusions. 5. Status post right total hip arthroplasty. 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 6. Moderate left hip degenerative arthritis. 7. Aortic  atherosclerosis. Aortic Atherosclerosis (ICD10-I70.0). Electronically Signed   By: Fidela Salisbury M.D.   On: 04/27/2022 23:20    Procedures Procedures    Medications Ordered in ED Medications  sodium chloride 0.9 % bolus 500 mL (500 mLs Intravenous New Bag/Given 04/27/22 2109)    Followed by  0.9 %  sodium chloride infusion (1,000 mLs Intravenous New Bag/Given 04/27/22 2109)  iohexol (OMNIPAQUE) 9 MG/ML oral solution (has no administration in time range)  potassium chloride SA (KLOR-CON M) CR tablet 40 mEq (has no administration in time range)  acetaminophen (TYLENOL) 160 MG/5ML solution 650 mg (650 mg Oral Given 04/27/22 2156)    ED Course/ Medical Decision Making/ A&P Clinical Course as of 04/28/22 0016  Thu Apr 27, 2022  1947 CBC(!) Hemoglobin  similar compared to previous values [JK]  2956 Basic metabolic panel(!) Potassium decreased at 3.0 [JK]  2130 Basic metabolic panel(!) Creatinine elevated but similar to previous values [JK]  2146 MRIs from September of this year reviewed.  Atrophy noted.  No acute stroke [JK]  2232 Urinalysis, Routine w reflex microscopic Urine, Clean Catch(!) Urinalysis without signs of infection [JK]  2340 CT ABDOMEN PELVIS WO CONTRAST Abdomen pelvis CT without acute findings [JK]    Clinical Course User Index [JK] Dorie Rank, MD                           Medical Decision Making Problems Addressed: Anemia, unspecified type: chronic illness or injury Gait instability: chronic illness or injury Hypokalemia: acute illness or injury  Amount and/or Complexity of Data Reviewed Labs: ordered. Decision-making details documented in ED Course. Radiology: ordered and independent interpretation performed. Decision-making details documented in ED Course.  Risk OTC drugs. Prescription drug management.   Patient presented to ED for evaluation of recurrent falls.  Patient has been in the hospital recently for similar symptoms.  Patient has been in a nursing  facility.  Family is concerned about her safety at home.  No acute findings of infection.  No injuries noted.  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  Patient has chronic anemia but no signs of active bleeding.  She has multiple comorbidities but I do not see any acute process today that require hospitalization.  I do think she would benefit from assisted versus skilled nursing facility with her gait instability.  Family will pick patient up in the morning.  I will place a TOC consult asked them to assist family with her nursing facility placement        Final Clinical Impression(s) / ED Diagnoses Final diagnoses:  Gait instability  Hypokalemia  Anemia, unspecified type    Rx / DC Orders ED Discharge Orders     None         Dorie Rank, MD 04/28/22 [phone]

## 2022-04-27 NOTE — ED Notes (Signed)
Called sister, Benjamine Mola, regarding pt needing to be picked up because EDP is going to place her up for d/c. Sister states she cannot pick her up tonight because she takes care of her 65 year old grandson and cannot come out. She states she will get here in the morning.  She also said that she is working on finding patient placement and would also like our Education officer, museum to call her to assist in finding her somewhere.

## 2022-04-28 MED ORDER — ACETAMINOPHEN 160 MG/5ML PO SOLN
650.0000 mg | Freq: Once | ORAL | Status: AC
  Administered 2022-04-28: 650 mg via ORAL
  Filled 2022-04-28 (×2): qty 20.3

## 2022-04-28 MED ORDER — ACETAMINOPHEN 325 MG PO TABS
650.0000 mg | ORAL_TABLET | Freq: Once | ORAL | Status: AC
  Administered 2022-04-28: 650 mg via ORAL
  Filled 2022-04-28: qty 2

## 2022-04-28 MED ORDER — POTASSIUM CHLORIDE CRYS ER 20 MEQ PO TBCR
40.0000 meq | EXTENDED_RELEASE_TABLET | Freq: Once | ORAL | Status: AC
  Administered 2022-04-28: 40 meq via ORAL
  Filled 2022-04-28: qty 2

## 2022-04-28 MED ORDER — MELATONIN 3 MG PO TABS
3.0000 mg | ORAL_TABLET | Freq: Every day | ORAL | Status: DC
  Administered 2022-04-28 – 2022-04-29 (×2): 3 mg via ORAL
  Filled 2022-04-28 (×2): qty 1

## 2022-04-28 MED ORDER — LOPERAMIDE HCL 2 MG PO CAPS
2.0000 mg | ORAL_CAPSULE | Freq: Once | ORAL | Status: AC
  Administered 2022-04-28: 2 mg via ORAL
  Filled 2022-04-28: qty 1

## 2022-04-28 NOTE — ED Notes (Signed)
Sisiter   cell# [phone]

## 2022-04-28 NOTE — ED Notes (Signed)
Pt alert and oriented x 4. Pt in good spirits, pt aware of plan of care, pt requesting some jello- pt given jello and beverage.

## 2022-04-28 NOTE — ED Notes (Signed)
Pt remains in bed with eyes closed. Breathing even and unlabored. Purewick still in place

## 2022-04-28 NOTE — ED Notes (Signed)
Pt incontinent of stool. RN performed bedside cleaning using incontinence spray and placed pt in a clean brief. Replaced purewick and assisted pt with repositioning in stretcher. No additional needs or concerns at this time.

## 2022-04-28 NOTE — NC FL2 (Signed)
[place] MEDICAID FL2 LEVEL OF CARE SCREENING TOOL     IDENTIFICATION  Patient Name: Shawna [name] Birthdate: 06/17/1956 Sex: female Admission Date (Current Location): 04/27/2022  [facility] and [place] Number:  [employer] and Address:  [place] [address], [place]      Provider Number: [id number]  Attending Physician Name and Address:  Default, Provider, MD  Relative Name and Phone Number:       Current Level of Care: Hospital Recommended Level of Care: [place] Prior Approval Number:    Date Approved/Denied:   PASRR Number: [id number]  Discharge Plan: SNF    Current Diagnoses: Patient Active Problem List   Diagnosis Date Noted   Orthostatic hypotension 03/21/2022   Failure to thrive in adult 73/71/0626   Metabolic acidosis 94/85/4627   Chronic kidney disease (CKD), stage IV (severe) (HCC) 09/03/2015   Anemia 06/14/2015   Loss of weight 08/26/2013   Hyperlipidemia 02/08/2012   Osteoporosis 08/10/2011   Chronic pain 01/27/2011   Intellectual disability 01/27/2011   Hypothyroid 01/26/2011    Orientation RESPIRATION BLADDER Height & Weight     Self, Time, Situation, Place  Normal Continent, External catheter Weight: 103 lb 9.9 oz (47 kg) Height:  5\' 4"  (162.6 cm)  BEHAVIORAL SYMPTOMS/MOOD NEUROLOGICAL BOWEL NUTRITION STATUS      Continent Diet (Regular)  AMBULATORY STATUS COMMUNICATION OF NEEDS Skin   Extensive Assist Verbally Normal                       Personal Care Assistance Level of Assistance  Bathing, Feeding, Dressing Bathing Assistance: Maximum assistance Feeding assistance: Independent Dressing Assistance: Maximum assistance     Functional Limitations Info  Sight, Hearing, Speech Sight Info: Adequate Hearing Info: Adequate Speech Info: Adequate    SPECIAL CARE FACTORS FREQUENCY  PT (By licensed PT), OT (By licensed OT)     PT Frequency: 5 times weekly OT Frequency: 5 times  weekly            Contractures Contractures Info: Not present    Additional Factors Info  Code Status, Allergies Code Status Info: FULL Allergies Info: Bisphosphonates           Current Medications (04/28/2022):  This is the current hospital active medication list Current Facility-Administered Medications  Medication Dose Route Frequency Provider Last Rate Last Admin   0.9 %  sodium chloride infusion  1,000 mL Intravenous Continuous Dorie Rank, MD   Stopped at 04/28/22 [phone]   Current Outpatient Medications  Medication Sig Dispense Refill   acetaminophen (TYLENOL) 325 MG tablet Take 650 mg by mouth every 6 (six) hours as needed for moderate pain.     calcium carbonate (TUMS - DOSED IN MG ELEMENTAL CALCIUM) 500 MG chewable tablet Chew 2 tablets by mouth daily.     fenofibrate (TRICOR) 48 MG tablet TAKE ONE (1) TABLET BY MOUTH EVERY DAY FOR CHOLESTEROL 90 tablet 2   fludrocortisone (FLORINEF) 0.1 MG tablet Take 0.1 mg by mouth daily.     levothyroxine (SYNTHROID) 50 MCG tablet TAKE ONE TABLET BY MOUTH ALONG WITH ONE-HALF TO THE 25MCG TO EQUAL 62.5MCG EVERY MORNING 90 tablet 0   midodrine (PROAMATINE) 10 MG tablet Take 10 mg by mouth 3 (three) times daily.     Multiple Vitamins-Minerals (CENTRUM SILVER) CHEW Chew 1 tablet by mouth daily.       ondansetron (ZOFRAN) 4 MG tablet Take 4 mg by mouth every 8 (eight) hours  as needed for vomiting or nausea.     pantoprazole (PROTONIX) 40 MG tablet Take 40 mg by mouth daily.     senna (SENOKOT) 8.6 MG tablet Take 1 tablet by mouth daily as needed for constipation.     traMADol (ULTRAM) 50 MG tablet Take 50 mg by mouth 3 (three) times daily as needed.       Discharge Medications: Please see discharge summary for a list of discharge medications.  Relevant Imaging Results:  Relevant Lab Results:   Additional Information SSN [id number], [place]

## 2022-04-28 NOTE — Plan of Care (Signed)
  Problem: Acute Rehab PT Goals(only PT should resolve) Goal: Pt Will Go Supine/Side To Sit Outcome: Progressing Flowsheets (Taken 04/28/2022 1123) Pt will go Supine/Side to Sit: Independently Goal: Patient Will Transfer Sit To/From Stand Outcome: Progressing Flowsheets (Taken 04/28/2022 1123) Patient will transfer sit to/from stand: with modified independence Goal: Pt Will Transfer Bed To Chair/Chair To Bed Outcome: Progressing Flowsheets (Taken 04/28/2022 1123) Pt will Transfer Bed to Chair/Chair to Bed: with supervision Goal: Pt Will Ambulate Outcome: Progressing Flowsheets (Taken 04/28/2022 1123) Pt will Ambulate:  25 feet  with rolling walker  with min guard assist   [employer], SPT

## 2022-04-28 NOTE — ED Provider Notes (Signed)
Informed by nursing that patient is having frequent diarrhea.  C. difficile and Imodium ordered for the patient.   Fransico Meadow, MD 04/28/22 [phone]

## 2022-04-28 NOTE — ED Notes (Signed)
Pt given saltines per request

## 2022-04-28 NOTE — ED Notes (Signed)
Pt incontinent of stool. MD notified and antidiarrheal requested. Awaiting orders and will continue to monitor.

## 2022-04-28 NOTE — ED Notes (Signed)
CSW spoke to Susie with [facility] who states they have a bed and will be able to accept pt. She asked to be updated once there is an answer on insurance auth. CSW updated pts sister of this and she is accepting of bed offer at this facility as this is their first choice. CSW faxed clinicals to Susie with [facility]. Insurance auth to be started. TOC to follow.

## 2022-04-28 NOTE — ED Notes (Signed)
TOC consulted for HH/DME/SNF placement. CSW spoke with pts sister Ms. Pugh who states that she cannot care for pt. She has been calling facilities from the community to try and get pt placed. Ms. Delma Freeze states that pt has 7 SNF days left. She understands that family would be able to private pay if insurance is denied. CSW requested that MD place PT order. TOC to follow.

## 2022-04-28 NOTE — ED Notes (Signed)
PT currently at bedside with pt for eval

## 2022-04-28 NOTE — Evaluation (Addendum)
Physical Therapy Evaluation Patient Details Name: Shawna [name] MRN: [id number] DOB: 31-Jul-1955 Today's Date: 04/28/2022  History of Present Illness  Patient presented to the ER for evaluation of weakness and falls.  Patient has history of hypothyroidism chronic kidney disease, hyperlipidemia, iron deficiency, gait instability, poor p.o. intake.  Has been having issues with recurrent falls.  She has been seen in the hospital for this previously patient was admitted the end of September for orthostatic hypotension.  Patient did go to a rehab facility following her discharge.  She had been started on midodrine to help with orthostatic hypotension.  Patient has had recurrent falls several times in the last few days.  She has been trying to walk and fall.  She gets lightheaded.  It is not specific when she is trying to get up or stand.  She has not noticed any trouble with her speech.  She denies any acute injuries after the fall.   Clinical Impression  Patient demonstrates slightly labored movement for sitting up at bedside. Patient able to take a few unsteady steps to the side, forward and backward in room with RW before having to sit due to c/o dizziness and back pain. Patient requested to be put back to bed - nurse notified. Patient will benefit from continued skilled physical therapy in hospital and recommended venue below to increase strength, balance, endurance for safe ADLs and gait.     Recommendations for follow up therapy are one component of a multi-disciplinary discharge planning process, led by the attending physician.  Recommendations may be updated based on patient status, additional functional criteria and insurance authorization.  Follow Up Recommendations Skilled nursing-short term rehab (<3 hours/day) Can patient physically be transported by private vehicle: Yes    Assistance Recommended at Discharge Set up Supervision/Assistance  Patient can return home with the following  A lot of  help with walking and/or transfers;A lot of help with bathing/dressing/bathroom;Help with stairs or ramp for entrance;Assistance with cooking/housework    Equipment Recommendations None recommended by PT  Recommendations for Other Services       Functional Status Assessment Patient has had a recent decline in their functional status and demonstrates the ability to make significant improvements in function in a reasonable and predictable amount of time.     Precautions / Restrictions Precautions Precautions: Fall Restrictions Weight Bearing Restrictions: No      Mobility  Bed Mobility Overal bed mobility: Modified Independent             General bed mobility comments: patient demonstrates labored movement for bed mobility, increased time    Transfers                        Ambulation/Gait Ambulation/Gait assistance: Mod assist, Min assist Gait Distance (Feet): 6 Feet Assistive device: Rolling walker (2 wheels) Gait Pattern/deviations: Decreased step length - left, Decreased step length - right, Decreased stride length Gait velocity: decreased     General Gait Details: patient demonstrates unsteadiness and shakiness on feet with fair use of RW requiring min/mod assist for balance and safety, able to complete a few steps to the side, forward and backwards at bedside, limitied mostly due to c/o dizziness and back pain  Stairs            Wheelchair Mobility    Modified Rankin (Stroke Patients Only)       Balance Overall balance assessment: Needs assistance Sitting-balance support: Feet supported, Bilateral upper extremity supported Sitting balance-Leahy Scale:  Good Sitting balance - Comments: seated EOB   Standing balance support: Bilateral upper extremity supported, Reliant on assistive device for balance, During functional activity Standing balance-Leahy Scale: Poor Standing balance comment: poor/fair with RW                              Pertinent Vitals/Pain Pain Assessment Pain Assessment: Faces Faces Pain Scale: Hurts little more Pain Location: back, shoulder Pain Descriptors / Indicators: Grimacing, Guarding, Aching Pain Intervention(s): Limited activity within patient's tolerance, Monitored during session, Repositioned, Patient requesting pain meds-RN notified    Home Living Family/patient expects to be discharged to:: Private residence Living Arrangements: Other relatives Available Help at Discharge: Family;Available PRN/intermittently Type of Home: House Home Access: Stairs to enter Entrance Stairs-Rails: Right Entrance Stairs-Number of Steps: 3 Alternate Level Stairs-Number of Steps: 13 Home Layout: Two level;Bed/bath upstairs Home Equipment: Cane - single point      Prior Function Prior Level of Function : Independent/Modified Independent             Mobility Comments: Reports modified independent at baseline, recently started using RW, has had several falls w/ RW in past few days ADLs Comments: Independent     Hand Dominance   Dominant Hand: Right    Extremity/Trunk Assessment   Upper Extremity Assessment Upper Extremity Assessment: Generalized weakness    Lower Extremity Assessment Lower Extremity Assessment: Generalized weakness    Cervical / Trunk Assessment Cervical / Trunk Assessment: Normal  Communication   Communication: No difficulties  Cognition Arousal/Alertness: Awake/alert Behavior During Therapy: WFL for tasks assessed/performed Overall Cognitive Status: Within Functional Limits for tasks assessed                                          General Comments      Exercises     Assessment/Plan    PT Assessment Patient needs continued PT services  PT Problem List Decreased strength;Decreased activity tolerance;Decreased balance;Decreased mobility       PT Treatment Interventions DME instruction;Gait training;Stair training;Functional  mobility training;Therapeutic activities;Therapeutic exercise;Balance training;Patient/family education    PT Goals (Current goals can be found in the Care Plan section)  Acute Rehab PT Goals Patient Stated Goal: return home after rehab PT Goal Formulation: With patient Time For Goal Achievement: 05/12/22 Potential to Achieve Goals: Good    Frequency Min 3X/week     Co-evaluation               AM-PAC PT "6 Clicks" Mobility  Outcome Measure Help needed turning from your back to your side while in a flat bed without using bedrails?: None Help needed moving from lying on your back to sitting on the side of a flat bed without using bedrails?: A Little Help needed moving to and from a bed to a chair (including a wheelchair)?: A Lot Help needed standing up from a chair using your arms (e.g., wheelchair or bedside chair)?: A Little Help needed to walk in hospital room?: A Lot Help needed climbing 3-5 steps with a railing? : A Lot 6 Click Score: 16    End of Session Equipment Utilized During Treatment: Gait belt Activity Tolerance: Patient tolerated treatment well;Patient limited by fatigue Patient left: in bed;with call bell/phone within reach Nurse Communication: Mobility status;Patient requests pain meds PT Visit Diagnosis: Unsteadiness on feet (R26.81);Other abnormalities of gait and  mobility (R26.89);Muscle weakness (generalized) (M62.81)    Time: 1040-1101 PT Time Calculation (min) (ACUTE ONLY): 21 min   Charges:   PT Evaluation $PT Eval Moderate Complexity: 1 Mod PT Treatments $Therapeutic Activity: 8-22 mins        Zigmund Gottron, SPT

## 2022-04-28 NOTE — ED Notes (Signed)
Checked pt brief per pt request- brief is dry, pt has not had BM, purewick applied. Pt tolerated well. Pt eating jello at this time, requesting tylenol for HA and something to help her sleep-Dr Philip Aspen made aware.

## 2022-04-28 NOTE — ED Notes (Signed)
PT eval complete and pt is requesting tylenol. MD notified and RN will await orders.

## 2022-04-29 LAB — BASIC METABOLIC PANEL
Anion gap: 8 (ref 5–15)
BUN: 15 mg/dL (ref 8–23)
CO2: 24 mmol/L (ref 22–32)
Calcium: 7.7 mg/dL — ABNORMAL LOW (ref 8.9–10.3)
Chloride: 110 mmol/L (ref 98–111)
Creatinine, Ser: 1.14 mg/dL — ABNORMAL HIGH (ref 0.44–1.00)
GFR, Estimated: 53 mL/min — ABNORMAL LOW (ref >60)
Glucose, Bld: 103 mg/dL — ABNORMAL HIGH (ref 70–99)
Potassium: 3.4 mmol/L — ABNORMAL LOW (ref 3.5–5.1)
Sodium: 142 mmol/L (ref 135–145)

## 2022-04-29 LAB — C DIFFICILE QUICK SCREEN W PCR REFLEX
C Diff antigen: NEGATIVE
C Diff interpretation: NOT DETECTED
C Diff toxin: NEGATIVE

## 2022-04-29 LAB — MAGNESIUM: Magnesium: 1.2 mg/dL — ABNORMAL LOW (ref 1.7–2.4)

## 2022-04-29 MED ORDER — ACETAMINOPHEN 325 MG PO TABS
650.0000 mg | ORAL_TABLET | Freq: Four times a day (QID) | ORAL | Status: DC | PRN
  Administered 2022-04-29 – 2022-04-30 (×3): 650 mg via ORAL
  Filled 2022-04-29 (×3): qty 2

## 2022-04-29 MED ORDER — POTASSIUM CHLORIDE CRYS ER 20 MEQ PO TBCR
40.0000 meq | EXTENDED_RELEASE_TABLET | Freq: Once | ORAL | Status: AC
  Administered 2022-04-29: 40 meq via ORAL
  Filled 2022-04-29: qty 2

## 2022-04-29 MED ORDER — MAGNESIUM OXIDE -MG SUPPLEMENT 400 (240 MG) MG PO TABS
800.0000 mg | ORAL_TABLET | Freq: Every day | ORAL | Status: DC
  Administered 2022-04-29 – 2022-04-30 (×2): 800 mg via ORAL
  Filled 2022-04-29 (×2): qty 2

## 2022-04-29 NOTE — ED Provider Notes (Signed)
Emergency Medicine Observation Re-evaluation Note  Shawna [name] is a 66 y.o. female, seen on rounds today.  Pt initially presented to the ED for complaints of Dizziness and Weakness Currently, the patient is awake and alert.  Pt came to the ED for frequent falls.  She is not safe for home.  TOC attempting to place.  Pt did have trouble with diarrhea, but has not had any since imodium.  C.diff yest neg.  Pt was mildly hypokalemic on 11/2.  No labs yesterday.  Physical Exam  BP (!) 148/92   Pulse (!) 104   Temp 98.1 F (36.7 C) (Oral)   Resp 18   Ht 5\' 4"  (1.626 m)   Wt 47 kg   SpO2 93%   BMI 17.79 kg/m  Physical Exam General: awake and alert Cardiac: rr Lungs: clear Psych: calm  ED Course / MDM  EKG:EKG Interpretation  Date/Time:  Thursday April 27 2022 18:50:55 EDT Ventricular Rate:  93 PR Interval:  161 QRS Duration: 111 QT Interval:  435 QTC Calculation: 542 R Axis:   42 Text Interpretation: Sinus rhythm Nonspecific T abnrm, anterolateral leads Prolonged QT interval Artifact in lead(s) I III aVR aVL aVF When compared with ECG of 03/20/2022, QT has lengthened Confirmed by Delora Fuel ([id number]) on 04/28/2022 3:43:40 AM  I have reviewed the labs performed to date as well as medications administered while in observation.  Recent changes in the last 24 hours include SW/CM working on placement.  Plan  Current plan is for SNF placement.  Pt was mildly hypokalemic on 11/2 which was before she had diarrhea.  I will repeat a bmp and mg this am and replace if needed.    Shawna Pence, MD 04/29/22 [phone]

## 2022-04-29 NOTE — ED Notes (Signed)
TOC following. SNF insurance authorization is still pending. Spoke with pt's sister and with SNF supervisor this AM to review dc plan. At this time, plan is for transfer to SNF once insurance decision made. If insurance denies, pt/family will private pay. Pt cannot admit to SNF until authorization determination process is complete.   Covering TOC will continue to follow.

## 2022-04-29 NOTE — ED Notes (Signed)
Pt was given a late breakfast

## 2022-04-29 NOTE — ED Notes (Signed)
Spoke with [place], [place] [phone]) informed nurse patient will be transition of care to SNF. Insurance approval pending.

## 2022-04-30 DIAGNOSIS — M479 Spondylosis, unspecified: Secondary | ICD-10-CM | POA: Diagnosis not present

## 2022-04-30 DIAGNOSIS — R600 Localized edema: Secondary | ICD-10-CM | POA: Diagnosis not present

## 2022-04-30 DIAGNOSIS — R627 Adult failure to thrive: Secondary | ICD-10-CM | POA: Diagnosis not present

## 2022-04-30 DIAGNOSIS — E46 Unspecified protein-calorie malnutrition: Secondary | ICD-10-CM | POA: Diagnosis not present

## 2022-04-30 DIAGNOSIS — E785 Hyperlipidemia, unspecified: Secondary | ICD-10-CM | POA: Diagnosis not present

## 2022-04-30 DIAGNOSIS — I951 Orthostatic hypotension: Secondary | ICD-10-CM | POA: Diagnosis not present

## 2022-04-30 DIAGNOSIS — R1084 Generalized abdominal pain: Secondary | ICD-10-CM | POA: Diagnosis not present

## 2022-04-30 DIAGNOSIS — M810 Age-related osteoporosis without current pathological fracture: Secondary | ICD-10-CM | POA: Diagnosis not present

## 2022-04-30 DIAGNOSIS — N189 Chronic kidney disease, unspecified: Secondary | ICD-10-CM | POA: Diagnosis not present

## 2022-04-30 DIAGNOSIS — M1990 Unspecified osteoarthritis, unspecified site: Secondary | ICD-10-CM | POA: Diagnosis not present

## 2022-04-30 DIAGNOSIS — R69 Illness, unspecified: Secondary | ICD-10-CM | POA: Diagnosis not present

## 2022-04-30 DIAGNOSIS — E039 Hypothyroidism, unspecified: Secondary | ICD-10-CM | POA: Diagnosis not present

## 2022-04-30 DIAGNOSIS — D631 Anemia in chronic kidney disease: Secondary | ICD-10-CM | POA: Diagnosis not present

## 2022-04-30 DIAGNOSIS — Z681 Body mass index (BMI) 19 or less, adult: Secondary | ICD-10-CM | POA: Diagnosis not present

## 2022-04-30 DIAGNOSIS — M859 Disorder of bone density and structure, unspecified: Secondary | ICD-10-CM | POA: Diagnosis not present

## 2022-04-30 DIAGNOSIS — N183 Chronic kidney disease, stage 3 unspecified: Secondary | ICD-10-CM | POA: Diagnosis not present

## 2022-04-30 DIAGNOSIS — I700 Atherosclerosis of aorta: Secondary | ICD-10-CM | POA: Diagnosis not present

## 2022-04-30 DIAGNOSIS — F79 Unspecified intellectual disabilities: Secondary | ICD-10-CM | POA: Diagnosis not present

## 2022-04-30 DIAGNOSIS — D649 Anemia, unspecified: Secondary | ICD-10-CM | POA: Diagnosis not present

## 2022-04-30 DIAGNOSIS — R531 Weakness: Secondary | ICD-10-CM | POA: Diagnosis not present

## 2022-04-30 DIAGNOSIS — G8929 Other chronic pain: Secondary | ICD-10-CM | POA: Diagnosis not present

## 2022-04-30 DIAGNOSIS — J189 Pneumonia, unspecified organism: Secondary | ICD-10-CM | POA: Diagnosis not present

## 2022-04-30 DIAGNOSIS — R2689 Other abnormalities of gait and mobility: Secondary | ICD-10-CM | POA: Diagnosis not present

## 2022-04-30 DIAGNOSIS — Z79899 Other long term (current) drug therapy: Secondary | ICD-10-CM | POA: Diagnosis not present

## 2022-04-30 DIAGNOSIS — R944 Abnormal results of kidney function studies: Secondary | ICD-10-CM | POA: Diagnosis not present

## 2022-04-30 DIAGNOSIS — E876 Hypokalemia: Secondary | ICD-10-CM | POA: Diagnosis not present

## 2022-04-30 MED ORDER — MAG-OXIDE 200 MG PO TABS: 200.0000 mg | ORAL_TABLET | Freq: Every day | ORAL | 0 refills | Status: AC

## 2022-04-30 MED ORDER — POTASSIUM CHLORIDE CRYS ER 20 MEQ PO TBCR: 20.0000 meq | EXTENDED_RELEASE_TABLET | Freq: Two times a day (BID) | ORAL | 0 refills | Status: AC

## 2022-04-30 MED ORDER — POTASSIUM CHLORIDE CRYS ER 20 MEQ PO TBCR: 20.0000 meq | EXTENDED_RELEASE_TABLET | Freq: Two times a day (BID) | ORAL | 0 refills | Status: DC

## 2022-04-30 NOTE — ED Notes (Signed)
4th attempt to call report to [facility].

## 2022-04-30 NOTE — ED Provider Notes (Addendum)
I gave him Dilaudid now he is saying that started Physical Exam  BP (!) 155/90   Pulse 75   Temp 97.9 F (36.6 C) (Oral)   Resp 18   Ht 5\' 4"  (1.626 m)   Wt 47 kg   SpO2 97%   BMI 17.79 kg/m   Physical Exam  Procedures  Procedures  ED Course / MDM   Clinical Course as of 04/30/22 0715  Thu Apr 27, 2022  1947 CBC(!) Hemoglobin similar compared to previous values [JK]  4010 Basic metabolic panel(!) Potassium decreased at 3.0 [JK]  2725 Basic metabolic panel(!) Creatinine elevated but similar to previous values [JK]  2146 MRIs from September of this year reviewed.  Atrophy noted.  No acute stroke [JK]  2232 Urinalysis, Routine w reflex microscopic Urine, Clean Catch(!) Urinalysis without signs of infection [JK]  2340 CT ABDOMEN PELVIS WO CONTRAST Abdomen pelvis CT without acute findings [JK]    Clinical Course User Index [JK] Dorie Rank, MD   Medical Decision Making Amount and/or Complexity of Data Reviewed Labs: ordered. Decision-making details documented in ED Course. Radiology: ordered. Decision-making details documented in ED Course.  Risk OTC drugs. Prescription drug management.   Still pending nursing home placement.  Mild hypokalemia and hypomagnesemia.  Had had some oral supplementation.  It appears that insurance issues are somewhat delaying transfer.  Finding if insurance will pay and if not then will be private pay.   accepted at nursing home. reviewed medicines.  Restarted on medicines from before.  Also added magnesium and potassium since she was low.      Davonna Belling, MD 04/30/22 [id number]    Davonna Belling, MD 04/30/22 1335

## 2022-04-30 NOTE — ED Notes (Signed)
Attempted report to [facility] x 2 and nurse never answers phone.

## 2022-04-30 NOTE — ED Notes (Signed)
3rd attempt no one answered the phone and vm was left.

## 2022-04-30 NOTE — ED Notes (Signed)
Lunch tray given. 

## 2022-04-30 NOTE — ED Notes (Signed)
CSW updated that pts Shawna [name] has been approved for SNF at this time. CSW updated family. Pts brother will pick her up and transport to facility. CSW updated facility. AVS sent to weekend fax number at the facility. CSW updated MD and RN. RN to call report. TOC signing off.

## 2022-04-30 NOTE — ED Notes (Signed)
Breakfast tray given. °

## 2022-04-30 NOTE — ED Notes (Signed)
Pt needs medication crushed d/t she feels she would choke on them.

## 2022-05-02 DIAGNOSIS — E46 Unspecified protein-calorie malnutrition: Secondary | ICD-10-CM | POA: Diagnosis not present

## 2022-05-02 DIAGNOSIS — D631 Anemia in chronic kidney disease: Secondary | ICD-10-CM | POA: Diagnosis not present

## 2022-05-02 DIAGNOSIS — E039 Hypothyroidism, unspecified: Secondary | ICD-10-CM | POA: Diagnosis not present

## 2022-05-02 DIAGNOSIS — N183 Chronic kidney disease, stage 3 unspecified: Secondary | ICD-10-CM | POA: Diagnosis not present

## 2022-05-02 DIAGNOSIS — M810 Age-related osteoporosis without current pathological fracture: Secondary | ICD-10-CM | POA: Diagnosis not present

## 2022-05-02 DIAGNOSIS — I951 Orthostatic hypotension: Secondary | ICD-10-CM | POA: Diagnosis not present

## 2022-05-02 DIAGNOSIS — Z681 Body mass index (BMI) 19 or less, adult: Secondary | ICD-10-CM | POA: Diagnosis not present

## 2022-05-02 DIAGNOSIS — M479 Spondylosis, unspecified: Secondary | ICD-10-CM | POA: Diagnosis not present

## 2022-05-02 DIAGNOSIS — R69 Illness, unspecified: Secondary | ICD-10-CM | POA: Diagnosis not present

## 2022-05-02 DIAGNOSIS — E785 Hyperlipidemia, unspecified: Secondary | ICD-10-CM | POA: Diagnosis not present

## 2022-05-03 DIAGNOSIS — J168 Pneumonia due to other specified infectious organisms: Secondary | ICD-10-CM | POA: Diagnosis not present

## 2022-05-03 DIAGNOSIS — R059 Cough, unspecified: Secondary | ICD-10-CM | POA: Diagnosis not present

## 2022-05-03 DIAGNOSIS — R778 Other specified abnormalities of plasma proteins: Secondary | ICD-10-CM | POA: Diagnosis not present

## 2022-05-03 DIAGNOSIS — I509 Heart failure, unspecified: Secondary | ICD-10-CM | POA: Diagnosis not present

## 2022-05-03 DIAGNOSIS — M549 Dorsalgia, unspecified: Secondary | ICD-10-CM | POA: Diagnosis not present

## 2022-05-03 DIAGNOSIS — T68XXXA Hypothermia, initial encounter: Secondary | ICD-10-CM | POA: Diagnosis not present

## 2022-05-03 DIAGNOSIS — R0602 Shortness of breath: Secondary | ICD-10-CM | POA: Diagnosis not present

## 2022-05-03 DIAGNOSIS — M545 Low back pain, unspecified: Secondary | ICD-10-CM | POA: Diagnosis not present

## 2022-05-03 DIAGNOSIS — R000 Tachycardia, unspecified: Secondary | ICD-10-CM | POA: Diagnosis not present

## 2022-05-03 DIAGNOSIS — Z743 Need for continuous supervision: Secondary | ICD-10-CM | POA: Diagnosis not present

## 2022-05-03 DIAGNOSIS — M25552 Pain in left hip: Secondary | ICD-10-CM | POA: Diagnosis not present

## 2022-05-03 DIAGNOSIS — R1084 Generalized abdominal pain: Secondary | ICD-10-CM | POA: Diagnosis not present

## 2022-05-04 DIAGNOSIS — E875 Hyperkalemia: Secondary | ICD-10-CM | POA: Diagnosis not present

## 2022-05-04 DIAGNOSIS — R918 Other nonspecific abnormal finding of lung field: Secondary | ICD-10-CM | POA: Diagnosis not present

## 2022-05-04 DIAGNOSIS — I700 Atherosclerosis of aorta: Secondary | ICD-10-CM | POA: Diagnosis not present

## 2022-05-04 DIAGNOSIS — G909 Disorder of the autonomic nervous system, unspecified: Secondary | ICD-10-CM | POA: Diagnosis not present

## 2022-05-04 DIAGNOSIS — Z681 Body mass index (BMI) 19 or less, adult: Secondary | ICD-10-CM | POA: Diagnosis not present

## 2022-05-04 DIAGNOSIS — Z66 Do not resuscitate: Secondary | ICD-10-CM | POA: Diagnosis not present

## 2022-05-04 DIAGNOSIS — Z593 Problems related to living in residential institution: Secondary | ICD-10-CM | POA: Diagnosis not present

## 2022-05-04 DIAGNOSIS — E440 Moderate protein-calorie malnutrition: Secondary | ICD-10-CM | POA: Diagnosis not present

## 2022-05-04 DIAGNOSIS — J189 Pneumonia, unspecified organism: Secondary | ICD-10-CM | POA: Diagnosis not present

## 2022-05-04 DIAGNOSIS — I5041 Acute combined systolic (congestive) and diastolic (congestive) heart failure: Secondary | ICD-10-CM | POA: Diagnosis not present

## 2022-05-04 DIAGNOSIS — J90 Pleural effusion, not elsewhere classified: Secondary | ICD-10-CM | POA: Diagnosis not present

## 2022-05-04 DIAGNOSIS — I051 Rheumatic mitral insufficiency: Secondary | ICD-10-CM | POA: Diagnosis not present

## 2022-05-04 DIAGNOSIS — I480 Paroxysmal atrial fibrillation: Secondary | ICD-10-CM | POA: Diagnosis not present

## 2022-05-04 DIAGNOSIS — N179 Acute kidney failure, unspecified: Secondary | ICD-10-CM | POA: Diagnosis not present

## 2022-05-04 DIAGNOSIS — J168 Pneumonia due to other specified infectious organisms: Secondary | ICD-10-CM | POA: Diagnosis not present

## 2022-05-04 DIAGNOSIS — R590 Localized enlarged lymph nodes: Secondary | ICD-10-CM | POA: Diagnosis not present

## 2022-05-04 DIAGNOSIS — I110 Hypertensive heart disease with heart failure: Secondary | ICD-10-CM | POA: Diagnosis not present

## 2022-05-04 DIAGNOSIS — I5023 Acute on chronic systolic (congestive) heart failure: Secondary | ICD-10-CM | POA: Diagnosis not present

## 2022-05-04 DIAGNOSIS — E872 Acidosis, unspecified: Secondary | ICD-10-CM | POA: Diagnosis not present

## 2022-05-04 DIAGNOSIS — R739 Hyperglycemia, unspecified: Secondary | ICD-10-CM | POA: Diagnosis not present

## 2022-05-04 DIAGNOSIS — Z20822 Contact with and (suspected) exposure to covid-19: Secondary | ICD-10-CM | POA: Diagnosis not present

## 2022-05-04 DIAGNOSIS — R627 Adult failure to thrive: Secondary | ICD-10-CM | POA: Diagnosis not present

## 2022-05-04 DIAGNOSIS — I2510 Atherosclerotic heart disease of native coronary artery without angina pectoris: Secondary | ICD-10-CM | POA: Diagnosis not present

## 2022-05-04 DIAGNOSIS — I5189 Other ill-defined heart diseases: Secondary | ICD-10-CM | POA: Diagnosis not present

## 2022-05-04 DIAGNOSIS — J810 Acute pulmonary edema: Secondary | ICD-10-CM | POA: Diagnosis not present

## 2022-05-04 DIAGNOSIS — R6520 Severe sepsis without septic shock: Secondary | ICD-10-CM | POA: Diagnosis not present

## 2022-05-04 DIAGNOSIS — I252 Old myocardial infarction: Secondary | ICD-10-CM | POA: Diagnosis not present

## 2022-05-04 DIAGNOSIS — Z79899 Other long term (current) drug therapy: Secondary | ICD-10-CM | POA: Diagnosis not present

## 2022-05-04 DIAGNOSIS — E039 Hypothyroidism, unspecified: Secondary | ICD-10-CM | POA: Diagnosis not present

## 2022-05-04 DIAGNOSIS — I509 Heart failure, unspecified: Secondary | ICD-10-CM | POA: Diagnosis not present

## 2022-05-04 DIAGNOSIS — D649 Anemia, unspecified: Secondary | ICD-10-CM | POA: Diagnosis not present

## 2022-05-04 DIAGNOSIS — Z79890 Hormone replacement therapy (postmenopausal): Secondary | ICD-10-CM | POA: Diagnosis not present

## 2022-05-04 DIAGNOSIS — J9600 Acute respiratory failure, unspecified whether with hypoxia or hypercapnia: Secondary | ICD-10-CM | POA: Diagnosis not present

## 2022-05-04 DIAGNOSIS — E8779 Other fluid overload: Secondary | ICD-10-CM | POA: Diagnosis not present

## 2022-05-04 DIAGNOSIS — A419 Sepsis, unspecified organism: Secondary | ICD-10-CM | POA: Diagnosis not present

## 2022-05-04 DIAGNOSIS — J9601 Acute respiratory failure with hypoxia: Secondary | ICD-10-CM | POA: Diagnosis not present

## 2022-05-04 DIAGNOSIS — I4892 Unspecified atrial flutter: Secondary | ICD-10-CM | POA: Diagnosis not present

## 2022-05-04 DIAGNOSIS — R000 Tachycardia, unspecified: Secondary | ICD-10-CM | POA: Diagnosis not present

## 2022-05-04 DIAGNOSIS — I21A1 Myocardial infarction type 2: Secondary | ICD-10-CM | POA: Diagnosis not present

## 2022-05-04 DIAGNOSIS — R778 Other specified abnormalities of plasma proteins: Secondary | ICD-10-CM | POA: Diagnosis not present

## 2022-05-04 DIAGNOSIS — R0602 Shortness of breath: Secondary | ICD-10-CM | POA: Diagnosis not present

## 2022-05-04 DIAGNOSIS — I517 Cardiomegaly: Secondary | ICD-10-CM | POA: Diagnosis not present

## 2022-05-04 DIAGNOSIS — R7989 Other specified abnormal findings of blood chemistry: Secondary | ICD-10-CM | POA: Diagnosis not present

## 2022-05-04 DIAGNOSIS — I5021 Acute systolic (congestive) heart failure: Secondary | ICD-10-CM | POA: Diagnosis not present

## 2022-05-07 DIAGNOSIS — J9601 Acute respiratory failure with hypoxia: Secondary | ICD-10-CM | POA: Diagnosis not present

## 2022-05-07 DIAGNOSIS — I5041 Acute combined systolic (congestive) and diastolic (congestive) heart failure: Secondary | ICD-10-CM | POA: Diagnosis not present

## 2022-05-07 DIAGNOSIS — R918 Other nonspecific abnormal finding of lung field: Secondary | ICD-10-CM | POA: Diagnosis not present

## 2022-05-07 DIAGNOSIS — J810 Acute pulmonary edema: Secondary | ICD-10-CM | POA: Diagnosis not present

## 2022-05-07 DIAGNOSIS — J189 Pneumonia, unspecified organism: Secondary | ICD-10-CM | POA: Diagnosis not present

## 2022-05-07 DIAGNOSIS — J90 Pleural effusion, not elsewhere classified: Secondary | ICD-10-CM | POA: Diagnosis not present

## 2022-05-26 DEATH — deceased
